# Patient Record
Sex: Female | Born: 1965 | Race: White | Hispanic: No | Smoking: Former smoker
Health system: Southern US, Community
[De-identification: ages and names within clinical notes are randomized; demographics above are authoritative.]

## PROBLEM LIST (undated history)

## (undated) DIAGNOSIS — I10 Essential (primary) hypertension: Secondary | ICD-10-CM

## (undated) DIAGNOSIS — R569 Unspecified convulsions: Secondary | ICD-10-CM

## (undated) DIAGNOSIS — E119 Type 2 diabetes mellitus without complications: Secondary | ICD-10-CM

## (undated) DIAGNOSIS — E785 Hyperlipidemia, unspecified: Secondary | ICD-10-CM

## (undated) HISTORY — PX: ABDOMINAL HYSTERECTOMY: SHX81

## (undated) HISTORY — DX: Type 2 diabetes mellitus without complications: E11.9

## (undated) HISTORY — PX: CHOLECYSTECTOMY: SHX55

---

## 2000-01-21 ENCOUNTER — Other Ambulatory Visit: Admission: RE | Admit: 2000-01-21 | Discharge: 2000-01-21 | Payer: Self-pay | Admitting: Obstetrics and Gynecology

## 2000-07-02 ENCOUNTER — Emergency Department (HOSPITAL_COMMUNITY): Admission: EM | Admit: 2000-07-02 | Discharge: 2000-07-02 | Payer: Self-pay | Admitting: Emergency Medicine

## 2000-07-02 ENCOUNTER — Encounter: Payer: Self-pay | Admitting: Emergency Medicine

## 2001-02-14 ENCOUNTER — Other Ambulatory Visit: Admission: RE | Admit: 2001-02-14 | Discharge: 2001-02-14 | Payer: Self-pay | Admitting: Obstetrics and Gynecology

## 2001-02-14 ENCOUNTER — Encounter (INDEPENDENT_AMBULATORY_CARE_PROVIDER_SITE_OTHER): Payer: Self-pay

## 2001-02-21 ENCOUNTER — Encounter: Payer: Self-pay | Admitting: Obstetrics and Gynecology

## 2001-02-26 ENCOUNTER — Ambulatory Visit (HOSPITAL_COMMUNITY): Admission: RE | Admit: 2001-02-26 | Discharge: 2001-02-26 | Payer: Self-pay | Admitting: Obstetrics and Gynecology

## 2002-02-26 ENCOUNTER — Other Ambulatory Visit: Admission: RE | Admit: 2002-02-26 | Discharge: 2002-02-26 | Payer: Self-pay | Admitting: Obstetrics and Gynecology

## 2003-02-07 ENCOUNTER — Encounter: Admission: RE | Admit: 2003-02-07 | Discharge: 2003-02-07 | Payer: Self-pay | Admitting: Gastroenterology

## 2003-02-07 ENCOUNTER — Encounter: Payer: Self-pay | Admitting: Gastroenterology

## 2003-03-25 ENCOUNTER — Other Ambulatory Visit: Admission: RE | Admit: 2003-03-25 | Discharge: 2003-03-25 | Payer: Self-pay | Admitting: Obstetrics and Gynecology

## 2003-07-22 ENCOUNTER — Ambulatory Visit (HOSPITAL_COMMUNITY): Admission: RE | Admit: 2003-07-22 | Discharge: 2003-07-22 | Payer: Self-pay | Admitting: Gastroenterology

## 2003-09-10 ENCOUNTER — Ambulatory Visit (HOSPITAL_COMMUNITY): Admission: RE | Admit: 2003-09-10 | Discharge: 2003-09-11 | Payer: Self-pay | Admitting: Surgery

## 2003-09-10 ENCOUNTER — Encounter (INDEPENDENT_AMBULATORY_CARE_PROVIDER_SITE_OTHER): Payer: Self-pay | Admitting: Specialist

## 2004-05-18 ENCOUNTER — Other Ambulatory Visit: Admission: RE | Admit: 2004-05-18 | Discharge: 2004-05-18 | Payer: Self-pay | Admitting: Obstetrics and Gynecology

## 2004-12-07 ENCOUNTER — Ambulatory Visit (HOSPITAL_COMMUNITY): Admission: RE | Admit: 2004-12-07 | Discharge: 2004-12-07 | Payer: Self-pay | Admitting: Gastroenterology

## 2005-01-27 ENCOUNTER — Emergency Department (HOSPITAL_COMMUNITY): Admission: EM | Admit: 2005-01-27 | Discharge: 2005-01-27 | Payer: Self-pay | Admitting: Emergency Medicine

## 2005-05-19 ENCOUNTER — Other Ambulatory Visit: Admission: RE | Admit: 2005-05-19 | Discharge: 2005-05-19 | Payer: Self-pay | Admitting: *Deleted

## 2006-04-10 ENCOUNTER — Encounter (INDEPENDENT_AMBULATORY_CARE_PROVIDER_SITE_OTHER): Payer: Self-pay | Admitting: *Deleted

## 2006-04-10 ENCOUNTER — Ambulatory Visit (HOSPITAL_COMMUNITY): Admission: RE | Admit: 2006-04-10 | Discharge: 2006-04-11 | Payer: Self-pay | Admitting: Obstetrics and Gynecology

## 2006-05-12 ENCOUNTER — Encounter: Admission: RE | Admit: 2006-05-12 | Discharge: 2006-05-12 | Payer: Self-pay | Admitting: Obstetrics and Gynecology

## 2007-04-20 ENCOUNTER — Emergency Department (HOSPITAL_COMMUNITY): Admission: EM | Admit: 2007-04-20 | Discharge: 2007-04-20 | Payer: Self-pay | Admitting: Emergency Medicine

## 2007-11-26 ENCOUNTER — Emergency Department (HOSPITAL_COMMUNITY): Admission: EM | Admit: 2007-11-26 | Discharge: 2007-11-26 | Payer: Self-pay | Admitting: Emergency Medicine

## 2008-04-28 ENCOUNTER — Other Ambulatory Visit: Admission: RE | Admit: 2008-04-28 | Discharge: 2008-04-28 | Payer: Self-pay | Admitting: Family Medicine

## 2009-01-22 ENCOUNTER — Emergency Department (HOSPITAL_COMMUNITY): Admission: EM | Admit: 2009-01-22 | Discharge: 2009-01-23 | Payer: Self-pay | Admitting: Emergency Medicine

## 2009-01-25 ENCOUNTER — Emergency Department (HOSPITAL_COMMUNITY): Admission: EM | Admit: 2009-01-25 | Discharge: 2009-01-26 | Payer: Self-pay | Admitting: Emergency Medicine

## 2009-04-23 ENCOUNTER — Emergency Department (HOSPITAL_COMMUNITY): Admission: EM | Admit: 2009-04-23 | Discharge: 2009-04-23 | Payer: Self-pay | Admitting: Emergency Medicine

## 2009-06-04 ENCOUNTER — Emergency Department (HOSPITAL_COMMUNITY): Admission: EM | Admit: 2009-06-04 | Discharge: 2009-06-05 | Payer: Self-pay | Admitting: Emergency Medicine

## 2009-09-08 ENCOUNTER — Emergency Department (HOSPITAL_COMMUNITY): Admission: EM | Admit: 2009-09-08 | Discharge: 2009-09-08 | Payer: Self-pay | Admitting: Emergency Medicine

## 2010-01-24 ENCOUNTER — Emergency Department (HOSPITAL_COMMUNITY): Admission: EM | Admit: 2010-01-24 | Discharge: 2010-01-25 | Payer: Self-pay | Admitting: Emergency Medicine

## 2010-08-06 ENCOUNTER — Inpatient Hospital Stay (HOSPITAL_COMMUNITY): Admission: EM | Admit: 2010-08-06 | Discharge: 2010-08-09 | Payer: Self-pay | Admitting: Emergency Medicine

## 2010-08-16 ENCOUNTER — Emergency Department (HOSPITAL_COMMUNITY)
Admission: AC | Admit: 2010-08-16 | Discharge: 2010-08-16 | Payer: Self-pay | Source: Home / Self Care | Admitting: Dermatology

## 2010-11-23 LAB — CBC
HCT: 33.9 % — ABNORMAL LOW (ref 36.0–46.0)
HCT: 34.6 % — ABNORMAL LOW (ref 36.0–46.0)
Hemoglobin: 11 g/dL — ABNORMAL LOW (ref 12.0–15.0)
Hemoglobin: 11.1 g/dL — ABNORMAL LOW (ref 12.0–15.0)
Hemoglobin: 11.5 g/dL — ABNORMAL LOW (ref 12.0–15.0)
MCH: 28.6 pg (ref 26.0–34.0)
MCH: 29.4 pg (ref 26.0–34.0)
MCHC: 32 g/dL (ref 30.0–36.0)
MCHC: 32.4 g/dL (ref 30.0–36.0)
MCHC: 33.2 g/dL (ref 30.0–36.0)
MCV: 88.5 fL (ref 78.0–100.0)
RBC: 3.76 MIL/uL — ABNORMAL LOW (ref 3.87–5.11)
RDW: 13.8 % (ref 11.5–15.5)
RDW: 13.9 % (ref 11.5–15.5)
WBC: 12 10*3/uL — ABNORMAL HIGH (ref 4.0–10.5)

## 2010-11-23 LAB — GLUCOSE, CAPILLARY: Glucose-Capillary: 115 mg/dL — ABNORMAL HIGH (ref 70–99)

## 2010-11-23 LAB — BASIC METABOLIC PANEL
BUN: 12 mg/dL (ref 6–23)
BUN: 4 mg/dL — ABNORMAL LOW (ref 6–23)
BUN: 7 mg/dL (ref 6–23)
CO2: 23 mEq/L (ref 19–32)
CO2: 25 mEq/L (ref 19–32)
Calcium: 8.7 mg/dL (ref 8.4–10.5)
Chloride: 107 mEq/L (ref 96–112)
Chloride: 107 mEq/L (ref 96–112)
Creatinine, Ser: 0.64 mg/dL (ref 0.4–1.2)
GFR calc non Af Amer: >60 mL/min (ref >60)
GFR calc non Af Amer: >60 mL/min (ref >60)
Glucose, Bld: 150 mg/dL — ABNORMAL HIGH (ref 70–99)
Glucose, Bld: 166 mg/dL — ABNORMAL HIGH (ref 70–99)
Potassium: 3.1 mEq/L — ABNORMAL LOW (ref 3.5–5.1)
Potassium: 3.7 mEq/L (ref 3.5–5.1)
Sodium: 136 mEq/L (ref 135–145)

## 2010-11-23 LAB — COMPREHENSIVE METABOLIC PANEL
ALT: 12 U/L (ref 0–35)
Alkaline Phosphatase: 74 U/L (ref 39–117)
CO2: 24 mEq/L (ref 19–32)
Chloride: 108 mEq/L (ref 96–112)
GFR calc non Af Amer: >60 mL/min (ref >60)
Glucose, Bld: 100 mg/dL — ABNORMAL HIGH (ref 70–99)
Potassium: 3.6 mEq/L (ref 3.5–5.1)
Sodium: 137 mEq/L (ref 135–145)
Total Bilirubin: 0.5 mg/dL (ref 0.3–1.2)

## 2010-11-23 LAB — MRSA PCR SCREENING: MRSA by PCR: NEGATIVE

## 2010-11-23 LAB — MAGNESIUM: Magnesium: 1.9 mg/dL (ref 1.5–2.5)

## 2010-12-18 LAB — POCT I-STAT, CHEM 8
BUN: 5 mg/dL — ABNORMAL LOW (ref 6–23)
Calcium, Ion: 1.1 mmol/L — ABNORMAL LOW (ref 1.12–1.32)
Hemoglobin: 11.9 g/dL — ABNORMAL LOW (ref 12.0–15.0)
Sodium: 138 mEq/L (ref 135–145)
TCO2: 20 mmol/L (ref 0–100)

## 2010-12-18 LAB — ETHANOL: Alcohol, Ethyl (B): <5 mg/dL (ref 0–10)

## 2010-12-21 LAB — COMPREHENSIVE METABOLIC PANEL
ALT: 22 U/L (ref 0–35)
Alkaline Phosphatase: 80 U/L (ref 39–117)
BUN: 4 mg/dL — ABNORMAL LOW (ref 6–23)
CO2: 25 mEq/L (ref 19–32)
Chloride: 107 mEq/L (ref 96–112)
GFR calc non Af Amer: >60 mL/min (ref >60)
Glucose, Bld: 126 mg/dL — ABNORMAL HIGH (ref 70–99)
Potassium: 3.4 mEq/L — ABNORMAL LOW (ref 3.5–5.1)
Sodium: 136 mEq/L (ref 135–145)
Total Bilirubin: 0.5 mg/dL (ref 0.3–1.2)

## 2010-12-21 LAB — BASIC METABOLIC PANEL
BUN: 6 mg/dL (ref 6–23)
Calcium: 8.5 mg/dL (ref 8.4–10.5)
Chloride: 107 mEq/L (ref 96–112)
Creatinine, Ser: 0.59 mg/dL (ref 0.4–1.2)

## 2010-12-21 LAB — URINALYSIS, ROUTINE W REFLEX MICROSCOPIC
Glucose, UA: NEGATIVE mg/dL
Hgb urine dipstick: NEGATIVE
Ketones, ur: NEGATIVE mg/dL
Protein, ur: NEGATIVE mg/dL
Urobilinogen, UA: 0.2 mg/dL (ref 0.0–1.0)

## 2010-12-21 LAB — PREGNANCY, URINE: Preg Test, Ur: NEGATIVE

## 2010-12-21 LAB — URINE CULTURE

## 2010-12-21 LAB — LACTIC ACID, PLASMA: Lactic Acid, Venous: 1.7 mmol/L (ref 0.5–2.2)

## 2010-12-21 LAB — CBC
HCT: 33.5 % — ABNORMAL LOW (ref 36.0–46.0)
Hemoglobin: 11.6 g/dL — ABNORMAL LOW (ref 12.0–15.0)
RBC: 3.91 MIL/uL (ref 3.87–5.11)

## 2010-12-21 LAB — DIFFERENTIAL
Basophils Absolute: 0.1 10*3/uL (ref 0.0–0.1)
Basophils Relative: 1 % (ref 0–1)
Eosinophils Absolute: 0.1 10*3/uL (ref 0.0–0.7)
Monocytes Relative: 5 % (ref 3–12)
Neutro Abs: 8.8 10*3/uL — ABNORMAL HIGH (ref 1.7–7.7)
Neutrophils Relative %: 82 % — ABNORMAL HIGH (ref 43–77)

## 2011-01-28 NOTE — Op Note (Signed)
[name], [name]                ACCOUNT NO.:  [id number]   MEDICAL RECORD NO.:  [id number]          PATIENT TYPE:  AMB   LOCATION:  ENDO                         FACILITY:  [facility]   PHYSICIAN:  James L. Malon Kindle., M.D.DATE OF BIRTH:  1965-09-17   DATE OF PROCEDURE:  12/07/2004  DATE OF DISCHARGE:                                 OPERATIVE REPORT   PROCEDURE:  Esophagogastroduodenoscopy.   ENDOSCOPIST:  Llana Aliment. Randa Evens, M.D.   MEDICATIONS:  Cetacaine spray, fentanyl 60 mcg, Versed 7 mg IV.   INDICATION:  Persistent dyspepsia.   DESCRIPTION OF PROCEDURE:  The procedure had been explained to the patient  and consent obtained.  With the patient in the left lateral decubitus  position, the Olympus scope was inserted and advanced.  The stomach was  entered and pylorus identified and passed.  The duodenum including the bulb  and second portion were seen well and were unremarkable.  The scope was  withdrawn back into the stomach and the pyloric channel was normal.  The  antrum and body of the stomach were normal with no ulceration or  inflammation.  The fundus and cardia were seen on the retroflexed view and  appeared to be normal.  The patient did have a small hiatal hernia.  The GE  junction was patent with a reddened distal esophagus.  The proximal  esophagus was endoscopically normal.  The patient tolerated the procedure  well.   ASSESSMENT:  Probable gastroesophageal reflux disease.  The patient's  bloating may be due to delayed emptying.   PLAN:  We will give her a trial of Reglan 10 mg b.i.d.  We will continue the  Nexium due to reflux.  She will be seen back in the office in 6-8 weeks.      JLE/MEDQ  D:  12/07/2004  T:  12/07/2004  Job:  [id number]   cc:   Dario Guardian, M.D.  [address]  [place]  [place] [address]  Fax: [phone]

## 2011-01-28 NOTE — Op Note (Signed)
[name], [name]                ACCOUNT NO.:  [id number]   MEDICAL RECORD NO.:  [id number]          PATIENT TYPE:  OIB   LOCATION:  9311                          FACILITY:  [facility]   PHYSICIAN:  Cynthia P. Romine, M.D.DATE OF BIRTH:  1966/07/17   DATE OF PROCEDURE:  04/10/2006  DATE OF DISCHARGE:                                 OPERATIVE REPORT   PREOPERATIVE DIAGNOSIS:  Menorrhagia, left ovarian cyst.   POSTOPERATIVE DIAGNOSIS:  Menorrhagia, left ovarian cyst, pathology pending.   PROCEDURE:  Total vaginal hysterectomy, left ovarian cystectomy   SURGEON:  Dr. Arline Asp Romine.   ASSISTANT:  Dr. Leda Quail.   ANESTHESIA:  General by LMA.   ESTIMATED BLOOD LOSS:  150 mL.   COMPLICATIONS:  None.   PROCEDURE:  The patient was taken to the operating room and after induction  of adequate general anesthesia was placed in dorsal lithotomy position and  prepped and draped in usual fashion.  Posterior weighted and an anterior  Deaver were placed, and the cervix was grasped on its anterior lip with a  Jacobs tenaculum.  Mucosa over the cervix was infiltrated with 1% lidocaine  with epinephrine.  Mucosa was then incised from 9 to 3 o'clock and dissected  sharply off the cervix.  The bladder was pushed anteriorly.  Attention was  then turned posteriorly.  A posterior colpotomy incision was made and the  banana retractor was placed.  Uterosacral ligaments were clamped, cut and  tied on each side.  The hysterectomy continued up the cardinal ligaments,  clamping, cutting and tying on each side using zero Vicryl.  Attention was  then turned anteriorly.  The peritoneum was identified, elevated and entered  atraumatically and the Deaver was placed in the intraperitoneal space.  The  pedicles containing the uterine arteries were then clamped on each side, cut  and doubly tied with 0 Vicryl.  Hysterectomy continued up the broad  ligament, clamping, cutting and tying in sequence.  The fundus was  then  delivered into the posterior cul-de-sac, and the pedicle containing the  tube, the round and utero-ovarian ligaments were clamped, cut and doubly  tied, first with a free tie and then with a fore-and-aft suture of 0 Vicryl.  The specimen was sent to pathology.  The ovaries were inspected.  The right  tube and ovary appeared normal.  The left tube was normal on the left ovary.  There had been a persistent ovarian cyst for at least a year that measured  about 2.3 cm on ultrasound.  This was visualized and appeared clear and  thinned wall.  The cyst was opened with a small puncture wound with a knife.  The cyst wall was dissected off the cortex of the ovary and sent to  pathology.  The cyst was consistent with approximately 2.3 cm as seen on  ultrasound.  After removing the cyst wall, there was no bleeding from the  bed of the cyst or on the ovarian cortex.  Therefore, no sutures were  placed.  The pedicles were inspected and found to be hemostatic.  The  sutures  were cut, and the ovaries were allowed to retract.  The vaginal cuff  was run with a running locking suture of 0 Vicryl going from 2 to 10  o'clock.  An anti-enterocele stitch was placed by going into the posterior  peritoneum, suturing the left uterosacral ligaments, skimming across the  posterior peritoneum, between the right uterosacral ligaments and coming out  again in the midline and tying these two together.  Wound was irrigated.  All the pedicles were inspected and were hemostatic.  The cuff was closed  with figure-of-eight sutures of 0 Vicryl.  The cuff was  irrigated.  Bladder was drained.  Instruments were removed from the vagina,  and the procedure was terminated.  The patient tolerated it well.  Sponge,  needle and instrument counts were correct x3.  The patient was taken to the  recovery room in satisfactory condition.           ______________________________  Edwena Felty. Romine, M.D.     CPR/MEDQ  D:   04/10/2006  T:  04/11/2006  Job:  [id number]

## 2011-01-28 NOTE — Op Note (Signed)
[facility]  Patient:    [name], [name]                       MRN: [id number] Proc. Date: 02/26/01 Adm. Date:  [id number] Attending:  Brynda Peon                           Operative Report  PREOPERATIVE DIAGNOSIS:  Multiparity with desire for attempt at permanent surgical sterilization.  POSTOPERATIVE DIAGNOSIS:  Multiparity with desire for attempt at permanent surgical sterilization.  PROCEDURE:  Laparoscopic Falope-ring bilateral tubal sterilization.  SURGEON:  Cynthia P. Ashley Royalty, M.D.  ANESTHESIA:  General endotracheal.  ESTIMATED BLOOD LOSS:  Minimal.  COMPLICATIONS:  None.  DESCRIPTION OF PROCEDURE:  The patient was taken to the operating room and after the induction of adequate general endotracheal anesthesia, was placed in the dorsal lithotomy position and prepped and draped in the usual fashion. The bladder was drained with a red rubber catheter.  A Hulka uterine manipulator was placed.  A subumbilical incision was made, and the Veress needle was inserted into the peritoneal space.  Proper placement was tested by noting a negative aspirate and then free flow of saline through the Veress needle, again with a negative aspirate and then by noting the response of a drop of saline placed at the hub of the Veress needle to negative pressure as the abdominal wall was elevated.  Pneumoperitoneum was created with 2.4 liters of CO2 using the automatic insufflator.  A disposable 10-11 mm trocar was inserted into the peritoneal space and its proper placement noted with the laparoscope.  The pelvis was inspected.  There was a question of endometriosis preoperatively because of the patients family history and because she had been having significant dysmenorrhea and intermenstrual bleeding; however, no endometriosis was noted in the pelvis.  The tubes, ovaries, and uterus all appeared normal, as did the anterior and posterior cul-de-sac.  A  small suprapubic incision was made, and the 8 mm trocar was then inserted under direct visualization.  The right fallopian tube was identified and traced to its fimbriated end.  A ______  portion was elevated, and a Falope-ring was placed.  A good knuckle of tube was noted to be contained within the ring, and no bleeding was encountered.  The procedure was repeated on the patients left, identifying the tube and tracing it to its fimbriated end.  A ______ portion was elevated, and a Falope-ring was placed.  A good knuckle of tube was noted to be contained within the ring, and no bleeding was encountered. Good blanching was noted.  After inspecting the tubes again and finding proper placement of the rings, the instruments removed from the abdomen, and the pneumoperitoneum was allowed to escape, and the incisions were closed subcuticularly with 3-0 plain gut.  The instruments were removed from the vagina, and the procedure was terminated.  The patient tolerated it well and went in satisfactory condition to postanesthesia recovery. DD:  02/26/01 TD:  02/26/01 Job: [id number]

## 2011-01-28 NOTE — Discharge Summary (Signed)
[name], [name]                ACCOUNT NO.:  [id number]   MEDICAL RECORD NO.:  [id number]          PATIENT TYPE:  OIB   LOCATION:  9311                          FACILITY:  [facility]   PHYSICIAN:  Cynthia P. Romine, M.D.DATE OF BIRTH:  October 16, 1965   DATE OF ADMISSION:  04/10/2006  DATE OF DISCHARGE:  04/11/2006                                 DISCHARGE SUMMARY   DISCHARGE DIAGNOSIS:  Leiomyoma and endometriosis.   HISTORY:  This is a 45 year old married white female, gravida 2, para 2 with  bleeding that was prolonged throughout the month and severe dysmenorrhea.  Her ultrasound showed her uterus to be 11 x 9 x 6 cm.  Her right ovary was  normal.  Her left ovary had a 2.3 cm clear cyst.  She tried medical  management but had been unsuccessful and she was admitted to undergo a  vaginal hysterectomy.  On April 10, 2006, she underwent a total vaginal  hysterectomy and a left ovarian cystectomy, estimated blood loss was 150 mL.  There were no complications.  Postoperatively she did very well.  She was  afebrile and in stable condition for discharge on postoperative day #1.  She  was given full discharge instructions regarding pelvic rest and follow-up in  the office.  Pathology did show a 2.5 cm leiomyoma and endometriosis on the  serosal surface of the uterus.  The left ovarian cyst was a benign cyst with  features with a follicular cyst.   LABORATORY DATA:  Admission hemoglobin and hematocrit were 11 and 34.  On  discharge 10.8 and 32.7.           ______________________________  Edwena Felty. Romine, M.D.     CPR/MEDQ  D:  05/08/2006  T:  05/09/2006  Job:  [id number]

## 2011-01-28 NOTE — Op Note (Signed)
NAME:  [name], [name]                          ACCOUNT NO.:  [id number]   MEDICAL RECORD NO.:  [id number]                   PATIENT TYPE:  OIB   LOCATION:  2550                                 FACILITY:  [facility]   PHYSICIAN:  Thornton Park. Daphine Deutscher, M.D.             DATE OF BIRTH:  21-Feb-1966   DATE OF PROCEDURE:  09/10/2003  DATE OF DISCHARGE:                                 OPERATIVE REPORT   PREOPERATIVE DIAGNOSIS:  Biliary dyskinesia.   POSTOPERATIVE DIAGNOSIS:  Biliary dyskinesia.   OPERATION PERFORMED:  Laparoscopic cholecystectomy with intraoperative  cholangiogram.   SURGEON:  Thornton Park. Daphine Deutscher, M.D.   ANESTHESIA:  General endotracheal.   INDICATIONS FOR PROCEDURE:  [name] is a 45 year old lady with  recurrent bouts of midepigastric pain and nausea.   DESCRIPTION OF PROCEDURE:  She was taken to room 17 and given general  anesthesia.  The abdomen was prepped with Betadine and draped sterilely.  A  longitudinal incision was made down in the umbilicus and there I found a  small little umbilical hernia which I used to open inferiorly to create a  space for the Hasson cannula.  The abdomen was insufflated and three trocars  were placed in the upper abdomen.  At the end of the case, these were all  infiltrated with 0.5% Marcaine.  The gallbladder was kind of a robin's egg  blue but it had little areas that could well be cholesterolosis that were  visible through the wall.  It was grasped and elevated and grasped on the  infundibulum and dissected out of Calot's triangle.  I delineated the cystic  artery and cystic duct and put a clip up on the gallbladder and incised the  cystic duct and did a dynamic cholangiogram which showed a long skinny  cystic duct going into the common bile duct with retrograde filling up into  the liver and free flow into the duodenum.  For this I did use noniodinated  contrast material.  The patient seemed to tolerate this well.  The cystic  duct was  then triple clipped, divided.  The cystic artery was double clipped  and divided.  The gallbladder was removed from the gallbladder bed with the  hook cautery without entering it.  No bleeding or bile leaks were noted from  the gallbladder bed.  Optics for this with a 0 degree camera were okay but  not as crisp as usual.  The gallbladder was removed.  The gallbladder bed  was reinspected and no bleeding or bile leaks were noted.  On one view in  the pelvis with the 0 degree scope, it appeared she possibly might have an  early right inguinal hernia.  The umbilical defect was repaired with 0  Vicryl under laparoscopic vision and this seemed to obliterate that defect.  Port sites were injected with 0.5%  Marcaine.  There was some bleeding in the medial 5 mm  port and this was  controlled with electrocautery and a suture.  Benzoin and Steri-Strips were  used for the skin.  The patient tolerated the procedure well and was taken  to the recovery room in satisfactory condition.                                               Thornton Park Daphine Deutscher, M.D.    MBM/MEDQ  D:  09/10/2003  T:  09/10/2003  Job:  [id number]   cc:   Dario Guardian, M.D.  [address]  [place]  [place] [address]  Fax: [phone]   Llana Aliment. Malon Kindle., M.D.  [address], [address]  [place]  [place] [address]  Fax: [phone]

## 2011-06-27 LAB — DIFFERENTIAL
Basophils Relative: 0
Eosinophils Absolute: 0.1
Lymphs Abs: 1.3
Neutro Abs: 12.2 — ABNORMAL HIGH
Neutrophils Relative %: 86 — ABNORMAL HIGH

## 2011-06-27 LAB — I-STAT 8, (EC8 V) (CONVERTED LAB)
Bicarbonate: 28.2 — ABNORMAL HIGH
Glucose, Bld: 114 — ABNORMAL HIGH
TCO2: 30
pH, Ven: 7.371 — ABNORMAL HIGH

## 2011-06-27 LAB — CBC
MCV: 80.1
Platelets: 422 — ABNORMAL HIGH
WBC: 14.2 — ABNORMAL HIGH

## 2011-06-27 LAB — LIPASE, BLOOD: Lipase: 19

## 2011-06-27 LAB — POCT I-STAT CREATININE: Operator id: 294501

## 2011-10-20 ENCOUNTER — Emergency Department (HOSPITAL_COMMUNITY)
Admission: EM | Admit: 2011-10-20 | Discharge: 2011-10-21 | Disposition: A | Discharge status: Home / Self Care | Payer: Self-pay | Attending: Emergency Medicine | Admitting: Emergency Medicine

## 2011-10-20 ENCOUNTER — Emergency Department (HOSPITAL_COMMUNITY): Payer: Self-pay

## 2011-10-20 ENCOUNTER — Other Ambulatory Visit: Payer: Self-pay

## 2011-10-20 ENCOUNTER — Encounter (HOSPITAL_COMMUNITY): Payer: Self-pay | Admitting: Emergency Medicine

## 2011-10-20 DIAGNOSIS — R404 Transient alteration of awareness: Secondary | ICD-10-CM | POA: Insufficient documentation

## 2011-10-20 DIAGNOSIS — Z79899 Other long term (current) drug therapy: Secondary | ICD-10-CM | POA: Insufficient documentation

## 2011-10-20 DIAGNOSIS — R32 Unspecified urinary incontinence: Secondary | ICD-10-CM | POA: Insufficient documentation

## 2011-10-20 DIAGNOSIS — R4182 Altered mental status, unspecified: Secondary | ICD-10-CM | POA: Insufficient documentation

## 2011-10-20 DIAGNOSIS — G40909 Epilepsy, unspecified, not intractable, without status epilepticus: Secondary | ICD-10-CM | POA: Insufficient documentation

## 2011-10-20 DIAGNOSIS — J45909 Unspecified asthma, uncomplicated: Secondary | ICD-10-CM | POA: Insufficient documentation

## 2011-10-20 DIAGNOSIS — F29 Unspecified psychosis not due to a substance or known physiological condition: Secondary | ICD-10-CM | POA: Insufficient documentation

## 2011-10-20 HISTORY — DX: Unspecified convulsions: R56.9

## 2011-10-20 LAB — URINALYSIS, ROUTINE W REFLEX MICROSCOPIC
Glucose, UA: NEGATIVE mg/dL
Leukocytes, UA: NEGATIVE
pH: 5 (ref 5.0–8.0)

## 2011-10-20 LAB — RAPID URINE DRUG SCREEN, HOSP PERFORMED
Amphetamines: NOT DETECTED
Benzodiazepines: NOT DETECTED
Cocaine: POSITIVE — AB
Opiates: NOT DETECTED
Tetrahydrocannabinol: POSITIVE — AB

## 2011-10-20 LAB — BASIC METABOLIC PANEL
BUN: 5 mg/dL — ABNORMAL LOW (ref 6–23)
CO2: 22 mEq/L (ref 19–32)
GFR calc non Af Amer: >90 mL/min (ref >90)
Glucose, Bld: 116 mg/dL — ABNORMAL HIGH (ref 70–99)
Potassium: 3.9 mEq/L (ref 3.5–5.1)
Sodium: 135 mEq/L (ref 135–145)

## 2011-10-20 MED ORDER — SODIUM CHLORIDE 0.9 % IV SOLN
1000.0000 mg | INTRAVENOUS | Status: AC
  Administered 2011-10-20: 1000 mg via INTRAVENOUS
  Filled 2011-10-20: qty 10

## 2011-10-20 MED ORDER — SODIUM CHLORIDE 0.9 % IV BOLUS (SEPSIS)
1000.0000 mL | Freq: Once | INTRAVENOUS | Status: AC
  Administered 2011-10-20: 1000 mL via INTRAVENOUS

## 2011-10-20 MED ORDER — LEVETIRACETAM 500 MG PO TABS: 500.0000 mg | ORAL_TABLET | Freq: Two times a day (BID) | ORAL | Status: DC

## 2011-10-20 NOTE — ED Provider Notes (Signed)
History     CSN: [id number]  Arrival date & time 10/20/11  2045   First MD Initiated Contact with Patient 10/20/11 2048      Chief Complaint  Patient presents with  . Seizures    (Consider location/radiation/quality/duration/timing/severity/associated sxs/prior treatment) HPI Comments: Patient presents via EMS after having a seizure at a bar with a friend. She says she only drank 2 beers tonight. She does have a history of seizure disorder is on Keppra. She states she'll take half her dose last night. She did have tongue biting urinary incontinence. She is somewhat confused but answering questions appropriately. She denies any headache, neck stiffness, fever, chest pain or shortness of breath.  Patient is a 46 y.o. female presenting with seizures.  Seizures  This is a chronic problem. The current episode started less than 1 hour ago. The problem has been resolved. There was 1 seizure. The most recent episode lasted less than 30 seconds. Associated symptoms include confusion. Pertinent negatives include no neck stiffness, no chest pain, no cough and no vomiting. Characteristics include bladder incontinence, loss of consciousness and bit tongue. The episode was witnessed. There was no sensation of an aura present. The seizures did not continue in the ED. Possible causes include missed seizure meds. There has been no fever.    Past Medical History  Diagnosis Date  . Seizures   . Asthma     History reviewed. No pertinent past surgical history.  History reviewed. No pertinent family history.  History  Substance Use Topics  . Smoking status: Never Smoker   . Smokeless tobacco: Not on file  . Alcohol Use: Yes     beer    OB History    Grav Para Term Preterm Abortions TAB SAB Ect Mult Living                  Review of Systems  Unable to perform ROS: Mental status change  Respiratory: Negative for cough.   Cardiovascular: Negative for chest pain.  Gastrointestinal: Negative  for vomiting.  Genitourinary: Positive for bladder incontinence.  Neurological: Positive for seizures and loss of consciousness.  Psychiatric/Behavioral: Positive for confusion.    Allergies  Iohexol and Other  Home Medications   Current Outpatient Rx  Name Route Sig Dispense Refill  . ALBUTEROL SULFATE HFA 108 (90 BASE) MCG/ACT IN AERS Inhalation Inhale 2 puffs into the lungs every 6 (six) hours as needed. For shortness of breath    . EPINEPHRINE 0.15 MG/0.3ML IJ DEVI Intramuscular Inject 0.15 mg into the muscle as needed. For allergic reaction    . LANSOPRAZOLE 15 MG PO CPDR Oral Take 15 mg by mouth daily.    Marland Kitchen LEVETIRACETAM 500 MG PO TABS Oral Take 500 mg by mouth 2 (two) times daily.    Marland Kitchen LISINOPRIL-HYDROCHLOROTHIAZIDE 20-12.5 MG PO TABS Oral Take 1 tablet by mouth daily.    Marland Kitchen OVER THE COUNTER MEDICATION Oral Take 1 tablet by mouth daily. airborn vitamin c    . PRAVASTATIN SODIUM 40 MG PO TABS Oral Take 40 mg by mouth daily.    . SERTRALINE HCL 100 MG PO TABS Oral Take 100 mg by mouth daily.    Marland Kitchen LEVETIRACETAM 500 MG PO TABS Oral Take 1 tablet (500 mg total) by mouth every 12 (twelve) hours. 60 tablet 0    BP 132/73  Pulse 79  Temp(Src) 98.8 F (37.1 C) (Oral)  Resp 18  SpO2 97%  Physical Exam  Constitutional: She appears well-developed and well-nourished.  No distress.       Somnolent but arousable, protecting airway answers questions appropriately  HENT:  Head: Normocephalic and atraumatic.  Mouth/Throat: Oropharynx is clear and moist. No oropharyngeal exudate.       Positive tongue biting  Eyes: Conjunctivae are normal. Pupils are equal, round, and reactive to light.  Neck: Normal range of motion. Neck supple.       No meningismus  Cardiovascular: Normal rate, regular rhythm and normal heart sounds.   Pulmonary/Chest: Effort normal and breath sounds normal. No respiratory distress.  Abdominal: Soft. Bowel sounds are normal. There is no tenderness. There is no rebound  and no guarding.  Musculoskeletal: Normal range of motion. She exhibits no edema and no tenderness.  Neurological: She is alert. No cranial nerve deficit.       Cranial nerve 2-12 intact.  5/5 strength throughout.  Follows commands appropriately.  Skin: Skin is warm and dry.    ED Course  Procedures (including critical care time)  Labs Reviewed  BASIC METABOLIC PANEL - Abnormal; Notable for the following:    Glucose, Bld 116 (*)    BUN 5 (*)    All other components within normal limits  URINALYSIS, ROUTINE W REFLEX MICROSCOPIC - Abnormal; Notable for the following:    Hgb urine dipstick SMALL (*)    Protein, ur 30 (*)    All other components within normal limits  URINE RAPID DRUG SCREEN (HOSP PERFORMED) - Abnormal; Notable for the following:    Cocaine POSITIVE (*)    Tetrahydrocannabinol POSITIVE (*)    All other components within normal limits  ETHANOL  URINE MICROSCOPIC-ADD ON   Ct Head Wo Contrast  10/20/2011  *RADIOLOGY REPORT*  Clinical Data:  Seizures  CT HEAD WITHOUT CONTRAST  Technique:  Contiguous axial images were obtained from the base of the skull through the vertex without contrast.  Comparison: 04/23/2009  Findings: There is no evidence of acute intracranial hemorrhage, brain edema, mass lesion, acute infarction,   mass effect, or midline shift. Acute infarct may be inapparent on noncontrast CT. No other intra-axial abnormalities are seen, and the ventricles and sulci are within normal limits in size and symmetry.   No abnormal extra-axial fluid collections or masses are identified.  No significant calvarial abnormality.  IMPRESSION: 1. Negative for bleed or other acute intracranial process.  Original Report Authenticated By: Thora Lance III, M.D.     1. Seizure disorder       MDM  Seizure activity with alcohol abuse and history of seizures. Noncompliance with Keppra Nonfocal neuro exam patient somewhat postictal.   Date: 10/20/2011  Rate: 96  Rhythm:  normal sinus rhythm  QRS Axis: normal  Intervals: normal  ST/T Wave abnormalities: normal  Conduction Disutrbances:none  Narrative Interpretation:   Old EKG Reviewed: unchanged  Patient alert and oriented reassessed 11:30 PM. She has a normal neurological exam. She is requesting to go home. She was slow with 1 g of Keppra. Will provide her with a prescription.       Glynn Octave, MD 10/21/11 [phone]

## 2011-10-20 NOTE — ED Notes (Signed)
Patient at a bar with friend, started having seizure.  Patient does have history of seizures, she was postictal with EMS.  Patient only takes have her dosage of Keppra.  Patient does have ETOH on board.  Patient is now CAOx3, tired.

## 2011-10-21 NOTE — ED Notes (Signed)
IV taken out per RN

## 2013-05-29 ENCOUNTER — Other Ambulatory Visit: Payer: Self-pay

## 2013-05-29 MED ORDER — LEVETIRACETAM 500 MG PO TABS: ORAL_TABLET | ORAL | Status: DC

## 2013-06-01 ENCOUNTER — Ambulatory Visit: Payer: Self-pay | Admitting: Physician Assistant

## 2013-06-01 VITALS — BP 136/100 | HR 86 | Temp 98.2°F | Resp 16 | Ht 67.75 in | Wt 186.2 lb

## 2013-06-01 DIAGNOSIS — J45901 Unspecified asthma with (acute) exacerbation: Secondary | ICD-10-CM

## 2013-06-01 DIAGNOSIS — R062 Wheezing: Secondary | ICD-10-CM

## 2013-06-01 DIAGNOSIS — J069 Acute upper respiratory infection, unspecified: Secondary | ICD-10-CM

## 2013-06-01 MED ORDER — ALBUTEROL SULFATE (2.5 MG/3ML) 0.083% IN NEBU
2.5000 mg | INHALATION_SOLUTION | Freq: Once | RESPIRATORY_TRACT | Status: AC
  Administered 2013-06-01: 2.5 mg via RESPIRATORY_TRACT

## 2013-06-01 MED ORDER — PREDNISONE 20 MG PO TABS: ORAL_TABLET | ORAL | Status: DC

## 2013-06-01 MED ORDER — AZITHROMYCIN 250 MG PO TABS: ORAL_TABLET | ORAL | Status: DC

## 2013-06-01 MED ORDER — ALBUTEROL SULFATE HFA 108 (90 BASE) MCG/ACT IN AERS: 2.0000 | INHALATION_SPRAY | RESPIRATORY_TRACT | Status: DC | PRN

## 2013-06-01 NOTE — Progress Notes (Signed)
Patient ID: [name] MRN: [id number], DOB: 1966/02/02, 47 y.o. Date of Encounter: 06/01/2013, 8:42 AM  Primary Physician: Allean Found, MD  Chief Complaint: Wheezing, SOB, nasal congestion, cough, x 2 days and asthma flare up x 1 day  HPI: 47 y.o. female with history below presents with 2 day history of URI symptoms consisting of nasal congestion, rhinorrhea, sinus pressure, and non productive cough. Subjective fever and chills. Symptoms of SOB and wheezing began the previous day. She states that she wanted to come in before her asthma got too bad. Breathing is a 7/10 and dipped to a 4/10. Has not been using her albuterol inhaler secondary to financial issues; however previous to her URI her asthma is well controlled at baseline. She states that she cannot remember when her last exacerbation was. It has been several years ago. She has been without albuterol for 4 years. She does request that I write this for her today. No current tobacco use. No chest pain, palpitations, edema, or syncope.    Past Medical History  Diagnosis Date  . Seizures   . Asthma      Home Meds: Prior to Admission medications   Medication Sig Start Date End Date Taking? Authorizing Provider  levETIRAcetam (KEPPRA) 500 MG tablet One tablet in the morning and two tablets at bedtime 05/29/13  Yes Melvyn Novas, MD  albuterol (PROVENTIL HFA;VENTOLIN HFA) 108 (90 BASE) MCG/ACT inhaler Inhale 2 puffs into the lungs every 6 (six) hours as needed. For shortness of breath   No Historical Provider, MD  EPINEPHrine (EPIPEN JR) 0.15 MG/0.3ML injection Inject 0.15 mg into the muscle as needed. For allergic reaction   No Historical Provider, MD  lansoprazole (PREVACID) 15 MG capsule Take 15 mg by mouth daily.   No Historical Provider, MD  lisinopril-hydrochlorothiazide (PRINZIDE,ZESTORETIC) 20-12.5 MG per tablet Take 1 tablet by mouth daily.   No Historical Provider, MD  OVER THE COUNTER MEDICATION Take 1 tablet by  mouth daily. airborn vitamin c   No Historical Provider, MD  pravastatin (PRAVACHOL) 40 MG tablet Take 40 mg by mouth daily.   No Historical Provider, MD  sertraline (ZOLOFT) 100 MG tablet Take 100 mg by mouth daily.   No Historical Provider, MD    Allergies:  Allergies  Allergen Reactions  . Iohexol      Desc: UNABLE TO BREATH   . Other Other (See Comments)    Cashews and pistachios; reaction unknown    History   Social History  . Marital Status: Widowed    Spouse Name: N/A    Number of Children: N/A  . Years of Education: N/A   Occupational History  . Not on file.   Social History Main Topics  . Smoking status: Former Games developer  . Smokeless tobacco: Not on file  . Alcohol Use: Yes     Comment: beer  . Drug Use: Not on file  . Sexual Activity: Not on file   Other Topics Concern  . Not on file   Social History Narrative  . No narrative on file     Review of Systems: Constitutional: see above  HEENT: see above Cardiovascular: negative for chest pain or palpitations Respiratory: positive for cough, wheezing, and SOB. negative for hemoptysis, or chest pain Abdominal: positive for nausea and diarrhea. negative for abdominal pain, vomiting, or constipation Dermatological: negative for rash Neurologic: negative for headache, dizziness, or syncope   Physical Exam: Blood pressure 136/100, pulse 86, temperature 98.2 F (36.8 C), temperature source  Oral, resp. rate 16, height 5' 7.75" (1.721 m), weight 186 lb 3.2 oz (84.46 kg), SpO2 98.00%., Body mass index is 28.52 kg/(m^2). General: Well developed, well nourished, in no acute distress. Head: Normocephalic, atraumatic, eyes without discharge, sclera non-icteric, nares are without discharge. Bilateral auditory canals clear, TM's are without perforation, pearly grey and translucent with reflective cone of light bilaterally. Oral cavity moist, posterior pharynx without exudate, erythema, peritonsillar abscess, or post nasal  drip.  Neck: Supple. No thyromegaly. Full ROM. No lymphadenopathy. Lungs: Expiratory wheezing bilaterally. No rales or rhonchi. Breathing is unlabored. Heart: RRR with S1 S2. No murmurs, rubs, or gallops appreciated. Msk:  Strength and tone normal for age. Extremities/Skin: Warm and dry. No clubbing or cyanosis. No edema. No rashes or suspicious lesions. Neuro: Alert and oriented X 3. Moves all extremities spontaneously. Gait is normal. CNII-XII grossly in tact. Psych:  Responds to questions appropriately with a normal affect.   S/P 1st Albuterol neb: breathing improved to an 8/10. S/P 2nd Albuterol neb: breathing much improved to baseline   ASSESSMENT AND PLAN:  47 y.o. female with acute asthma exacerbation and URI -Albuterol nebs per above -Azithromycin 250 MG #6 2 po first day then 1 po next 4 days no RF -Proventil 2 puffs inhaled q 4-6 hours prn #1 RF 2 -Prednisone 20 mg #18 3x3, 2x3, 1x3 no RF -Continue Mucinex -Rest -Fluids -RTC/ER precautions  Signed, Eula Listen, PA-C 06/01/2013 8:42 AM

## 2013-07-15 ENCOUNTER — Other Ambulatory Visit: Payer: Self-pay | Admitting: Neurology

## 2013-07-18 ENCOUNTER — Other Ambulatory Visit: Payer: Self-pay | Admitting: Neurology

## 2013-07-18 MED ORDER — LEVETIRACETAM 500 MG PO TABS: ORAL_TABLET | ORAL | Status: DC

## 2013-07-24 ENCOUNTER — Ambulatory Visit (INDEPENDENT_AMBULATORY_CARE_PROVIDER_SITE_OTHER): Payer: Self-pay | Admitting: Nurse Practitioner

## 2013-07-24 ENCOUNTER — Encounter: Payer: Self-pay | Admitting: Nurse Practitioner

## 2013-07-24 VITALS — BP 159/111 | HR 68 | Ht 68.0 in | Wt 182.0 lb

## 2013-07-24 DIAGNOSIS — G40309 Generalized idiopathic epilepsy and epileptic syndromes, not intractable, without status epilepticus: Secondary | ICD-10-CM

## 2013-07-24 MED ORDER — LEVETIRACETAM 500 MG PO TABS: ORAL_TABLET | ORAL | Status: DC

## 2013-07-24 NOTE — Patient Instructions (Signed)
Continue  Keppra 1500 mg daily Will renew for one year Followup yearly and when necessary

## 2013-07-24 NOTE — Progress Notes (Signed)
I agree with the assessment and plan as directed by NP .The patient is known to me .   Jamar Weatherall, MD  
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
## 2013-11-13 ENCOUNTER — Other Ambulatory Visit: Payer: Self-pay | Admitting: Obstetrics and Gynecology

## 2013-11-13 DIAGNOSIS — Z1231 Encounter for screening mammogram for malignant neoplasm of breast: Secondary | ICD-10-CM

## 2013-11-15 ENCOUNTER — Encounter (HOSPITAL_COMMUNITY): Payer: Self-pay

## 2013-11-19 ENCOUNTER — Ambulatory Visit (HOSPITAL_COMMUNITY)
Admission: RE | Admit: 2013-11-19 | Discharge: 2013-11-19 | Disposition: A | Discharge status: Home / Self Care | Payer: Self-pay | Source: Ambulatory Visit | Attending: Obstetrics and Gynecology | Admitting: Obstetrics and Gynecology

## 2013-11-19 ENCOUNTER — Encounter (HOSPITAL_COMMUNITY): Payer: Self-pay

## 2013-11-19 VITALS — BP 164/105 | Temp 98.1°F | Ht 67.0 in | Wt 178.4 lb

## 2013-11-19 DIAGNOSIS — Z1239 Encounter for other screening for malignant neoplasm of breast: Secondary | ICD-10-CM

## 2013-11-19 DIAGNOSIS — Z1231 Encounter for screening mammogram for malignant neoplasm of breast: Secondary | ICD-10-CM

## 2013-11-19 DIAGNOSIS — R87622 Low grade squamous intraepithelial lesion on cytologic smear of vagina (LGSIL): Secondary | ICD-10-CM

## 2013-11-19 HISTORY — DX: Essential (primary) hypertension: I10

## 2013-11-19 HISTORY — DX: Hyperlipidemia, unspecified: E78.5

## 2013-11-19 NOTE — Patient Instructions (Signed)
Taught Roberts GaudyDonna K Osinski how to perform BSE and gave educational materials to take home. Patient did not need a Pap smear today due to last Pap smear was 10/14/2013. [facility] for a colposcopy to follow-up for abnormal Pap smear. Appointment scheduled for Thursday, December 26, 2013 at 1315. Patient aware of appointment and will be there. Roberts Gaudyonna K Coia verbalized understanding. Patient escorted to mammography for a screening mammogram.  Tereka Thorley, Kathaleen Maserhristine Poll, RN 3:07 PM

## 2013-11-19 NOTE — Progress Notes (Signed)
Patient referred to [facility] due to having an abnormal Pap smear on 10/14/2013 that a colposcopy is recommended for follow-up.  Pap Smear:  Pap smear not completed today. Last Pap smear was 10/14/2013 at the free [facility] screening [facility]. Referred [facility] to [facility] for a colposcopy to follow-up for abnormal Pap smear. Appointment scheduled for Thursday, December 26, 2013 at 1315. Per patient has no history of abnormal Pap smears prior to the most recent Pap smear. Patient has a history of a hysterectomy due to endometriosis. Pap smear result is scanned in EPIC under media.  Physical exam: Breasts Breasts symmetrical. No skin abnormalities bilateral breasts. No nipple retraction bilateral breasts. No nipple discharge bilateral breasts. No lymphadenopathy. No lumps palpated bilateral breasts. No complaints of pain or tenderness on exam. Patient escorted to mammography for a screening mammogram.        Pelvic/Bimanual No Pap smear completed today since last Pap smear was 10/14/2013. Pap smear not indicated per [facility] guidelines.

## 2013-11-22 ENCOUNTER — Other Ambulatory Visit: Payer: Self-pay

## 2013-11-22 ENCOUNTER — Ambulatory Visit: Payer: Self-pay

## 2013-11-29 ENCOUNTER — Ambulatory Visit (HOSPITAL_BASED_OUTPATIENT_CLINIC_OR_DEPARTMENT_OTHER): Payer: Self-pay

## 2013-11-29 ENCOUNTER — Other Ambulatory Visit: Payer: Self-pay

## 2013-11-29 VITALS — BP 174/120 | HR 82 | Temp 98.4°F | Resp 16 | Ht 66.5 in | Wt 179.3 lb

## 2013-11-29 DIAGNOSIS — Z0000 Encounter for general adult medical examination without abnormal findings: Secondary | ICD-10-CM

## 2013-11-29 LAB — LIPID PANEL
Cholesterol: 212 mg/dL — ABNORMAL HIGH (ref 0–200)
HDL: 34 mg/dL — ABNORMAL LOW (ref >39)
LDL CALC: 117 mg/dL — AB (ref 0–99)
Total CHOL/HDL Ratio: 6.2 Ratio
Triglycerides: 303 mg/dL — ABNORMAL HIGH (ref <150)
VLDL: 61 mg/dL — AB (ref 0–40)

## 2013-11-29 LAB — HEMOGLOBIN A1C
Hgb A1c MFr Bld: 5.9 % — ABNORMAL HIGH (ref <5.7)
Mean Plasma Glucose: 123 mg/dL — ABNORMAL HIGH (ref <117)

## 2013-11-29 LAB — GLUCOSE (CC13): GLUCOSE: 97 mg/dL (ref 70–140)

## 2013-11-29 NOTE — Patient Instructions (Signed)
Was told that needed to go to the emergency room due to her high blood pressure. Per patient refuses to go. Stated that had something at home she could take.Discussed health assessment with patient. Informed patient that she would need to be followed up for blood pressure. She will be called with results of lab work and we will then discussed any further follow up the patient needs. Patient will implement behavior modifications to help lower BP. Patient verbalized understanding.

## 2013-11-29 NOTE — Progress Notes (Signed)
[facility] and is currently a [facility] patient effective 11/19/2013 .  Clinical Measurements: Patient is 5 ft. 6.5 inches, weight 179.3 lbs, waist circumference 38 inches, and hip circumference 40 inches.   Medical History: Patient has a history of high cholesterol and doesn't take any medications for it. She can not afford to see her old doctor. Patient does have a history of hypertension and does not have any medication at the present time for the hypertension. Per patient has no history of diabetes or takes any medications for diabetes. Per patient no diagnosed history of coronary heart disease, heart attack, heart failure, stroke/TIA, vascular disease or congenital heart defects.   Blood Pressure, Self-measurement: [facility]. Patient does not have PCP.  Nutrition Assessment: Patient stated does not eat fruit. Patient states she loves broccoli and other vegetables and eats at least 2 cups daily. Patient states does not eat 3 or more ounces of whole grains daily. Patient doesn't eat two or more servings of fish weekly. Patient does state she drinks more than 36 ounces or 450 calories of beverages with added sugars weekly. Patient stated she does watch her salt intake not adding salt to foods due to her history of hypertension.   Physical Activity Assessment: Patient stated she has just started walking and gets around 180 minutes of moderate exercise weekly.Patient stated she does not do any vigorous physical activity on a regular basis. Patient stated that use to be very active playing softball. States now her back hurts and three her shoulder out.   Smoking Status: Patient is a a former smoker. Per patient a lot of friends and her mother smoke, so she is in smoked filled areas for 6-8 hours a day.   Quality of Life Assessment: In assessing patient's Physical quality of life she stated that out of the  past 30 days that she has felt her health is not good all of them. Patient also stated that in the past 30 days that her mental health is not good including stress, depression and problems with emotions. Patient did become tearful when asked this question .Per patient she does suffer from depression and had been on Zoloft in the past. Patient did state that out of the past 30 days that around all the days she felt her physical or mental health kept her from doing her usual activities including self-care, work or recreation.   Plan: Lab work will be done today including a lipid panel, BMET and Hgb A1C. [facility]. Her appointment is 12/09/13 at 3:30 pm. Follow behavior modifications to decrease blood pressure. Will call patient for follow up health coaching and lab results. [facility].

## 2013-12-02 ENCOUNTER — Telehealth: Payer: Self-pay

## 2013-12-02 NOTE — Telephone Encounter (Signed)
Informed of lab results. Told patient that had a doctors appointment on March 30 th at 10:30 am and made a Health Coaching appointment for April 2nd.Discussed previous medications that had been on and to take bottles to clinic when she goes.

## 2013-12-09 ENCOUNTER — Ambulatory Visit: Payer: Self-pay | Attending: Internal Medicine | Admitting: Internal Medicine

## 2013-12-09 ENCOUNTER — Encounter: Payer: Self-pay | Admitting: Internal Medicine

## 2013-12-09 VITALS — BP 167/103 | HR 84 | Temp 97.9°F | Resp 18 | Ht 66.0 in | Wt 172.0 lb

## 2013-12-09 DIAGNOSIS — E785 Hyperlipidemia, unspecified: Secondary | ICD-10-CM | POA: Insufficient documentation

## 2013-12-09 DIAGNOSIS — I10 Essential (primary) hypertension: Secondary | ICD-10-CM

## 2013-12-09 DIAGNOSIS — M549 Dorsalgia, unspecified: Secondary | ICD-10-CM | POA: Insufficient documentation

## 2013-12-09 DIAGNOSIS — Z006 Encounter for examination for normal comparison and control in clinical research program: Secondary | ICD-10-CM | POA: Insufficient documentation

## 2013-12-09 LAB — CBC WITH DIFFERENTIAL/PLATELET
BASOS PCT: 0 % (ref 0–1)
Basophils Absolute: 0 10*3/uL (ref 0.0–0.1)
Eosinophils Absolute: 0.5 10*3/uL (ref 0.0–0.7)
Eosinophils Relative: 4 % (ref 0–5)
HCT: 40.6 % (ref 36.0–46.0)
HEMOGLOBIN: 14 g/dL (ref 12.0–15.0)
LYMPHS ABS: 2.4 10*3/uL (ref 0.7–4.0)
Lymphocytes Relative: 20 % (ref 12–46)
MCH: 30.7 pg (ref 26.0–34.0)
MCHC: 34.5 g/dL (ref 30.0–36.0)
MCV: 89 fL (ref 78.0–100.0)
MONOS PCT: 5 % (ref 3–12)
Monocytes Absolute: 0.6 10*3/uL (ref 0.1–1.0)
NEUTROS ABS: 8.4 10*3/uL — AB (ref 1.7–7.7)
NEUTROS PCT: 71 % (ref 43–77)
PLATELETS: 295 10*3/uL (ref 150–400)
RBC: 4.56 MIL/uL (ref 3.87–5.11)
RDW: 13.3 % (ref 11.5–15.5)
WBC: 11.8 10*3/uL — ABNORMAL HIGH (ref 4.0–10.5)

## 2013-12-09 LAB — COMPLETE METABOLIC PANEL WITH GFR
ALBUMIN: 4 g/dL (ref 3.5–5.2)
ALK PHOS: 80 U/L (ref 39–117)
ALT: 15 U/L (ref 0–35)
AST: 13 U/L (ref 0–37)
BUN: 11 mg/dL (ref 6–23)
CO2: 25 mEq/L (ref 19–32)
Calcium: 9.2 mg/dL (ref 8.4–10.5)
Chloride: 101 mEq/L (ref 96–112)
Creat: 0.7 mg/dL (ref 0.50–1.10)
GFR, Est African American: >89 mL/min
GLUCOSE: 96 mg/dL (ref 70–99)
POTASSIUM: 4.3 meq/L (ref 3.5–5.3)
SODIUM: 134 meq/L — AB (ref 135–145)
TOTAL PROTEIN: 6.5 g/dL (ref 6.0–8.3)
Total Bilirubin: 0.4 mg/dL (ref 0.2–1.2)

## 2013-12-09 LAB — TSH: TSH: 1.468 u[IU]/mL (ref 0.350–4.500)

## 2013-12-09 LAB — URIC ACID: Uric Acid, Serum: 4.4 mg/dL (ref 2.4–7.0)

## 2013-12-09 MED ORDER — LEVETIRACETAM 500 MG PO TABS: ORAL_TABLET | ORAL | Status: DC

## 2013-12-09 MED ORDER — CYCLOBENZAPRINE HCL 10 MG PO TABS: 10.0000 mg | ORAL_TABLET | Freq: Every day | ORAL | Status: DC

## 2013-12-09 MED ORDER — CLONIDINE HCL 0.1 MG PO TABS
0.1000 mg | ORAL_TABLET | Freq: Once | ORAL | Status: AC
Start: 2013-12-09 — End: 2013-12-09
  Administered 2013-12-09: 0.1 mg via ORAL

## 2013-12-09 MED ORDER — SIMVASTATIN 40 MG PO TABS: 40.0000 mg | ORAL_TABLET | Freq: Every day | ORAL | Status: DC

## 2013-12-09 MED ORDER — LISINOPRIL-HYDROCHLOROTHIAZIDE 20-25 MG PO TABS: 1.0000 | ORAL_TABLET | Freq: Every day | ORAL | Status: DC

## 2013-12-09 MED ORDER — ACETAMINOPHEN-CODEINE #2 300-15 MG PO TABS: 1.0000 | ORAL_TABLET | ORAL | Status: DC | PRN

## 2013-12-09 MED ORDER — PREDNISONE 20 MG PO TABS: 40.0000 mg | ORAL_TABLET | Freq: Every day | ORAL | Status: DC

## 2013-12-09 MED ORDER — OMEPRAZOLE 40 MG PO CPDR: 40.0000 mg | DELAYED_RELEASE_CAPSULE | Freq: Every day | ORAL | Status: DC

## 2013-12-09 NOTE — Progress Notes (Signed)
Pt here to establish care for long history of HTN, cholesterol Admits to not taking bp meds x 4 yrs since husband died BP-167/103 [id number] Denies headaches,n,v [facility] Taking Keppra 3 times/day Last BP med Diovan

## 2013-12-09 NOTE — Progress Notes (Signed)
Patient ID: [name], female   DOB: 12/06/1965, 48 y.o.   MRN: [id number]   CC:  HPI:  48 year old female with a history of seizure disorder, hypertension, also presents with acute back pain for the last 3 weeks. Her last seizure was May 8. The patient is followed by Dr. Vickey Ortega and Kelly FeltsJanece Moore, NP. She typically describes it as being tonic-clonic seizures. Last seen by neurology on 11/14. Currently compliant with Keppra next  She also has a history of hypertension and was previously on Diovan/HCTZ Blood pressure elevated today in the 160s. Patient received 1 dose of 0.1 mg of clonidine  Patient had a Pap smear done 10/14/13, that showed LSIL.[facility] for a colposcopy to follow-up for abnormal Pap smear. Appointment scheduled for Thursday, December 26, 2013 at 1315. Per patient has no history of abnormal Pap smears prior to the most recent Pap smear. Patient has a history of a hysterectomy due to endometriosis.  Mammogram was negative  Describes acute or chronic low back pain aggravated by twisting and turning motion for the last 3 weeks. Started when the patient was trying to get out of her bathtub and turned the wrong way. Exacerbated by the fact that she tried to pick up her nephew. The patient has been taking ibuprofen 800 mg 3 times a day. Pain radiating down the right SI joint into the right leg. No previous history of back pain  Social history Nonsmoker, admits to drinking, does not comment on how the times a week despite me asking her several times Husband died last year and the patient has been trying to cope up with his death      Allergies  Allergen Reactions  . Contrast Media [Iodinated Diagnostic Agents] Anaphylaxis    Contrast Dye   . Codeine   . Iohexol      Desc: UNABLE TO BREATH   . Other Other (See Comments)    Cashews and pistachios; reaction unknown   Past Medical History  Diagnosis Date  . Seizures   .  Asthma   . Hypertension   . Hyperlipemia    Current Outpatient Prescriptions on File Prior to Visit  Medication Sig Dispense Refill  . Fish Oil-Cholecalciferol (FISH OIL + D3) 1000-1000 MG-UNIT CAPS Take by mouth.      Marland Kitchen. albuterol (PROVENTIL HFA) 108 (90 BASE) MCG/ACT inhaler Inhale 2 puffs into the lungs every 4 (four) hours as needed for wheezing or shortness of breath.      . calcium carbonate (OS-CAL) 1250 MG chewable tablet Chew 1 tablet by mouth daily.      . Multiple Vitamin (MULTIVITAMIN) tablet Take 1 tablet by mouth daily.       No current facility-administered medications on file prior to visit.   Family History  Problem Relation Age of Onset  . Breast cancer Mother   . Hypertension Mother   . Heart failure Father    History   Social History  . Marital Status: Widowed    Spouse Name: N/A    Number of Children: 2  . Years of Education: 12   Occupational History  . Not on file.   Social History Main Topics  . Smoking status: Former Games developermoker  . Smokeless tobacco: Not on file  . Alcohol Use: Yes     Comment: beer  . Drug Use: No  . Sexual Activity: Yes    Birth Control/ Protection: Surgical   Other Topics Concern  . Not  on file   Social History Narrative   Patient is widowed.   Patient has 2 children.   Patient is currently unemployed.   Patient has a high school education.   Patient is right-handed.   Patient drinks 2 glass of caffeine daily ( soda and tea).    Review of Systems  Constitutional: Negative for fever, chills, diaphoresis, activity change, appetite change and fatigue.  HENT: Negative for ear pain, nosebleeds, congestion, facial swelling, rhinorrhea, neck pain, neck stiffness and ear discharge.   Eyes: Negative for pain, discharge, redness, itching and visual disturbance.  Respiratory: Negative for cough, choking, chest tightness, shortness of breath, wheezing and stridor.   Cardiovascular: Negative for chest pain, palpitations and leg  swelling.  Gastrointestinal: Negative for abdominal distention.  Genitourinary: Negative for dysuria, urgency, frequency, hematuria, flank pain, decreased urine volume, difficulty urinating and dyspareunia.  Musculoskeletal: As in history of present illness.  Neurological: Negative for dizziness, tremors, seizures, syncope, facial asymmetry, speech difficulty, weakness, light-headedness, numbness and headaches.  Hematological: Negative for adenopathy. Does not bruise/bleed easily.  Psychiatric/Behavioral: Negative for hallucinations, behavioral problems, confusion, dysphoric mood, decreased concentration and agitation.    Objective:   Filed Vitals:   12/09/13 1553  BP: 167/103  Pulse: 84  Temp: 97.9 F (36.6 C)  Resp: 18    Physical Exam  Constitutional: Appears well-developed and well-nourished. No distress.  HENT: Normocephalic. External right and left ear normal. Oropharynx is clear and moist.  Eyes: Conjunctivae and EOM are normal. PERRLA, no scleral icterus.  Neck: Normal ROM. Neck supple. No JVD. No tracheal deviation. No thyromegaly.  CVS: RRR, S1/S2 +, no murmurs, no gallops, no carotid bruit.  Pulmonary: Effort and breath sounds normal, no stridor, rhonchi, wheezes, rales.  Abdominal: Soft. BS +,  no distension, tenderness, rebound or guarding.  Musculoskeletal: Normal range of motion. No edema and no tenderness.  Lymphadenopathy: No lymphadenopathy noted, cervical, inguinal. Neuro: Alert. Normal reflexes, muscle tone coordination. No cranial nerve deficit. Skin: Skin is warm and dry. No rash noted. Not diaphoretic. No erythema. No pallor.  Psychiatric: Normal mood and affect. Behavior, judgment, thought content normal.   Lab Results  Component Value Date   WBC 21.0* 08/09/2010   HGB 11.1* 08/09/2010   HCT 34.7* 08/09/2010   MCV 89.4 08/09/2010   PLT 317 08/09/2010   Lab Results  Component Value Date   CREATININE 0.50 10/20/2011   BUN 5* 10/20/2011   NA 135 10/20/2011    K 3.9 10/20/2011   CL 101 10/20/2011   CO2 22 10/20/2011    Lab Results  Component Value Date   HGBA1C 5.9* 11/29/2013   Lipid Panel     Component Value Date/Time   CHOL 212* 11/29/2013 1456   TRIG 303* 11/29/2013 1456   HDL 34* 11/29/2013 1456   CHOLHDL 6.2 11/29/2013 1456   VLDL 61* 11/29/2013 1456   LDLCALC 117* 11/29/2013 1456       Assessment and plan:   Patient Active Problem List   Diagnosis Date Noted  . 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 Generalized convulsive epilepsy without mention of intractable epilepsy 07/24/2013   Back pain Lumbar radiculopathy? Given pain is only 3 weeks in duration we'll try to treat with medications Start the patient on Flexeril at bedtime, Tylenol with codeine and prednisone If pain persists beyond 6 weeks we'll obtain imaging studies   Hypertension Start the patient on lisinopril/HCTZ  Dyslipidemia Triglycerides elevated, refill simvastatin 40 mg  Establish care Patient does  have a followup colposcopy for abnormal Pap smear Mammogram negative  Patient to followup in 2 weeks for a blood pressure check       The patient was given clear instructions to go to ER or return to medical center if symptoms don't improve, worsen or new problems develop. The patient verbalized understanding. The patient was told to call to get any lab results if not heard anything in the next week.

## 2013-12-10 LAB — VITAMIN D 25 HYDROXY (VIT D DEFICIENCY, FRACTURES): Vit D, 25-Hydroxy: 30 ng/mL (ref 30–89)

## 2013-12-11 ENCOUNTER — Telehealth: Payer: Self-pay | Admitting: Emergency Medicine

## 2013-12-11 NOTE — Telephone Encounter (Signed)
Message copied by Darlis LoanSMITH, Mycala Warshawsky D on Wed Dec 11, 2013  5:48 PM ------      Message from: Susie CassetteABROL MD, Germain OsgoodNAYANA      Created: Wed Dec 11, 2013 11:13 AM       Notify patient that the labs are normal ------

## 2013-12-11 NOTE — Telephone Encounter (Signed)
Pt given lab results 

## 2013-12-12 ENCOUNTER — Ambulatory Visit (HOSPITAL_BASED_OUTPATIENT_CLINIC_OR_DEPARTMENT_OTHER): Payer: Self-pay

## 2013-12-12 VITALS — BP 118/92

## 2013-12-12 DIAGNOSIS — Z789 Other specified health status: Secondary | ICD-10-CM

## 2013-12-12 NOTE — Progress Notes (Signed)
Patient returns today for Health Coaching regarding Hypertension, Activity, Cholesterol, and Nutrition for her borderline AIC.  HYPERTENSION: [facility] patient [facility] Per patient they gave Lisinopril/HCTZ 20/25 mg every day her a BP. Rechecked BP at this time was 118/92. Discussed hypertension: cause, effects, treatment, healthy weight, eating right and medication. Went over and gave handout concerning ten ways to decrease salt in your life.. Patient informed about side effects of HCTZ.Marland Kitchen.   NUTRITION/CHOLESTEROL: Discussed Counting Carbohydrates.  Patient received handouts on: What's on your Plate, Choose My Plate, Make better food choices, Counting Carbohydrates and Cholesterol.  ACTIVITY: Discussed back problems. Patient received handout on Lower back stretches and exercises for muscle soreness and lower back spasm. Informed her to get it cleared with Physician before doing them. Informed about how exercise could help lower glucose, blood pressure, and cholesterol. Gave pamphlet on Exercise and Physical Activity.    MISCELLANEOUS: Discussed need to get orange card for further treatment and how to obtain one.  PLAN: Review handouts and follow information at home. Will call next week to follow up on how patient is doing.Will do further health coaching as needed and follow glucose.

## 2013-12-12 NOTE — Patient Instructions (Signed)
Discussed lab results. Patient will increase fiber in diet, Will decrease sodium in diet and increase Potassium. Increase activity slowly under doctor's supervision. Patient voiced understanding.

## 2013-12-26 ENCOUNTER — Encounter: Payer: Self-pay | Admitting: Nurse Practitioner

## 2013-12-26 ENCOUNTER — Other Ambulatory Visit (HOSPITAL_COMMUNITY)
Admission: RE | Admit: 2013-12-26 | Discharge: 2013-12-26 | Disposition: A | Discharge status: Home / Self Care | Payer: Self-pay | Source: Ambulatory Visit | Attending: Nurse Practitioner | Admitting: Nurse Practitioner

## 2013-12-26 ENCOUNTER — Ambulatory Visit (INDEPENDENT_AMBULATORY_CARE_PROVIDER_SITE_OTHER): Payer: Self-pay | Admitting: Nurse Practitioner

## 2013-12-26 VITALS — BP 137/89 | HR 82 | Temp 97.7°F | Ht 66.0 in | Wt 179.0 lb

## 2013-12-26 DIAGNOSIS — N893 Dysplasia of vagina, unspecified: Secondary | ICD-10-CM | POA: Insufficient documentation

## 2013-12-26 DIAGNOSIS — R87622 Low grade squamous intraepithelial lesion on cytologic smear of vagina (LGSIL): Secondary | ICD-10-CM

## 2013-12-26 LAB — POCT PREGNANCY, URINE: PREG TEST UR: NEGATIVE

## 2013-12-26 NOTE — Addendum Note (Signed)
Addended by: Candelaria StagersHAIZLIP, Jhonnie Aliano E on: 12/26/2013 02:30 PM   Modules accepted: Orders

## 2013-12-26 NOTE — Progress Notes (Signed)
History:  [name] is a 48 y.o. [id number] who [facility]. She was seen at a pap screening though she has had a hysterectomy for endometriosis. Her vaginal walls were tested and results came back from pathology as LSIL. She has never had an abnormal pap. She is not sexually active as her husband passed away 1 year ago. She is unsure about her menopause status but she knows she is having more night sweats. She is undecided about HRT.  The following portions of the patient's history were reviewed and updated as appropriate: allergies, current medications, past family history, past medical history, past social history, past surgical history and problem list.  Review of Systems:  Pertinent items are noted in HPI.  Objective:  Physical Exam There were no vitals taken for this visit. GENERAL: Well-developed, well-nourished female in no acute distress.  HEENT: Normocephalic, atraumatic.  PELVIC: Normal external female genitalia. Vagina is pink and rugated.  Colpo preformed EXTREMITIES: No cyanosis, clubbing, or edema, 2+ distal pulses.      GYNECOLOGY CLINIC COLPOSCOPY PROCEDURE NOTE  48 y.o. [id number] here for colposcopy for low-grade squamous intraepithelial neoplasia (LGSIL - encompassing HPV,mild dysplasia,CIN I) pap smear on Feb 2015.  Discussed role for HPV in cervical dysplasia, need for surveillance.   Patient given informed consent, signed copy in the chart, time out was performed.  Placed in lithotomy position. Cervix viewed with speculum and colposcope after application of acetic acid.   Colposcopy adequate? No cervix Biopsy obtained from left vaginal X1 only All specimens were labelled and sent to pathology.  Patient was given post procedure instructions.  Will follow up pathology and manage accordingly.  Routine preventative health maintenance measures emphasized.    Alyda Megna, NP Faculty Practice, [facility]    Labs and Imaging No results found.  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

## 2013-12-26 NOTE — Patient Instructions (Signed)
Colposcopy  Colposcopy is a procedure to examine your cervix and vagina, or the area around the outside of your vagina, for abnormalities or signs of disease. The procedure is done using a lighted microscope called a colposcope. Tissue samples may be collected during the colposcopy if your health care provider finds any unusual cells. A colposcopy may be done if a woman has:   An abnormal Pap test. A Pap test is a medical test done to evaluate cells that are on the surface of the cervix.   A Pap test result that is suggestive of human papillomavirus (HPV). This virus can cause genital warts and is linked to the development of cervical cancer.   A sore on her cervix and the results of a Pap test were normal.   Genital warts on the cervix or in or around the outside of the vagina.   A mother who took the drug diethylstilbestrol (DES) while pregnant.   Painful intercourse.   Vaginal bleeding, especially after sexual intercourse.  LET YOUR HEALTH CARE PROVIDER KNOW ABOUT:   Any allergies you have.   All medicines you are taking, including vitamins, herbs, eye drops, creams, and over-the-counter medicines.   Previous problems you or members of your family have had with the use of anesthetics.   Any blood disorders you have.   Previous surgeries you have had.   Medical conditions you have.  RISKS AND COMPLICATIONS  Generally, a colposcopy is a safe procedure. However, as with any procedure, complications can occur. Possible complications include:   Bleeding.   Infection.   Missed lesions.  BEFORE THE PROCEDURE    Tell your health care provider if you have your menstrual period. A colposcopy typically is not done during menstruation.   For 24 hours before the colposcopy, do not:   Douche.   Use tampons.   Use medicines, creams, or suppositories in the vagina.   Have sexual intercourse.  PROCEDURE   During the procedure, you will be lying on your back with your feet in foot rests (stirrups). A warm  metal or plastic instrument (speculum) will be placed in your vagina to keep it open and to allow the health care provider to see the cervix. The colposcope will be placed outside the vagina. It will be used to magnify and examine the cervix, vagina, and the area around the outside of the vagina. A small amount of liquid solution will be placed on the area that is to be viewed. This solution will make it easier to see the abnormal cells. Your health care provider will use tools to suck out mucus and cells from the canal of the cervix. Then he or she will record the location of the abnormal areas.  If a biopsy is done during the procedure, a medicine will usually be given to numb the area (local anesthetic). You may feel mild pain or cramping while the biopsy is done. After the procedure, tissue samples collected during the biopsy will be sent to a lab for analysis.  AFTER THE PROCEDURE   You will be given instructions on when to follow up with your health care provider for your test results. It is important to keep your appointment.  Document Released: 11/19/2002 Document Revised: 05/01/2013 Document Reviewed: 03/28/2013  [employer] Patient Information 2014 [employer].

## 2014-01-01 ENCOUNTER — Encounter: Payer: Self-pay | Admitting: *Deleted

## 2014-01-06 ENCOUNTER — Telehealth: Payer: Self-pay

## 2014-01-06 NOTE — Telephone Encounter (Signed)
Message copied by Louanna RawAMPBELL, Jennifermarie Franzen M on Mon Jan 06, 2014  1:04 PM ------      Message from: BAREFOOT, [place] M      Created: Tue Dec 31, 2013  4:33 PM       Ladona Ridgelaylor, I am consulting with Dr Shawnie PonsPratt and will let you know...she thinks you just repap q 6 months and will let us know if different from that, thanks, Bonita QuinLinda      ----- Message -----         From: Louanna Rawaylor M Anisa Leanos, RN         Sent: 12/31/2013   2:01 PM           To: Delbert PhenixLinda M Barefoot, NP            Geannie RisenHi Linda,             This patient's colpo results came back LSIL. How would you like to proceed? Please send message to clinical pool to let us know when to inform pt and what, if anything, needs to be done/scheduled.             Thanks so much,       Ladona Ridgelaylor, Charity fundraiserN        ------

## 2014-01-06 NOTE — Telephone Encounter (Signed)
Pt returned call and I informed her that the provider would like for her to come in at 6 months for a repeat pap smear.  Pt stated understanding.

## 2014-01-06 NOTE — Telephone Encounter (Signed)
Called pt and left message to return call to the clinics.  

## 2014-01-06 NOTE — Telephone Encounter (Signed)
Confirmed with Jannifer RodneyLinda Barefoot that pt. Is to have repeat pap in 6 months. Pt. Needs to be informed.

## 2014-02-25 ENCOUNTER — Telehealth: Payer: Self-pay

## 2014-02-25 NOTE — Telephone Encounter (Signed)
Called to follow up on blood pressure, nutrition and physical activity. Left message.

## 2014-03-11 ENCOUNTER — Ambulatory Visit: Payer: Self-pay | Admitting: Internal Medicine

## 2014-03-18 ENCOUNTER — Telehealth: Payer: Self-pay

## 2014-03-18 NOTE — Telephone Encounter (Signed)
This is the third attempt to reach [name] to follow up on [facility]. She has not returned call from previous message left and now says that voice mail has not been set up.  Plan: Withdraw patient from [employer] for [facility].

## 2014-06-17 ENCOUNTER — Telehealth: Payer: Self-pay | Admitting: *Deleted

## 2014-06-17 NOTE — Telephone Encounter (Signed)
Called patient and left voice message to call back and r/s appointment time on 07/25/14 with either NP MM or NP LL. NP CM ADMIN TIME

## 2014-06-25 ENCOUNTER — Encounter: Payer: Self-pay | Admitting: *Deleted

## 2014-07-14 ENCOUNTER — Encounter: Payer: Self-pay | Admitting: Nurse Practitioner

## 2014-07-25 ENCOUNTER — Encounter: Payer: Self-pay | Admitting: Nurse Practitioner

## 2014-07-25 ENCOUNTER — Ambulatory Visit (INDEPENDENT_AMBULATORY_CARE_PROVIDER_SITE_OTHER): Payer: Self-pay | Admitting: Nurse Practitioner

## 2014-07-25 VITALS — BP 143/104 | HR 80 | Temp 98.4°F | Ht 66.5 in | Wt 176.0 lb

## 2014-07-25 DIAGNOSIS — G40309 Generalized idiopathic epilepsy and epileptic syndromes, not intractable, without status epilepticus: Secondary | ICD-10-CM

## 2014-07-25 MED ORDER — LEVETIRACETAM 500 MG PO TABS: ORAL_TABLET | ORAL | Status: DC

## 2014-07-25 NOTE — Progress Notes (Signed)
I agree with the assessment and plan as directed by NP .The patient is known to me .   Rome Echavarria, MD  

## 2014-07-25 NOTE — Patient Instructions (Addendum)
PLAN: Continue  Keppra 1500 mg daily - 1 500 mg ablet in the morning and 2 500 mg tablets at night. Will renew for one year. Followup yearly and when necessary.   Some other known risk factors include having a seizure in the first month of life, being born with brain abnormalities, having a developmental disability, and a family history of seizures.  More than 1 in every 2 people who develop epilepsy don't have any risk factors. Risk factors for developing epilepsy are not the same thing as a seizure triggers. Seizure triggers are things that make you more likely to have a seizure if you already have epilepsy. Knowing your triggers can help you get better control of seizures.   A risk factor is something that makes a person more likely to develop a certain disease or condition. Common risk factors for seizures and epilepsy are listed below. Some of these things can cause scarring of the brain or can lead to areas of the brain not developing fully or working right. Sometimes, a person may have one these risk factors, but they are not the cause of the seizures. In someone else,  one or more risk factor may lead to a person developing seizures and epilepsy.  These are some common risk factors for epilepsy:  Problems or injuries in your brain:  Abnormal blood vessels in the brain  Serious brain injury  Lack of oxygen to the brain  Bleeding into the brain  Infection in the brain, like meningitis, encephalitis,  Brain tumor  Inflammation of the brain  Risk factors in your medical history:  Being born with abnormal areas in the brain  Being born small for your age  Having seizures in the first month of life  Having seizures within days after a head injury (called "early posttraumatic seizures")  Fever-related (febrile) seizures that last longer than usual  Long episodes of seizures or repeated seizures (called "status epilepticus")  Family history of epilepsy or fever-related  seizures  Using illegal drugs, like cocaine  Other health conditions that make epilepsy more likely:  Autism spectrum disorders  Cerebral palsy  Intellectual and developmental disabilities  Stroke that is caused by blocked arteries (called an "ischemic stroke")  Alzheimer's disease (late in the illness)  Having a mild head injury, like a concussion with just a very brief loss of consciousness, won't cause epilepsy. But we don't know yet if repeated mild head injuries could cause epilepsy.  What if I don't have any of the risk factors? If that's the case, you're not alone! Even though the disorders and injuries listed on this page can explain many cases of epilepsy, more than 1 out of every 2 people with epilepsy don't have any of these risk factors. Often we just don't know how or why epilepsy starts.
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
## 2014-11-25 ENCOUNTER — Telehealth (HOSPITAL_COMMUNITY): Payer: Self-pay | Admitting: *Deleted

## 2014-11-25 NOTE — Telephone Encounter (Signed)
Attempted to call patient to schedule follow-up appointment in [facility] for CBE, Mammogram, and Pap smear. No one answered the phone. Left voicemail for patient to call me back.

## 2014-11-25 NOTE — Telephone Encounter (Signed)
Patient returned my phone call and left voicemail. Called patient back and scheduled her appointment for [facility] clinic 12/11/2014 at 1000. Patient verbalized understanding.

## 2014-12-01 ENCOUNTER — Other Ambulatory Visit (HOSPITAL_COMMUNITY): Payer: Self-pay | Admitting: *Deleted

## 2014-12-01 DIAGNOSIS — N644 Mastodynia: Secondary | ICD-10-CM

## 2014-12-11 ENCOUNTER — Ambulatory Visit
Admission: RE | Admit: 2014-12-11 | Discharge: 2014-12-11 | Disposition: A | Discharge status: Home / Self Care | Payer: No Typology Code available for payment source | Source: Ambulatory Visit | Attending: Obstetrics and Gynecology | Admitting: Obstetrics and Gynecology

## 2014-12-11 ENCOUNTER — Other Ambulatory Visit (HOSPITAL_COMMUNITY): Payer: Self-pay | Admitting: *Deleted

## 2014-12-11 ENCOUNTER — Ambulatory Visit (HOSPITAL_COMMUNITY)
Admission: RE | Admit: 2014-12-11 | Discharge: 2014-12-11 | Disposition: A | Discharge status: Home / Self Care | Payer: Self-pay | Source: Ambulatory Visit | Attending: Obstetrics and Gynecology | Admitting: Obstetrics and Gynecology

## 2014-12-11 ENCOUNTER — Encounter (HOSPITAL_COMMUNITY): Payer: Self-pay

## 2014-12-11 VITALS — BP 152/100 | Temp 97.8°F | Ht 66.5 in | Wt 184.4 lb

## 2014-12-11 DIAGNOSIS — B379 Candidiasis, unspecified: Secondary | ICD-10-CM

## 2014-12-11 DIAGNOSIS — N644 Mastodynia: Secondary | ICD-10-CM

## 2014-12-11 DIAGNOSIS — Z01419 Encounter for gynecological examination (general) (routine) without abnormal findings: Secondary | ICD-10-CM

## 2014-12-11 DIAGNOSIS — N898 Other specified noninflammatory disorders of vagina: Secondary | ICD-10-CM

## 2014-12-11 NOTE — Progress Notes (Signed)
Complaints of bilateral breast pain that comes and goes. Patient rated pain at a 6 out of 10. Patient states she has noticed some clear left breast discharge when expresses. Patient stated that she had a left breast lump but it went away.  Pap Smear:  Pap smear completed today. Patients last Pap smear was 10/14/2013 at the free cervical cancer [facility] that was LSIL, VAIN-1/HPV. 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 Follow-up will be based on the results to today's Pap smear. Last Pap smear result is scanned into EPIC under media.  Physical exam: Breasts Breasts symmetrical. No skin abnormalities bilateral breasts. No nipple retraction bilateral breasts. No nipple discharge bilateral breasts. Unable to express nipple discharge from the left breast on exam. No lymphadenopathy. No lumps palpated bilateral breasts. Complaints of left upper breast tenderness on exam. [facility]. Appointment scheduled for Thursday, December 11, 2014 at 1120.        Pelvic/Bimanual   Ext Genitalia No lesions, no swelling and no discharge observed on external genitalia.         Vagina Vagina pink and normal texture. No lesions and thick white cottage cheese appearing discharge observed in vagina. Wet prep completed.          Cervix Cervix is absent due to history of hysterectomy for benign reasons.    Uterus Uterus is absent due to history of hysterectomy for benign reasons.    Adnexae Bilateral ovaries present and palpable. No tenderness on palpation.          Rectovaginal No rectal exam completed today since patient had no rectal complaints. No skin abnormalities observed on exam.

## 2014-12-11 NOTE — Patient Instructions (Signed)
Education materials on self-breast awareness given. Explained to [name] that today's Pap smear result will determine when next Pap smear is due and/or what follow-up is needed. [facility]. Appointment scheduled for Thursday, December 11, 2014 at 1120. Patient aware of appointment and will be there. Let patient know will follow up with her within the next couple weeks with results of Pap smear and wet prep. [name] verbalized understanding.  Tye Vigo, Kathaleen Maserhristine Poll, RN 11:32 AM

## 2014-12-11 NOTE — Addendum Note (Signed)
Encounter addended by: Lynnell DikeSabrina H Holland, LPN on: 5/62/13083/31/2016 12:39 PM<BR>     Documentation filed: Visit Diagnoses, Dx Association, Orders

## 2014-12-15 LAB — CYTOLOGY - PAP

## 2014-12-29 ENCOUNTER — Other Ambulatory Visit (HOSPITAL_COMMUNITY): Payer: Self-pay | Admitting: *Deleted

## 2014-12-29 DIAGNOSIS — B9689 Other specified bacterial agents as the cause of diseases classified elsewhere: Secondary | ICD-10-CM

## 2014-12-29 DIAGNOSIS — N760 Acute vaginitis: Secondary | ICD-10-CM

## 2014-12-29 MED ORDER — METRONIDAZOLE 500 MG PO TABS: 500.0000 mg | ORAL_TABLET | Freq: Two times a day (BID) | ORAL | Status: DC

## 2014-12-29 NOTE — Progress Notes (Signed)
Telephoned patient at home # and left message to [facility]. Medication for bacterial vaginosis sent to pharmacy.

## 2015-01-14 ENCOUNTER — Telehealth (HOSPITAL_COMMUNITY): Payer: Self-pay | Admitting: *Deleted

## 2015-01-14 NOTE — Telephone Encounter (Signed)
Telephoned patient at home # and left message to return call to [facility]. Patient's prescription for bacterial vaginosis has been sent to pharmacy. Patient is unaware.

## 2015-02-03 ENCOUNTER — Other Ambulatory Visit (HOSPITAL_COMMUNITY): Payer: Self-pay | Admitting: *Deleted

## 2015-02-03 ENCOUNTER — Telehealth (HOSPITAL_COMMUNITY): Payer: Self-pay | Admitting: *Deleted

## 2015-02-03 DIAGNOSIS — B9689 Other specified bacterial agents as the cause of diseases classified elsewhere: Secondary | ICD-10-CM

## 2015-02-03 DIAGNOSIS — N760 Acute vaginitis: Secondary | ICD-10-CM

## 2015-02-03 MED ORDER — METRONIDAZOLE 500 MG PO TABS: 500.0000 mg | ORAL_TABLET | Freq: Two times a day (BID) | ORAL | Status: DC

## 2015-02-03 NOTE — Telephone Encounter (Signed)
Telephoned patient at home # and advised patient of abnormal pap smear results. 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 Patient would like to repeat pap in 6 months. If still abnormal patient will proceed with colposcopy. [name] had consulted with Dr. Jolayne Pantheronstant and patient was given option 6 month repeat pap or colposcopy. Patient had never picked up prescription for bacterial vaginosis. Recalled prescription in. Patient voiced understanding of all information. Will call patient first of September to schedule pap smear appointment.

## 2015-07-28 ENCOUNTER — Ambulatory Visit (INDEPENDENT_AMBULATORY_CARE_PROVIDER_SITE_OTHER): Payer: Self-pay | Admitting: Nurse Practitioner

## 2015-07-28 ENCOUNTER — Encounter: Payer: Self-pay | Admitting: Nurse Practitioner

## 2015-07-28 VITALS — BP 150/90 | HR 92 | Ht 67.5 in | Wt 179.8 lb

## 2015-07-28 DIAGNOSIS — G40309 Generalized idiopathic epilepsy and epileptic syndromes, not intractable, without status epilepticus: Secondary | ICD-10-CM

## 2015-07-28 MED ORDER — LEVETIRACETAM 500 MG PO TABS: ORAL_TABLET | ORAL | Status: DC

## 2015-07-28 NOTE — Progress Notes (Signed)
I agree with the assessment and plan as directed by NP .The patient is known to me .   Stephenia Vogan, MD  

## 2015-07-28 NOTE — Patient Instructions (Signed)
Continue Keppra at current dose will refill for one year. Call for any seizure activity Follow-up yearly and when necessary 

## 2015-07-28 NOTE — Progress Notes (Signed)
[facility]  PATIENT: CAYMAN BROGDEN DOB: 1966/01/11   REASON FOR VISIT: Follow-up for epilepsy HISTORY FROM: Patient    HISTORY OF PRESENT ILLNESS:Ms. Consalvo, 49 year old white female returns for followup. She has been seen in the past in this office by Heide Guile NP 07/25/2014 . History of epilepsy with last seizure occurring 2 years ago Jan 17, 2013 after missing several doses of her Keppra medication. She is currently on Keppra 500 mg a.m. and 1000 PM. She denies missing any doses of medication. She denies any side effects to the drug. She returns for reevaluation   HISTORY: This 49 year old cauc. right handed female presents with sudden onset of "seizures" fits, that started with an episode on May 12th at home, in 8 AM. She lost her husband on April 6th, and had not been able to stay in the marital home, slept at her sisters. Her husband suffered a sudden cardiac death in the home, in the presence of several family members. She came to her own house on 58 th may, in the morning to get the post from her letter box. Than she has a memory gap- She found herself on the floor, was surprised to "sleep on the floor" , nobody witnessed this in effect. She had lots of bruising, no incontinence/ no tongue bite , but shoulder pain. She went to the PCP the next day, Xray of the shoulder showed no bone injuries, she did not want to have CT head taken, because of insurance difficulties.  On the 13 th April 10 she had a second spell, this is a Thursday night, she had gone with her sister to her house, she entered the house "fell suddenly,,with herlips blue, her eyes rolling," she fell into a glass table, suff. injuries plus urine and stool incontinence, brought to [facility] after 3 minutes of generalized convusions. She was released without further work up, had severe bruising in the face and right shouler. Her sister states she believes the seizure lasted 7-8 minutes - a rather unlikely  duration.Next day she developed a fever, she is brought by private car to [facility], this time had a CT of the brain and lab work- with no abnormal results, except aspiration pneumonia.     REVIEW OF SYSTEMS: Full 14 system review of systems performed and notable only for those listed, all others are neg:  Constitutional: neg  Cardiovascular: neg Ear/Nose/Throat: neg  Skin: neg Eyes: neg Respiratory: , Cough Gastroitestinal: neg  Hematology/Lymphatic: neg  Endocrine: neg Musculoskeletal:neg Allergy/Immunology: Seasonal allergies Neurological: neg Psychiatric: Depression Sleep : neg   ALLERGIES: Allergies  Allergen Reactions  . Contrast Media [Iodinated Diagnostic Agents] Anaphylaxis    Contrast Dye   . Codeine   . Iohexol      Desc: UNABLE TO BREATH   . Other Other (See Comments)    Cashews and pistachios; reaction unknown    HOME MEDICATIONS: Outpatient Prescriptions Prior to Visit  Medication Sig Dispense Refill  . esomeprazole (NEXIUM) 20 MG packet Take 20 mg by mouth daily before breakfast.    . Fish Oil-Cholecalciferol (FISH OIL + D3) 1000-1000 MG-UNIT CAPS Take by mouth.    . levETIRAcetam (KEPPRA) 500 MG tablet One tablet in the morning and two tablets at bedtime 90 tablet 11  . Multiple Vitamin (MULTIVITAMIN) tablet Take 1 tablet by mouth daily.    Marland Kitchen acetaminophen-codeine (TYLENOL #2) 300-15 MG per tablet Take 1 tablet by mouth every 4 (four) hours as needed for moderate pain. (  Patient not taking: Reported on 07/28/2015) 45 tablet 0  . albuterol (PROVENTIL HFA) 108 (90 BASE) MCG/ACT inhaler Inhale 2 puffs into the lungs every 4 (four) hours as needed for wheezing or shortness of breath.    . lisinopril-hydrochlorothiazide (PRINZIDE,ZESTORETIC) 20-25 MG per tablet Take 1 tablet by mouth daily. (Patient not taking: Reported on 07/28/2015) 90 tablet 3  . metroNIDAZOLE (FLAGYL) 500 MG tablet Take 1 tablet (500 mg total) by mouth 2 (two) times daily. 14 tablet 0  .  metroNIDAZOLE (FLAGYL) 500 MG tablet Take 1 tablet (500 mg total) by mouth 2 (two) times daily. (Patient not taking: Reported on 07/28/2015) 14 tablet 0  . simvastatin (ZOCOR) 40 MG tablet Take 1 tablet (40 mg total) by mouth at bedtime. (Patient not taking: Reported on 07/28/2015) 90 tablet 3   No facility-administered medications prior to visit.    PAST MEDICAL HISTORY: Past Medical History  Diagnosis Date  . Seizures (HCC)   . Asthma   . Hypertension   . Hyperlipemia     PAST SURGICAL HISTORY: Past Surgical History  Procedure Laterality Date  . Abdominal hysterectomy    . Cholecystectomy      FAMILY HISTORY: Family History  Problem Relation Age of Onset  . Breast cancer Mother   . Hypertension Mother   . Heart failure Father   . Hypertension Father   . Hyperlipidemia Father     SOCIAL HISTORY: Social History   Social History  . Marital Status: Widowed    Spouse Name: N/A  . Number of Children: 2  . Years of Education: 12   Occupational History  . Not on file.   Social History Main Topics  . Smoking status: Former Games developermoker  . Smokeless tobacco: Not on file  . Alcohol Use: 1.2 oz/week    2 Cans of beer per week     Comment: beer, liquor  . Drug Use: No  . Sexual Activity: Yes    Birth Control/ Protection: Surgical   Other Topics Concern  . Not on file   Social History Narrative   Patient is widowed.   Patient has 2 children.   Patient is currently unemployed.   Patient has a high school education.   Patient is right-handed.   Patient drinks 2 glass of caffeine daily ( soda and tea).     PHYSICAL EXAM  Filed Vitals:   07/28/15 1328  BP: 166/103  Pulse: 92  Height: 5' 7.5" (1.715 m)  Weight: 179 lb 12.8 oz (81.557 kg)   Body mass index is 27.73 kg/(m^2). Generalized: Well developed, in no acute distress  Head: normocephalic and atraumatic. Oropharynx benign  Neck: Supple, no carotid bruits  Cardiac: Regular rate rhythm, no murmur   Musculoskeletal: No deformity   Neurological examination  Mentation: Alert oriented to time, place, history taking. Follows all commands speech and language fluent Cranial nerve II-XII: Fundoscopic exam not done. Pupils were equal round reactive to light extraocular movements were full, visual field were full on confrontational test. Facial sensation and strength were normal. hearing was intact to finger rubbing bilaterally. Uvula tongue midline. head turning and shoulder shrug and were normal and symmetric.Tongue protrusion into cheek strength was normal. Motor: The motor testing reveals 5 over 5 strength of all 4 extremities. Good symmetric motor tone is noted throughout.  Sensory: Sensory testing is intact to soft touch on all 4 extremities. No evidence of extinction is noted.  Coordination: Cerebellar testing reveals good finger-nose-finger and heel-to-shin bilaterally.  Gait  and station: Gait is normal. Tandem gait is normal. Romberg is negative. Reflexes: Deep tendon reflexes are symmetric and normal bilaterally.  DIAGNOSTIC DATA (LABS, IMAGING, TESTING) - ASSESSMENT AND PLAN  49 y.o. year old female  has a past medical history of Seizures (HCC); Asthma; Hypertension; and Hyperlipemia. here to follow-up for seizure disorder. Last seizure event occurred May 2014 after missing several doses of her Keppra.  Continue Keppra at current dose will refill for one year Call for any seizure activity Follow-up yearly and when necessary Nilda Riggs, [facility], [facility], APRN  [facility] [address] [place], [place] [address] [phone]

## 2015-10-04 ENCOUNTER — Telehealth: Payer: Self-pay | Admitting: Radiology

## 2015-10-04 ENCOUNTER — Ambulatory Visit (INDEPENDENT_AMBULATORY_CARE_PROVIDER_SITE_OTHER): Payer: Self-pay | Admitting: Family Medicine

## 2015-10-04 VITALS — BP 150/110 | HR 88 | Temp 98.5°F | Resp 16 | Ht 67.0 in | Wt 182.0 lb

## 2015-10-04 DIAGNOSIS — R0789 Other chest pain: Secondary | ICD-10-CM

## 2015-10-04 DIAGNOSIS — E876 Hypokalemia: Secondary | ICD-10-CM

## 2015-10-04 DIAGNOSIS — I10 Essential (primary) hypertension: Secondary | ICD-10-CM

## 2015-10-04 DIAGNOSIS — J4521 Mild intermittent asthma with (acute) exacerbation: Secondary | ICD-10-CM

## 2015-10-04 DIAGNOSIS — R7303 Prediabetes: Secondary | ICD-10-CM

## 2015-10-04 LAB — GLUCOSE, POCT (MANUAL RESULT ENTRY): POC Glucose: 108 mg/dl — AB (ref 70–99)

## 2015-10-04 MED ORDER — PREDNISONE 20 MG PO TABS: 40.0000 mg | ORAL_TABLET | Freq: Every day | ORAL | Status: DC

## 2015-10-04 MED ORDER — LISINOPRIL-HYDROCHLOROTHIAZIDE 20-12.5 MG PO TABS: 1.0000 | ORAL_TABLET | Freq: Every day | ORAL | Status: DC

## 2015-10-04 MED ORDER — HYDROCODONE-ACETAMINOPHEN 5-325 MG PO TABS: 1.0000 | ORAL_TABLET | Freq: Four times a day (QID) | ORAL | Status: DC | PRN

## 2015-10-04 MED ORDER — ALBUTEROL SULFATE (2.5 MG/3ML) 0.083% IN NEBU
2.5000 mg | INHALATION_SOLUTION | Freq: Once | RESPIRATORY_TRACT | Status: AC
  Administered 2015-10-04: 2.5 mg via RESPIRATORY_TRACT

## 2015-10-04 MED ORDER — ALBUTEROL SULFATE HFA 108 (90 BASE) MCG/ACT IN AERS: 1.0000 | INHALATION_SPRAY | RESPIRATORY_TRACT | Status: DC | PRN

## 2015-10-04 MED ORDER — IPRATROPIUM BROMIDE 0.02 % IN SOLN
0.5000 mg | Freq: Once | RESPIRATORY_TRACT | Status: AC
  Administered 2015-10-04: 0.5 mg via RESPIRATORY_TRACT

## 2015-10-04 MED ORDER — BECLOMETHASONE DIPROPIONATE 80 MCG/ACT IN AERS: 1.0000 | INHALATION_SPRAY | Freq: Two times a day (BID) | RESPIRATORY_TRACT | Status: DC

## 2015-10-04 NOTE — Telephone Encounter (Signed)
Called pharmacy to cancel Lisinopril/ HCTZ #90 with 3 refills/advised correct Rx is #30 with 3 refills

## 2015-10-04 NOTE — Progress Notes (Addendum)
Subjective:  This chart was scribed for Kelly Staggers, MD by [name], medical scribe at [facility].The patient was seen in exam room 01 and the patient's care was started at 3:12 PM.   Patient ID: [name], female    DOB: [birth year], 50 y.o.   MRN: [id number] Chief Complaint  Patient presents with  . Cough    x 5 days   . chest congestion    x 2 days   . Back Pain    upper  . Wheezing   Allean Found, MD  HPI HPI Comments: [name] is a 50 y.o. female with a history of asthma who presents to [facility] complaining of a cough for the past five days with associated chest congestion and wheezing. The cough worsened two days ago. Yesterday she injured her upper back due to heavy coughing spells. Last measured temp was 99.6 this morning. Sick contacts, her daughter had bronchitis. She did not get the flu shot. She used her albuterol inhaler for relief, typically she uses this 2-3 times a month. Albuterol last used this morning for little relief. Occasional wakes up at night with SOB. Quit smoking several years ago.   Borderline A1c in March 2015 of 5.9  Has not seen her primary care provider recently due to lack of insurance, also history of hypertension and supposed to take lisinopril/hct - taking friend's rx of lisinopril at 20mg  (not with HCTZ, no recent ov with her PCP.  Takes about 2 months out of every 3 months.  Noted this on recent visit with her neurologist where blood pressure was 150/90.  Outside BP 164/105 on friends lisinopril only. Not taking HCTZ.     Patient Active Problem List   Diagnosis Date Noted  . 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 Seizures (HCC)   . Asthma   . Hypertension   . Hyperlipemia    Past Surgical History  Procedure Laterality Date  . Abdominal hysterectomy    . Cholecystectomy      Allergies  Allergen Reactions  . Contrast Media [Iodinated Diagnostic Agents] Anaphylaxis    Contrast Dye   . Codeine   . Iohexol      Desc: UNABLE TO BREATH   . Other Other (See Comments)    Cashews and pistachios; reaction unknown   Prior to Admission medications   Medication Sig Start Date End Date Taking? Authorizing Provider  esomeprazole (NEXIUM) 20 MG packet Take 20 mg by mouth daily before breakfast.   Yes Historical Provider, MD  Fish Oil-Cholecalciferol (FISH OIL + D3) 1000-1000 MG-UNIT CAPS Take by mouth.   Yes Historical Provider, MD  levETIRAcetam (KEPPRA) 500 MG tablet One tablet in the morning and two tablets at bedtime 07/28/15  Yes Nilda Riggs, NP  Multiple Vitamin (MULTIVITAMIN) tablet Take 1 tablet by mouth daily.   Yes Historical Provider, MD   Social History   Social History  . Marital Status: Widowed    Spouse Name: N/A  . Number of Children: 2  . Years of Education: 12   Occupational History  . Not on file.   Social History Main Topics  . Smoking status: Former Games developer  . Smokeless tobacco: Never Used  . Alcohol Use: 1.2 oz/week    2 Cans of beer per week     Comment: beer, liquor  .  Drug Use: No  . Sexual Activity: Yes    Birth Control/ Protection: Surgical   Other Topics Concern  . Not on file   Social History Narrative   Patient is widowed.   Patient has 2 children.   Patient is currently unemployed.   Patient has a high school education.   Patient is right-handed.   Patient drinks 2 glass of caffeine daily ( soda and tea).  Review of Systems  Constitutional: Positive for fever.  HENT: Positive for congestion.   Respiratory: Positive for cough, shortness of breath and wheezing.   Musculoskeletal: Positive for back pain.      Objective:  BP 156/100 mmHg  Pulse 88  Temp(Src) 98.5 F (36.9 C) (Oral)  Resp 16  Ht 5\' 7"  (1.702 m)  Wt 182 lb (82.555 kg)  BMI 28.50 kg/m2  SpO2 97% Physical Exam  Constitutional: She is  oriented to person, place, and time. She appears well-developed and well-nourished. No distress.  HENT:  Head: Normocephalic and atraumatic.  Mouth/Throat: Posterior oropharyngeal erythema present. No oropharyngeal exudate.  No sinus tenderness.  Eyes: Pupils are equal, round, and reactive to light.  Neck: Normal range of motion.  No lymphadenopathy.  Cardiovascular: Normal rate and regular rhythm.  Exam reveals no gallop and no friction rub.   No murmur heard. Pulmonary/Chest: Effort normal. No respiratory distress.  Expiratory wheeze throughout. Few coarse breath sounds throughout.  Tender to palpation of her upper rhomboids on the right, minimal midline tenderness midthoracic, denies history of osteoporosis or compression fractures. No focal rib tenderness. Skin intact no rash.  Musculoskeletal: Normal range of motion.  Neurological: She is alert and oriented to person, place, and time.  Skin: Skin is warm and dry.  Psychiatric: She has a normal mood and affect. Her behavior is normal.  Nursing note and vitals reviewed. Peak flow: 410, 400 Predicted: 420-450  Results for orders placed or performed in visit on 10/04/15  POCT glucose (manual entry)  Result Value Ref Range   POC Glucose 108 (A) 70 - 99 mg/dl   Albuterol/Atrovent neb 1 given. Improved aeration, no wheezes, lungs were clear after the mineralized treatment. Symptomatically feels better. Slight cough.    Assessment & Plan:   [name] is a 50 y.o. female Asthmatic bronchitis, mild intermittent, with acute exacerbation - Plan: albuterol (PROVENTIL) (2.5 MG/3ML) 0.083% nebulizer solution 2.5 mg, ipratropium (ATROVENT) nebulizer solution 0.5 mg, albuterol (PROVENTIL HFA;VENTOLIN HFA) 108 (90 Base) MCG/ACT inhaler, predniSONE (DELTASONE) 20 MG tablet, beclomethasone (QVAR) 80 MCG/ACT inhaler  - Suspected mild intermittent but more persistent symptoms based on time of year and season. Currently with suspected asthmatic  bronchitis/flare of asthma without signs of bacterial infection.  - Prednisone 40 mg daily 5 days. Albuterol up to every 4 hours as needed. RTC precautions if not significantly improving the next few days, or for worsening sooner including fevers or chest pains.  -After current flare, if increased use of albuterol needed, recommend starting Qvar. I printed this out for her today, but should follow-up asthma in the next few months.   Prediabetes - Plan: POCT glucose (manual entry)  -Borderline glucose in office today. Can be followed up further with her primary care provider, but okay for prednisone temporarily.  Essential hypertension - Plan: Basic metabolic panel, lisinopril-hydrochlorothiazide (ZESTORETIC) 20-12.5 MG tablet, DISCONTINUED: lisinopril-hydrochlorothiazide (ZESTORETIC) 20-12.5 MG tablet  -Uncontrolled off of her usual medication. Did agree to write for 4 months of lisinopril/HCT as previous he prescribed given we drew a  BMP today.  Monitor outside blood pressure readings, and RTC precautions if not improved.  Chest wall pain  -Suspected chest wall/intercostal strain with coughing, and primarily located over rhomboids. Over-the-counter ibuprofen up to 600 mg every 6 hours when necessary, or short course of Lortab if needed for breakthrough pain. RTC precautions if symptoms persist this week as may needs imaging. ER chest pain precautions were discussed.  Meds ordered this encounter  Medications  . albuterol (PROVENTIL) (2.5 MG/3ML) 0.083% nebulizer solution 2.5 mg    Sig:   . ipratropium (ATROVENT) nebulizer solution 0.5 mg    Sig:   . albuterol (PROVENTIL HFA;VENTOLIN HFA) 108 (90 Base) MCG/ACT inhaler    Sig: Inhale 1-2 puffs into the lungs every 4 (four) hours as needed for wheezing or shortness of breath.    Dispense:  1 Inhaler    Refill:  0  . predniSONE (DELTASONE) 20 MG tablet    Sig: Take 2 tablets (40 mg total) by mouth daily with breakfast.    Dispense:  10 tablet      Refill:  0  . DISCONTD: lisinopril-hydrochlorothiazide (ZESTORETIC) 20-12.5 MG tablet    Sig: Take 1 tablet by mouth daily.    Dispense:  90 tablet    Refill:  3  . beclomethasone (QVAR) 80 MCG/ACT inhaler    Sig: Inhale 1 puff into the lungs 2 (two) times daily.    Dispense:  1 Inhaler    Refill:  3  . lisinopril-hydrochlorothiazide (ZESTORETIC) 20-12.5 MG tablet    Sig: Take 1 tablet by mouth daily.    Dispense:  30 tablet    Refill:  3  . HYDROcodone-acetaminophen (NORCO/VICODIN) 5-325 MG tablet    Sig: Take 1 tablet by mouth every 6 (six) hours as needed for moderate pain.    Dispense:  15 tablet    Refill:  0   Patient Instructions  I suspect you have an asthma flair from initial virus or cold infection. Your lungs did clear to up with nebulizer treatment, so doubt infection at this time. Start prednisone 40 mg a day for 5 days with asthma flare, albuterol as needed up to every 4 hours, but if you are not improving within the next 2-3 days, or any worsening sooner such as fever or increased shortness of breath or chest pain, be seen here or the emergency room right away.  After current asthma flare, if albuterol is needed more than 2 times per week, or more than one nighttime symptom per month, would recommend starting Qvar inhaled twice per day. I printed this prescription in case you do need to start this, but recommend following up your asthma here or primary care provider in the next 3-6 months.  Your chest wall pain is likely from coughing and pain in the muscles, but if this is not improving this week, would recommend x-rays as we discussed. Ibuprofen up to 600 mg every 6 hours as needed with food, and i did prescribe a few hydrocodone if needed for more severe breakthrough pain. Go to the emergency room if any increased chest pain.  Blood pressure is uncontrolled today on just the lisinopril 20 mg. I did check some electrolytes so that we can prescribe some medication for the  next few months, but you need to have blood pressure followed up here or with primary provider within the next few months to make sure that this is the appropriate dose.  Return to the clinic or go to the nearest emergency  room if any of your symptoms worsen or new symptoms occur.   Asthma, Acute Bronchospasm Acute bronchospasm caused by asthma is also referred to as an asthma attack. Bronchospasm means your air passages become narrowed. The narrowing is caused by inflammation and tightening of the muscles in the air tubes (bronchi) in your lungs. This can make it hard to breathe or cause you to wheeze and cough. CAUSES Possible triggers are:  Animal dander from the skin, hair, or feathers of animals.  Dust mites contained in house dust.  Cockroaches.  Pollen from trees or grass.  Mold.  Cigarette or tobacco smoke.  Air pollutants such as dust, household cleaners, hair sprays, aerosol sprays, paint fumes, strong chemicals, or strong odors.  Cold air or weather changes. Cold air may trigger inflammation. Winds increase molds and pollens in the air.  Strong emotions such as crying or laughing hard.  Stress.  Certain medicines such as aspirin or beta-blockers.  Sulfites in foods and drinks, such as dried fruits and wine.  Infections or inflammatory conditions, such as a flu, cold, or inflammation of the nasal membranes (rhinitis).  Gastroesophageal reflux disease (GERD). GERD is a condition where stomach acid backs up into your esophagus.  Exercise or strenuous activity. SIGNS AND SYMPTOMS   Wheezing.  Excessive coughing, particularly at night.  Chest tightness.  Shortness of breath. DIAGNOSIS  Your health care provider will ask you about your medical history and perform a physical exam. A chest X-ray or blood testing may be performed to look for other causes of your symptoms or other conditions that may have triggered your asthma attack. TREATMENT  Treatment is aimed  at reducing inflammation and opening up the airways in your lungs. Most asthma attacks are treated with inhaled medicines. These include quick relief or rescue medicines (such as bronchodilators) and controller medicines (such as inhaled corticosteroids). These medicines are sometimes given through an inhaler or a nebulizer. Systemic steroid medicine taken by mouth or given through an IV tube also can be used to reduce the inflammation when an attack is moderate or severe. Antibiotic medicines are only used if a bacterial infection is present.  HOME CARE INSTRUCTIONS   Rest.  Drink plenty of liquids. This helps the mucus to remain thin and be easily coughed up. Only use caffeine in moderation and do not use alcohol until you have recovered from your illness.  Do not smoke. Avoid being exposed to secondhand smoke.  You play a critical role in keeping yourself in good health. Avoid exposure to things that cause you to wheeze or to have breathing problems.  Keep your medicines up-to-date and available. Carefully follow your health care provider's treatment plan.  Take your medicine exactly as prescribed.  When pollen or pollution is bad, keep windows closed and use an air conditioner or go to places with air conditioning.  Asthma requires careful medical care. See your health care provider for a follow-up as advised. If you are more than [redacted] weeks pregnant and you were prescribed any new medicines, let your obstetrician know about the visit and how you are doing. Follow up with your health care provider as directed.  After you have recovered from your asthma attack, make an appointment with your outpatient doctor to talk about ways to reduce the likelihood of future attacks. If you do not have a doctor who manages your asthma, make an appointment with a primary care doctor to discuss your asthma. SEEK IMMEDIATE MEDICAL CARE IF:   You  are getting worse.  You have trouble breathing. If severe,  call your local emergency services (911 in the [place]  You develop chest pain or discomfort.  You are vomiting.  You are not able to keep fluids down.  You are coughing up yellow, green, brown, or bloody sputum.  You have a fever and your symptoms suddenly get worse.  You have trouble swallowing. MAKE SURE YOU:   Understand these instructions.  Will watch your condition.  Will get help right away if you are not doing well or get worse.   This information is not intended to replace advice given to you by your health care provider. Make sure you discuss any questions you have with your health care provider.   Document Released: 12/14/2006 Document Revised: 09/03/2013 Document Reviewed: 03/06/2013 Elsevier Interactive Patient Education 2016 Elsevier Inc.  Chest Wall Pain Chest wall pain is pain in or around the bones and muscles of your chest. Sometimes, an injury causes this pain. Sometimes, the cause may not be known. This pain may take several weeks or longer to get better. HOME CARE INSTRUCTIONS  Pay attention to any changes in your symptoms. Take these actions to help with your pain:   Rest as told by your health care provider.   Avoid activities that cause pain. These include any activities that use your chest muscles or your abdominal and side muscles to lift heavy items.   If directed, apply ice to the painful area:  Put ice in a plastic bag.  Place a towel between your skin and the bag.  Leave the ice on for 20 minutes, 2-3 times per day.  Take over-the-counter and prescription medicines only as told by your health care provider.  Do not use tobacco products, including cigarettes, chewing tobacco, and e-cigarettes. If you need help quitting, ask your health care provider.  Keep all follow-up visits as told by your health care provider. This is important. SEEK MEDICAL CARE IF:  You have a fever.  Your chest pain becomes worse.  You have new  symptoms. SEEK IMMEDIATE MEDICAL CARE IF:  You have nausea or vomiting.  You feel sweaty or light-headed.  You have a cough with phlegm (sputum) or you cough up blood.  You develop shortness of breath.   This information is not intended to replace advice given to you by your health care provider. Make sure you discuss any questions you have with your health care provider.   Document Released: 08/29/2005 Document Revised: 05/20/2015 Document Reviewed: 11/24/2014 Elsevier Interactive Patient Education 2016 [employer].   Hypertension Hypertension, commonly called high blood pressure, is when the force of blood pumping through your arteries is too strong. Your arteries are the blood vessels that carry blood from your heart throughout your body. A blood pressure reading consists of a higher number over a lower number, such as 110/72. The higher number (systolic) is the pressure inside your arteries when your heart pumps. The lower number (diastolic) is the pressure inside your arteries when your heart relaxes. Ideally you want your blood pressure below 120/80. Hypertension forces your heart to work harder to pump blood. Your arteries may become narrow or stiff. Having untreated or uncontrolled hypertension can cause heart attack, stroke, kidney disease, and other problems. RISK FACTORS Some risk factors for high blood pressure are controllable. Others are not.  Risk factors you cannot control include:   Race. You may be at higher risk if you are African American.  Age. Risk increases with  age.  Gender. Men are at higher risk than women before age 44 years. After age 78, women are at higher risk than men. Risk factors you can control include:  Not getting enough exercise or physical activity.  Being overweight.  Getting too much fat, sugar, calories, or salt in your diet.  Drinking too much alcohol. SIGNS AND SYMPTOMS Hypertension does not usually cause signs or symptoms.  Extremely high blood pressure (hypertensive crisis) may cause headache, anxiety, shortness of breath, and nosebleed. DIAGNOSIS To check if you have hypertension, your health care provider will measure your blood pressure while you are seated, with your arm held at the level of your heart. It should be measured at least twice using the same arm. Certain conditions can cause a difference in blood pressure between your right and left arms. A blood pressure reading that is higher than normal on one occasion does not mean that you need treatment. If it is not clear whether you have high blood pressure, you may be asked to return on a different day to have your blood pressure checked again. Or, you may be asked to monitor your blood pressure at home for 1 or more weeks. TREATMENT Treating high blood pressure includes making lifestyle changes and possibly taking medicine. Living a healthy lifestyle can help lower high blood pressure. You may need to change some of your habits. Lifestyle changes may include:  Following the DASH diet. This diet is high in fruits, vegetables, and whole grains. It is low in salt, red meat, and added sugars.  Keep your sodium intake below 2,300 mg per day.  Getting at least 30-45 minutes of aerobic exercise at least 4 times per week.  Losing weight if necessary.  Not smoking.  Limiting alcoholic beverages.  Learning ways to reduce stress. Your health care provider may prescribe medicine if lifestyle changes are not enough to get your blood pressure under control, and if one of the following is true:  You are 81-36 years of age and your systolic blood pressure is above 140.  You are 20 years of age or older, and your systolic blood pressure is above 150.  Your diastolic blood pressure is above 90.  You have diabetes, and your systolic blood pressure is over 140 or your diastolic blood pressure is over 90.  You have kidney disease and your blood pressure is above  140/90.  You have heart disease and your blood pressure is above 140/90. Your personal target blood pressure may vary depending on your medical conditions, your age, and other factors. HOME CARE INSTRUCTIONS  Have your blood pressure rechecked as directed by your health care provider.   Take medicines only as directed by your health care provider. Follow the directions carefully. Blood pressure medicines must be taken as prescribed. The medicine does not work as well when you skip doses. Skipping doses also puts you at risk for problems.  Do not smoke.   Monitor your blood pressure at home as directed by your health care provider. SEEK MEDICAL CARE IF:   You think you are having a reaction to medicines taken.  You have recurrent headaches or feel dizzy.  You have swelling in your ankles.  You have trouble with your vision. SEEK IMMEDIATE MEDICAL CARE IF:  You develop a severe headache or confusion.  You have unusual weakness, numbness, or feel faint.  You have severe chest or abdominal pain.  You vomit repeatedly.  You have trouble breathing. MAKE SURE YOU:  Understand these instructions.  Will watch your condition.  Will get help right away if you are not doing well or get worse.   This information is not intended to replace advice given to you by your health care provider. Make sure you discuss any questions you have with your health care provider.   Document Released: 08/29/2005 Document Revised: 01/13/2015 Document Reviewed: 06/21/2013 Elsevier Interactive Patient Education [employer]      I personally performed the services described in this documentation, which was scribed in my presence. The recorded information has been reviewed and considered, and addended by me as needed.     By signing my name below, I, Nadim Abuhashem, attest that this documentation has been prepared under the direction and in the presence of Kelly Staggers, MD.   Electronically Signed: Conchita Paris, medical scribe. 10/04/2015, 3:23 PM.

## 2015-10-04 NOTE — Patient Instructions (Addendum)
I suspect you have an asthma flair from initial virus or cold infection. Your lungs did clear to up with nebulizer treatment, so doubt infection at this time. Start prednisone 40 mg a day for 5 days with asthma flare, albuterol as needed up to every 4 hours, but if you are not improving within the next 2-3 days, or any worsening sooner such as fever or increased shortness of breath or chest pain, be seen here or the emergency room right away.  After current asthma flare, if albuterol is needed more than 2 times per week, or more than one nighttime symptom per month, would recommend starting Qvar inhaled twice per day. I printed this prescription in case you do need to start this, but recommend following up your asthma here or primary care provider in the next 3-6 months.  Your chest wall pain is likely from coughing and pain in the muscles, but if this is not improving this week, would recommend x-rays as we discussed. Ibuprofen up to 600 mg every 6 hours as needed with food, and i did prescribe a few hydrocodone if needed for more severe breakthrough pain. Go to the emergency room if any increased chest pain.  Blood pressure is uncontrolled today on just the lisinopril 20 mg. I did check some electrolytes so that we can prescribe some medication for the next few months, but you need to have blood pressure followed up here or with primary provider within the next few months to make sure that this is the appropriate dose.  Return to the clinic or go to the nearest emergency room if any of your symptoms worsen or new symptoms occur.   Asthma, Acute Bronchospasm Acute bronchospasm caused by asthma is also referred to as an asthma attack. Bronchospasm means your air passages become narrowed. The narrowing is caused by inflammation and tightening of the muscles in the air tubes (bronchi) in your lungs. This can make it hard to breathe or cause you to wheeze and cough. CAUSES Possible triggers are:  Animal  dander from the skin, hair, or feathers of animals.  Dust mites contained in house dust.  Cockroaches.  Pollen from trees or grass.  Mold.  Cigarette or tobacco smoke.  Air pollutants such as dust, household cleaners, hair sprays, aerosol sprays, paint fumes, strong chemicals, or strong odors.  Cold air or weather changes. Cold air may trigger inflammation. Winds increase molds and pollens in the air.  Strong emotions such as crying or laughing hard.  Stress.  Certain medicines such as aspirin or beta-blockers.  Sulfites in foods and drinks, such as dried fruits and wine.  Infections or inflammatory conditions, such as a flu, cold, or inflammation of the nasal membranes (rhinitis).  Gastroesophageal reflux disease (GERD). GERD is a condition where stomach acid backs up into your esophagus.  Exercise or strenuous activity. SIGNS AND SYMPTOMS   Wheezing.  Excessive coughing, particularly at night.  Chest tightness.  Shortness of breath. DIAGNOSIS  Your health care provider will ask you about your medical history and perform a physical exam. A chest X-ray or blood testing may be performed to look for other causes of your symptoms or other conditions that may have triggered your asthma attack. TREATMENT  Treatment is aimed at reducing inflammation and opening up the airways in your lungs. Most asthma attacks are treated with inhaled medicines. These include quick relief or rescue medicines (such as bronchodilators) and controller medicines (such as inhaled corticosteroids). These medicines are sometimes given through  an inhaler or a nebulizer. Systemic steroid medicine taken by mouth or given through an IV tube also can be used to reduce the inflammation when an attack is moderate or severe. Antibiotic medicines are only used if a bacterial infection is present.  HOME CARE INSTRUCTIONS   Rest.  Drink plenty of liquids. This helps the mucus to remain thin and be easily  coughed up. Only use caffeine in moderation and do not use alcohol until you have recovered from your illness.  Do not smoke. Avoid being exposed to secondhand smoke.  You play a critical role in keeping yourself in good health. Avoid exposure to things that cause you to wheeze or to have breathing problems.  Keep your medicines up-to-date and available. Carefully follow your health care provider's treatment plan.  Take your medicine exactly as prescribed.  When pollen or pollution is bad, keep windows closed and use an air conditioner or go to places with air conditioning.  Asthma requires careful medical care. See your health care provider for a follow-up as advised. If you are more than [redacted] weeks pregnant and you were prescribed any new medicines, let your obstetrician know about the visit and how you are doing. Follow up with your health care provider as directed.  After you have recovered from your asthma attack, make an appointment with your outpatient doctor to talk about ways to reduce the likelihood of future attacks. If you do not have a doctor who manages your asthma, make an appointment with a primary care doctor to discuss your asthma. SEEK IMMEDIATE MEDICAL CARE IF:   You are getting worse.  You have trouble breathing. If severe, call your local emergency services (911 in the [place]  You develop chest pain or discomfort.  You are vomiting.  You are not able to keep fluids down.  You are coughing up yellow, green, brown, or bloody sputum.  You have a fever and your symptoms suddenly get worse.  You have trouble swallowing. MAKE SURE YOU:   Understand these instructions.  Will watch your condition.  Will get help right away if you are not doing well or get worse.   This information is not intended to replace advice given to you by your health care provider. Make sure you discuss any questions you have with your health care provider.   Document Released: 12/14/2006  Document Revised: 09/03/2013 Document Reviewed: 03/06/2013 Elsevier Interactive Patient Education 2016 [employer]  Chest Wall Pain Chest wall pain is pain in or around the bones and muscles of your chest. Sometimes, an injury causes this pain. Sometimes, the cause may not be known. This pain may take several weeks or longer to get better. HOME CARE INSTRUCTIONS  Pay attention to any changes in your symptoms. Take these actions to help with your pain:   Rest as told by your health care provider.   Avoid activities that cause pain. These include any activities that use your chest muscles or your abdominal and side muscles to lift heavy items.   If directed, apply ice to the painful area:  Put ice in a plastic bag.  Place a towel between your skin and the bag.  Leave the ice on for 20 minutes, 2-3 times per day.  Take over-the-counter and prescription medicines only as told by your health care provider.  Do not use tobacco products, including cigarettes, chewing tobacco, and e-cigarettes. If you need help quitting, ask your health care provider.  Keep all follow-up visits as told  by your health care provider. This is important. SEEK MEDICAL CARE IF:  You have a fever.  Your chest pain becomes worse.  You have new symptoms. SEEK IMMEDIATE MEDICAL CARE IF:  You have nausea or vomiting.  You feel sweaty or light-headed.  You have a cough with phlegm (sputum) or you cough up blood.  You develop shortness of breath.   This information is not intended to replace advice given to you by your health care provider. Make sure you discuss any questions you have with your health care provider.   Document Released: 08/29/2005 Document Revised: 05/20/2015 Document Reviewed: 11/24/2014 Elsevier Interactive Patient Education 2016 [employer].   Hypertension Hypertension, commonly called high blood pressure, is when the force of blood pumping through your arteries is too strong.  Your arteries are the blood vessels that carry blood from your heart throughout your body. A blood pressure reading consists of a higher number over a lower number, such as 110/72. The higher number (systolic) is the pressure inside your arteries when your heart pumps. The lower number (diastolic) is the pressure inside your arteries when your heart relaxes. Ideally you want your blood pressure below 120/80. Hypertension forces your heart to work harder to pump blood. Your arteries may become narrow or stiff. Having untreated or uncontrolled hypertension can cause heart attack, stroke, kidney disease, and other problems. RISK FACTORS Some risk factors for high blood pressure are controllable. Others are not.  Risk factors you cannot control include:   Race. You may be at higher risk if you are African American.  Age. Risk increases with age.  Gender. Men are at higher risk than women before age 34 years. After age 62, women are at higher risk than men. Risk factors you can control include:  Not getting enough exercise or physical activity.  Being overweight.  Getting too much fat, sugar, calories, or salt in your diet.  Drinking too much alcohol. SIGNS AND SYMPTOMS Hypertension does not usually cause signs or symptoms. Extremely high blood pressure (hypertensive crisis) may cause headache, anxiety, shortness of breath, and nosebleed. DIAGNOSIS To check if you have hypertension, your health care provider will measure your blood pressure while you are seated, with your arm held at the level of your heart. It should be measured at least twice using the same arm. Certain conditions can cause a difference in blood pressure between your right and left arms. A blood pressure reading that is higher than normal on one occasion does not mean that you need treatment. If it is not clear whether you have high blood pressure, you may be asked to return on a different day to have your blood pressure checked  again. Or, you may be asked to monitor your blood pressure at home for 1 or more weeks. TREATMENT Treating high blood pressure includes making lifestyle changes and possibly taking medicine. Living a healthy lifestyle can help lower high blood pressure. You may need to change some of your habits. Lifestyle changes may include:  Following the DASH diet. This diet is high in fruits, vegetables, and whole grains. It is low in salt, red meat, and added sugars.  Keep your sodium intake below 2,300 mg per day.  Getting at least 30-45 minutes of aerobic exercise at least 4 times per week.  Losing weight if necessary.  Not smoking.  Limiting alcoholic beverages.  Learning ways to reduce stress. Your health care provider may prescribe medicine if lifestyle changes are not enough to get your  blood pressure under control, and if one of the following is true:  You are 23-10 years of age and your systolic blood pressure is above 140.  You are 5 years of age or older, and your systolic blood pressure is above 150.  Your diastolic blood pressure is above 90.  You have diabetes, and your systolic blood pressure is over 140 or your diastolic blood pressure is over 90.  You have kidney disease and your blood pressure is above 140/90.  You have heart disease and your blood pressure is above 140/90. Your personal target blood pressure may vary depending on your medical conditions, your age, and other factors. HOME CARE INSTRUCTIONS  Have your blood pressure rechecked as directed by your health care provider.   Take medicines only as directed by your health care provider. Follow the directions carefully. Blood pressure medicines must be taken as prescribed. The medicine does not work as well when you skip doses. Skipping doses also puts you at risk for problems.  Do not smoke.   Monitor your blood pressure at home as directed by your health care provider. SEEK MEDICAL CARE IF:   You think  you are having a reaction to medicines taken.  You have recurrent headaches or feel dizzy.  You have swelling in your ankles.  You have trouble with your vision. SEEK IMMEDIATE MEDICAL CARE IF:  You develop a severe headache or confusion.  You have unusual weakness, numbness, or feel faint.  You have severe chest or abdominal pain.  You vomit repeatedly.  You have trouble breathing. MAKE SURE YOU:   Understand these instructions.  Will watch your condition.  Will get help right away if you are not doing well or get worse.   This information is not intended to replace advice given to you by your health care provider. Make sure you discuss any questions you have with your health care provider.   Document Released: 08/29/2005 Document Revised: 01/13/2015 Document Reviewed: 06/21/2013 Elsevier Interactive Patient Education [employer]

## 2015-10-05 LAB — BASIC METABOLIC PANEL
BUN: 9 mg/dL (ref 7–25)
CO2: 25 mmol/L (ref 20–31)
Calcium: 9.2 mg/dL (ref 8.6–10.2)
Chloride: 98 mmol/L (ref 98–110)
Creat: 0.68 mg/dL (ref 0.50–1.10)
GLUCOSE: 117 mg/dL — AB (ref 65–99)
POTASSIUM: 3.2 mmol/L — AB (ref 3.5–5.3)
SODIUM: 138 mmol/L (ref 135–146)

## 2015-10-06 NOTE — Addendum Note (Signed)
Addended by: [name] R on: 10/06/2015 11:38 PM   Modules accepted: Orders

## 2015-10-08 ENCOUNTER — Ambulatory Visit (INDEPENDENT_AMBULATORY_CARE_PROVIDER_SITE_OTHER): Payer: Self-pay | Admitting: Family Medicine

## 2015-10-08 ENCOUNTER — Ambulatory Visit (INDEPENDENT_AMBULATORY_CARE_PROVIDER_SITE_OTHER): Payer: Self-pay

## 2015-10-08 VITALS — BP 138/88 | HR 86 | Temp 97.8°F | Resp 18 | Ht 67.75 in | Wt 179.6 lb

## 2015-10-08 DIAGNOSIS — R0609 Other forms of dyspnea: Secondary | ICD-10-CM

## 2015-10-08 DIAGNOSIS — R05 Cough: Secondary | ICD-10-CM

## 2015-10-08 DIAGNOSIS — R0600 Dyspnea, unspecified: Secondary | ICD-10-CM

## 2015-10-08 DIAGNOSIS — R0789 Other chest pain: Secondary | ICD-10-CM

## 2015-10-08 DIAGNOSIS — R0601 Orthopnea: Secondary | ICD-10-CM

## 2015-10-08 DIAGNOSIS — R059 Cough, unspecified: Secondary | ICD-10-CM

## 2015-10-08 DIAGNOSIS — R739 Hyperglycemia, unspecified: Secondary | ICD-10-CM

## 2015-10-08 DIAGNOSIS — E871 Hypo-osmolality and hyponatremia: Secondary | ICD-10-CM

## 2015-10-08 DIAGNOSIS — J45901 Unspecified asthma with (acute) exacerbation: Secondary | ICD-10-CM

## 2015-10-08 LAB — GLUCOSE, POCT (MANUAL RESULT ENTRY): POC Glucose: 132 mg/dl — AB (ref 70–99)

## 2015-10-08 LAB — POCT CBC
GRANULOCYTE PERCENT: 84 % — AB (ref 37–80)
HCT, POC: 45.2 % (ref 37.7–47.9)
HEMOGLOBIN: 15.8 g/dL (ref 12.2–16.2)
Lymph, poc: 1.7 (ref 0.6–3.4)
MCH: 31.3 pg — AB (ref 27–31.2)
MCHC: 35 g/dL (ref 31.8–35.4)
MCV: 89.5 fL (ref 80–97)
MID (cbc): 0.5 (ref 0–0.9)
MPV: 7.7 fL (ref 0–99.8)
POC Granulocyte: 11.6 — AB (ref 2–6.9)
POC LYMPH PERCENT: 12.4 %L (ref 10–50)
POC MID %: 3.6 % (ref 0–12)
Platelet Count, POC: 301 10*3/uL (ref 142–424)
RBC: 5.05 M/uL (ref 4.04–5.48)
RDW, POC: 13.2 %
WBC: 13.8 10*3/uL — AB (ref 4.6–10.2)

## 2015-10-08 MED ORDER — DOXYCYCLINE HYCLATE 100 MG PO TABS: 100.0000 mg | ORAL_TABLET | Freq: Two times a day (BID) | ORAL | Status: DC

## 2015-10-08 MED ORDER — ALBUTEROL SULFATE (2.5 MG/3ML) 0.083% IN NEBU: 2.5000 mg | INHALATION_SOLUTION | Freq: Four times a day (QID) | RESPIRATORY_TRACT | Status: DC | PRN

## 2015-10-08 MED ORDER — PREDNISONE 20 MG PO TABS: ORAL_TABLET | ORAL | Status: DC

## 2015-10-08 NOTE — Patient Instructions (Addendum)
Because you received an x-ray today, you will receive an invoice from [facility]. Please contact [facility] at [phone] with questions or concerns regarding your invoice. Our billing staff will not be able to assist you with those questions.   we can try to increase prednisone up to 60 mg per day for the next 3 days, then taper down as instructed. Start doxycycline for possible infection component, but I suspect most of this is due to asthma and inflammation. Albuterol nebulizer treatments  Or inhaler up to every 4 hours today and tomorrow, but if you are not significantly improving into tomorrow night or the latest Saturday morning, recommend evaluation through the emergency room.  Return to the clinic or go to the nearest emergency room if any of your symptoms worsen or new symptoms occur.  Asthma, Acute Bronchospasm Acute bronchospasm caused by asthma is also referred to as an asthma attack. Bronchospasm means your air passages become narrowed. The narrowing is caused by inflammation and tightening of the muscles in the air tubes (bronchi) in your lungs. This can make it hard to breathe or cause you to wheeze and cough. CAUSES Possible triggers are:  Animal dander from the skin, hair, or feathers of animals.  Dust mites contained in house dust.  Cockroaches.  Pollen from trees or grass.  Mold.  Cigarette or tobacco smoke.  Air pollutants such as dust, household cleaners, hair sprays, aerosol sprays, paint fumes, strong chemicals, or strong odors.  Cold air or weather changes. Cold air may trigger inflammation. Winds increase molds and pollens in the air.  Strong emotions such as crying or laughing hard.  Stress.  Certain medicines such as aspirin or beta-blockers.  Sulfites in foods and drinks, such as dried fruits and wine.  Infections or inflammatory conditions, such as a flu, cold, or inflammation of the nasal membranes (rhinitis).  Gastroesophageal  reflux disease (GERD). GERD is a condition where stomach acid backs up into your esophagus.  Exercise or strenuous activity. SIGNS AND SYMPTOMS   Wheezing.  Excessive coughing, particularly at night.  Chest tightness.  Shortness of breath. DIAGNOSIS  Your health care provider will ask you about your medical history and perform a physical exam. A chest X-ray or blood testing may be performed to look for other causes of your symptoms or other conditions that may have triggered your asthma attack. TREATMENT  Treatment is aimed at reducing inflammation and opening up the airways in your lungs. Most asthma attacks are treated with inhaled medicines. These include quick relief or rescue medicines (such as bronchodilators) and controller medicines (such as inhaled corticosteroids). These medicines are sometimes given through an inhaler or a nebulizer. Systemic steroid medicine taken by mouth or given through an IV tube also can be used to reduce the inflammation when an attack is moderate or severe. Antibiotic medicines are only used if a bacterial infection is present.  HOME CARE INSTRUCTIONS   Rest.  Drink plenty of liquids. This helps the mucus to remain thin and be easily coughed up. Only use caffeine in moderation and do not use alcohol until you have recovered from your illness.  Do not smoke. Avoid being exposed to secondhand smoke.  You play a critical role in keeping yourself in good health. Avoid exposure to things that cause you to wheeze or to have breathing problems.  Keep your medicines up-to-date and available. Carefully follow your health care provider's treatment plan.  Take your medicine exactly as prescribed.  When pollen or pollution  is bad, keep windows closed and use an air conditioner or go to places with air conditioning.  Asthma requires careful medical care. See your health care provider for a follow-up as advised. If you are more than [redacted] weeks pregnant and you  were prescribed any new medicines, let your obstetrician know about the visit and how you are doing. Follow up with your health care provider as directed.  After you have recovered from your asthma attack, make an appointment with your outpatient doctor to talk about ways to reduce the likelihood of future attacks. If you do not have a doctor who manages your asthma, make an appointment with a primary care doctor to discuss your asthma. SEEK IMMEDIATE MEDICAL CARE IF:   You are getting worse.  You have trouble breathing. If severe, call your local emergency services (911 in the [place]  You develop chest pain or discomfort.  You are vomiting.  You are not able to keep fluids down.  You are coughing up yellow, green, brown, or bloody sputum.  You have a fever and your symptoms suddenly get worse.  You have trouble swallowing. MAKE SURE YOU:   Understand these instructions.  Will watch your condition.  Will get help right away if you are not doing well or get worse.   This information is not intended to replace advice given to you by your health care provider. Make sure you discuss any questions you have with your health care provider.   Document Released: 12/14/2006 Document Revised: 09/03/2013 Document Reviewed: 03/06/2013 Elsevier Interactive Patient Education [employer]

## 2015-10-08 NOTE — Progress Notes (Addendum)
Subjective:  By signing my name below, I, Rawaa Al Rifaie, attest that this documentation has been prepared under the direction and in the presence of [name], MD.  Broadus John, Medical Scribe. 10/08/2015.  1:28 PM.  I personally performed the services described in this documentation, which was scribed in my presence. The recorded information has been reviewed and considered, and addended by me as needed.    Patient ID: [name], female    DOB: 06/15/1966, 50 y.o.   MRN: [id number]  Chief Complaint  Patient presents with  . Follow-up    for wheezing and discussion on blood work done on 10/04/15    HPI HPI Comments: [name] is a 50 y.o. female who presents to [facility] for a follow up of asthmatic bronchitis.  Pt was seen here 4 days ago. Started on prednisone 40 mg for 5 days, albuterol if needed, Ibuprofen for chest wall pain.   BNP as checked for HTN, potassium levels were low at 3.2.   Today, pt reports that her symptoms have not improved much. She is still experiencing cough, wheezing, and chest wall pain in the frontal right rib area, and lower right back area. She indicates that she uses her albuterol about 2X puffs 4 times per day, with not much relief, therefore pt did not use it today. Pt indicates that she started experiencing new symptoms of cold sweats, fatigue, trouble breathing when is a supine position. She also reports that she experienced shortness of breath with exertion such as walking. Pt is compliant with taking the prednisone She denies subjective fever. Pt declined a nebulizer treatment here at the office today due to not resolving her symptoms last time she had it done.    Patient Active Problem List   Diagnosis Date Noted  . 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 Seizures (HCC)   . Asthma   . Hypertension   . Hyperlipemia     Past Surgical History  Procedure Laterality Date  . Abdominal hysterectomy    . Cholecystectomy     Allergies  Allergen Reactions  . Contrast Media [Iodinated Diagnostic Agents] Anaphylaxis    Contrast Dye   . Codeine   . Iohexol      Desc: UNABLE TO BREATH   . Other Other (See Comments)    Cashews and pistachios; reaction unknown   Prior to Admission medications   Medication Sig Start Date End Date Taking? Authorizing Provider  albuterol (PROVENTIL HFA;VENTOLIN HFA) 108 (90 Base) MCG/ACT inhaler Inhale 1-2 puffs into the lungs every 4 (four) hours as needed for wheezing or shortness of breath. 10/04/15  Yes [name], MD  beclomethasone (QVAR) 80 MCG/ACT inhaler Inhale 1 puff into the lungs 2 (two) times daily. 10/04/15  Yes [name], MD  esomeprazole (NEXIUM) 20 MG packet Take 20 mg by mouth daily before breakfast.   Yes Historical Provider, MD  Fish Oil-Cholecalciferol (FISH OIL + D3) 1000-1000 MG-UNIT CAPS Take by mouth.   Yes Historical Provider, MD  HYDROcodone-acetaminophen (NORCO/VICODIN) 5-325 MG tablet Take 1 tablet by mouth every 6 (six) hours as needed for moderate pain. 10/04/15  Yes [name], MD  levETIRAcetam (KEPPRA) 500 MG tablet One tablet in the morning and two tablets at bedtime 07/28/15  Yes Nilda Riggs, NP  lisinopril-hydrochlorothiazide (ZESTORETIC) 20-12.5 MG tablet Take 1 tablet  by mouth daily. 10/04/15  Yes [name], MD  Multiple Vitamin (MULTIVITAMIN) tablet Take 1 tablet by mouth daily.   Yes Historical Provider, MD  predniSONE (DELTASONE) 20 MG tablet Take 2 tablets (40 mg total) by mouth daily with breakfast. 10/04/15  Yes [name], MD   Social History   Social History  . Marital Status: Widowed    Spouse Name: N/A  . Number of Children: 2  . Years of Education: 12   Occupational History  . Not on file.   Social History Main Topics  . Smoking status: Former Games developer  . Smokeless tobacco: Never Used  .  Alcohol Use: 1.2 oz/week    2 Cans of beer per week     Comment: beer, liquor  . Drug Use: No  . Sexual Activity: Yes    Birth Control/ Protection: Surgical   Other Topics Concern  . Not on file   Social History Narrative   Patient is widowed.   Patient has 2 children.   Patient is currently unemployed.   Patient has a high school education.   Patient is right-handed.   Patient drinks 2 glass of caffeine daily ( soda and tea).    Review of Systems  Constitutional: Positive for chills, diaphoresis and fatigue. Negative for fever.  Respiratory: Positive for cough, shortness of breath and wheezing.   Cardiovascular: Positive for chest pain (In the chestwall).      Objective:   Physical Exam  Constitutional: She is oriented to person, place, and time. She appears well-developed and well-nourished. No distress.  HENT:  Head: Normocephalic and atraumatic.  Eyes: EOM are normal. Pupils are equal, round, and reactive to light.  Neck: Neck supple.  Cardiovascular: Normal rate, regular rhythm and normal heart sounds.  Exam reveals no gallop and no friction rub.   No murmur heard. Pulmonary/Chest: Effort normal. She has wheezes (diffused throughout).  Decreased breath sounds.  Reproducible pain in the right lower rib margin as well as the lower thoracic on the right and the right rib margin on the back.   Neurological: She is alert and oriented to person, place, and time. No cranial nerve deficit.  Skin: Skin is warm and dry.  Psychiatric: She has a normal mood and affect. Her behavior is normal.  Nursing note and vitals reviewed.   BP 138/88 mmHg  Pulse 86  Temp(Src) 97.8 F (36.6 C) (Oral)  Resp 18  Ht 5' 7.75" (1.721 m)  Wt 179 lb 9.6 oz (81.466 kg)  BMI 27.51 kg/m2  SpO2 94%    Results for orders placed or performed in visit on 10/08/15  POCT CBC  Result Value Ref Range   WBC 13.8 (A) 4.6 - 10.2 K/uL   Lymph, poc 1.7 0.6 - 3.4   POC LYMPH PERCENT 12.4 10 - 50 %L    MID (cbc) 0.5 0 - 0.9   POC MID % 3.6 0 - 12 %M   POC Granulocyte 11.6 (A) 2 - 6.9   Granulocyte percent 84.0 (A) 37 - 80 %G   RBC 5.05 4.04 - 5.48 M/uL   Hemoglobin 15.8 12.2 - 16.2 g/dL   HCT, POC 16.1 09.6 - 47.9 %   MCV 89.5 80 - 97 fL   MCH, POC 31.3 (A) 27 - 31.2 pg   MCHC 35.0 31.8 - 35.4 g/dL   RDW, POC 04.5 %   Platelet Count, POC 301 142 - 424 K/uL   MPV 7.7 0 - 99.8 fL  POCT glucose (manual entry)  Result Value Ref Range   POC Glucose 132 (A) 70 - 99 mg/dl   ambulatory pulse ox low of 93% Peak flow reading is 350, about 77-83 % of predicted.(420-450)     Assessment & Plan:   GAVINA DILDINE is a 50 y.o. female Asthmatic bronchitis, unspecified asthma severity, with acute exacerbation - Plan: predniSONE (DELTASONE) 20 MG tablet, doxycycline (VIBRA-TABS) 100 MG tablet, albuterol (PROVENTIL) (2.5 MG/3ML) 0.083% nebulizer solution Orthopnea - Plan: DG Chest 2 View, Brain natriuretic peptide DOE (dyspnea on exertion) - Plan: DG Chest 2 View, Brain natriuretic peptide Cough - Plan: DG Chest 2 View, POCT CBC  -Still suspected asthma flare, that has not significantly improved with 40 mg daily of prednisone and albuterol inhaler 4 times a day. Offered nebulizer treatment in office but this was declined. Also discussed emergency room evaluation for possible continuous neb versus admission, but she would like to try higher dose of prednisone temporarily, albuterol nebulizer treatment at home, with ER precautions if not significantly improving the next 24-48 hours. Plan to go to ER sooner if nebulizers are not controlling her symptoms within 4 hours, or other worsening.  -Albuterol nebs every 4 hours when necessary, she will use friend's nebulizer temporarily.  -Increase prednisone to 60 mg daily for the next 3 days, then taper as below.  -Doxycycline for possible atypical infection contributor, but chest x-ray appears normal.  -Doubt CHF, but BNP Pending. Hyponatremia  - Repeat BMP  pending.  Chest wall pain - Plan: DG Chest 2 View  -Continue Tylenol or ibuprofen over-the-counter as needed. Symptomatic care, RTC precautions.  Hyperglycemia - Plan: POCT glucose (manual entry)  -Slightly increased on prednisone. Can follow-up on patient for further pre-diabetes testing if needed. Side effects discussed again for prednisone.  Meds ordered this encounter  Medications  . predniSONE (DELTASONE) 20 MG tablet    Sig: 3 by mouth for 3 days, then 2 by mouth for 2 days, then 1 by mouth for 2 days, then 1/2 by mouth for 2 days.    Dispense:  16 tablet    Refill:  0  . doxycycline (VIBRA-TABS) 100 MG tablet    Sig: Take 1 tablet (100 mg total) by mouth 2 (two) times daily.    Dispense:  20 tablet    Refill:  0  . albuterol (PROVENTIL) (2.5 MG/3ML) 0.083% nebulizer solution    Sig: Take 3 mLs (2.5 mg total) by nebulization every 6 (six) hours as needed for wheezing or shortness of breath.    Dispense:  75 mL    Refill:  0   Patient Instructions   Because you received an x-ray today, you will receive an invoice from [facility]. Please contact [facility] at [phone] with questions or concerns regarding your invoice. Our billing staff will not be able to assist you with those questions.   we can try to increase prednisone up to 60 mg per day for the next 3 days, then taper down as instructed. Start doxycycline for possible infection component, but I suspect most of this is due to asthma and inflammation. Albuterol nebulizer treatments  Or inhaler up to every 4 hours today and tomorrow, but if you are not significantly improving into tomorrow night or the latest Saturday morning, recommend evaluation through the emergency room.  Return to the clinic or go to the nearest emergency room if any of your symptoms worsen or new symptoms occur.  Asthma, Acute Bronchospasm Acute bronchospasm caused  by asthma is also referred to as an asthma attack. Bronchospasm  means your air passages become narrowed. The narrowing is caused by inflammation and tightening of the muscles in the air tubes (bronchi) in your lungs. This can make it hard to breathe or cause you to wheeze and cough. CAUSES Possible triggers are:  Animal dander from the skin, hair, or feathers of animals.  Dust mites contained in house dust.  Cockroaches.  Pollen from trees or grass.  Mold.  Cigarette or tobacco smoke.  Air pollutants such as dust, household cleaners, hair sprays, aerosol sprays, paint fumes, strong chemicals, or strong odors.  Cold air or weather changes. Cold air may trigger inflammation. Winds increase molds and pollens in the air.  Strong emotions such as crying or laughing hard.  Stress.  Certain medicines such as aspirin or beta-blockers.  Sulfites in foods and drinks, such as dried fruits and wine.  Infections or inflammatory conditions, such as a flu, cold, or inflammation of the nasal membranes (rhinitis).  Gastroesophageal reflux disease (GERD). GERD is a condition where stomach acid backs up into your esophagus.  Exercise or strenuous activity. SIGNS AND SYMPTOMS   Wheezing.  Excessive coughing, particularly at night.  Chest tightness.  Shortness of breath. DIAGNOSIS  Your health care provider will ask you about your medical history and perform a physical exam. A chest X-ray or blood testing may be performed to look for other causes of your symptoms or other conditions that may have triggered your asthma attack. TREATMENT  Treatment is aimed at reducing inflammation and opening up the airways in your lungs. Most asthma attacks are treated with inhaled medicines. These include quick relief or rescue medicines (such as bronchodilators) and controller medicines (such as inhaled corticosteroids). These medicines are sometimes given through an inhaler or a nebulizer. Systemic steroid medicine taken by mouth or given through an IV tube also can  be used to reduce the inflammation when an attack is moderate or severe. Antibiotic medicines are only used if a bacterial infection is present.  HOME CARE INSTRUCTIONS   Rest.  Drink plenty of liquids. This helps the mucus to remain thin and be easily coughed up. Only use caffeine in moderation and do not use alcohol until you have recovered from your illness.  Do not smoke. Avoid being exposed to secondhand smoke.  You play a critical role in keeping yourself in good health. Avoid exposure to things that cause you to wheeze or to have breathing problems.  Keep your medicines up-to-date and available. Carefully follow your health care provider's treatment plan.  Take your medicine exactly as prescribed.  When pollen or pollution is bad, keep windows closed and use an air conditioner or go to places with air conditioning.  Asthma requires careful medical care. See your health care provider for a follow-up as advised. If you are more than [redacted] weeks pregnant and you were prescribed any new medicines, let your obstetrician know about the visit and how you are doing. Follow up with your health care provider as directed.  After you have recovered from your asthma attack, make an appointment with your outpatient doctor to talk about ways to reduce the likelihood of future attacks. If you do not have a doctor who manages your asthma, make an appointment with a primary care doctor to discuss your asthma. SEEK IMMEDIATE MEDICAL CARE IF:   You are getting worse.  You have trouble breathing. If severe, call your local emergency services (911 in the  [place]  You develop chest pain or discomfort.  You are vomiting.  You are not able to keep fluids down.  You are coughing up yellow, green, brown, or bloody sputum.  You have a fever and your symptoms suddenly get worse.  You have trouble swallowing. MAKE SURE YOU:   Understand these instructions.  Will watch your condition.  Will get help  right away if you are not doing well or get worse.   This information is not intended to replace advice given to you by your health care provider. Make sure you discuss any questions you have with your health care provider.   Document Released: 12/14/2006 Document Revised: 09/03/2013 Document Reviewed: 03/06/2013 Elsevier Interactive Patient Education [employer]     I personally performed the services described in this documentation, which was scribed in my presence. The recorded information has been reviewed and considered, and addended by me as needed.

## 2015-10-09 LAB — BRAIN NATRIURETIC PEPTIDE: Brain Natriuretic Peptide: 9.6 pg/mL (ref 0.0–100.0)

## 2016-07-27 ENCOUNTER — Ambulatory Visit (INDEPENDENT_AMBULATORY_CARE_PROVIDER_SITE_OTHER): Payer: Self-pay | Admitting: Nurse Practitioner

## 2016-07-27 ENCOUNTER — Encounter: Payer: Self-pay | Admitting: Nurse Practitioner

## 2016-07-27 VITALS — BP 178/118 | HR 72 | Ht 67.75 in

## 2016-07-27 DIAGNOSIS — G40309 Generalized idiopathic epilepsy and epileptic syndromes, not intractable, without status epilepticus: Secondary | ICD-10-CM

## 2016-07-27 NOTE — Patient Instructions (Signed)
Continue Keppra at current dose will refill for one year. Call for any seizure activity Follow-up yearly and when necessary 

## 2016-07-27 NOTE — Progress Notes (Signed)
[facility]  PATIENT: [name] DOB: 06/20/1966   REASON FOR VISIT: Follow-up for epilepsy HISTORY FROM: Patient    HISTORY OF PRESENT ILLNESS:[name], 50 year old white female returns for followup. She has history of epilepsy with last seizure 11/29/2015 after being stressed over a close a friends death. Prior to that her last seizure was Jan 17, 2013 after missing several doses of her Keppra medication. She is currently on Keppra 500 mg a.m. and 1000 PM. She denies missing any doses of medication. She denies any side effects to the drug. She returns for reevaluation . Her blood pressure was noted to be elevated today however she states she cannot afford her blood pressure medication.  HISTORY: This 50 year old cauc. right handed female presents with sudden onset of "seizures" fits, that started with an episode on May 12th at home, in 8 AM. She lost her husband on April 6th, and had not been able to stay in the marital home, slept at her sisters. Her husband suffered a sudden cardiac death in the home, in the presence of several family members. She came to her own house on 7812 th may, in the morning to get the post from her letter box. Than she has a memory gap- She found herself on the floor, was surprised to "sleep on the floor" , nobody witnessed this in effect. She had lots of bruising, no incontinence/ no tongue bite , but shoulder pain. She went to the PCP the next day, Xray of the shoulder showed no bone injuries, she did not want to have CT head taken, because of insurance difficulties.  On the 13 th April 10 she had a second spell, this is a Thursday night, she had gone with her sister to her house, she entered the house "fell suddenly,,with herlips blue, her eyes rolling," she fell into a glass table, suff. injuries plus urine and stool incontinence, [facility] 3 minutes of generalized convusions. She was released without further work up, had  severe bruising in the face and right shouler. Her sister states she believes the seizure lasted 7-8 minutes - a rather unlikely duration.Next day she developed a fever, [facility], this time had a CT of the brain and lab work- with no abnormal results, except aspiration pneumonia.     REVIEW OF SYSTEMS: Full 14 system review of systems performed and notable only for those listed, all others are neg:  Constitutional: neg  Cardiovascular: neg Ear/Nose/Throat: neg  Skin: neg Eyes: neg Respiratory: , Cough Gastroitestinal: neg  Hematology/Lymphatic: neg  Endocrine: neg Musculoskeletal:neg Allergy/Immunology: Seasonal allergies Neurological: Seizure disorder Psychiatric:  Sleep : neg   ALLERGIES: Allergies  Allergen Reactions  . Contrast Media [Iodinated Diagnostic Agents] Anaphylaxis    Contrast Dye   . Codeine   . Iohexol      Desc: UNABLE TO BREATH   . Other Other (See Comments)    Cashews and pistachios; reaction unknown    HOME MEDICATIONS: Outpatient Medications Prior to Visit  Medication Sig Dispense Refill  . esomeprazole (NEXIUM) 20 MG packet Take 20 mg by mouth daily before breakfast.    . Fish Oil-Cholecalciferol (FISH OIL + D3) 1000-1000 MG-UNIT CAPS Take by mouth.    . levETIRAcetam (KEPPRA) 500 MG tablet One tablet in the morning and two tablets at bedtime 90 tablet 11  . albuterol (PROVENTIL HFA;VENTOLIN HFA) 108 (90 Base) MCG/ACT inhaler Inhale 1-2 puffs into the lungs every  4 (four) hours as needed for wheezing or shortness of breath. (Patient not taking: Reported on 07/27/2016) 1 Inhaler 0  . albuterol (PROVENTIL) (2.5 MG/3ML) 0.083% nebulizer solution Take 3 mLs (2.5 mg total) by nebulization every 6 (six) hours as needed for wheezing or shortness of breath. (Patient not taking: Reported on 07/27/2016) 75 mL 0  . beclomethasone (QVAR) 80 MCG/ACT inhaler Inhale 1 puff into the lungs 2 (two) times daily. (Patient not taking: Reported  on 07/27/2016) 1 Inhaler 3  . HYDROcodone-acetaminophen (NORCO/VICODIN) 5-325 MG tablet Take 1 tablet by mouth every 6 (six) hours as needed for moderate pain. (Patient not taking: Reported on 07/27/2016) 15 tablet 0  . lisinopril-hydrochlorothiazide (ZESTORETIC) 20-12.5 MG tablet Take 1 tablet by mouth daily. (Patient not taking: Reported on 07/27/2016) 30 tablet 3  . Multiple Vitamin (MULTIVITAMIN) tablet Take 1 tablet by mouth daily.    Marland Kitchen. doxycycline (VIBRA-TABS) 100 MG tablet Take 1 tablet (100 mg total) by mouth 2 (two) times daily. 20 tablet 0  . predniSONE (DELTASONE) 20 MG tablet 3 by mouth for 3 days, then 2 by mouth for 2 days, then 1 by mouth for 2 days, then 1/2 by mouth for 2 days. 16 tablet 0   No facility-administered medications prior to visit.     PAST MEDICAL HISTORY: Past Medical History:  Diagnosis Date  . Asthma   . Hyperlipemia   . Hypertension   . Seizures (HCC)     PAST SURGICAL HISTORY: Past Surgical History:  Procedure Laterality Date  . ABDOMINAL HYSTERECTOMY    . CHOLECYSTECTOMY      FAMILY HISTORY: Family History  Problem Relation Age of Onset  . Breast cancer Mother   . Hypertension Mother   . Heart failure Father   . Hypertension Father   . Hyperlipidemia Father     SOCIAL HISTORY: Social History   Social History  . Marital status: Widowed    Spouse name: N/A  . Number of children: 2  . Years of education: 12   Occupational History  . Not on file.   Social History Main Topics  . Smoking status: Former Games developermoker  . Smokeless tobacco: Never Used  . Alcohol use 1.2 oz/week    2 Cans of beer per week     Comment: beer, liquor  . Drug use: No  . Sexual activity: Yes    Birth control/ protection: Surgical   Other Topics Concern  . Not on file   Social History Narrative   Patient is widowed.   Patient has 2 children.   Patient is currently unemployed.   Patient has a high school education.   Patient is right-handed.   Patient  drinks 2 glass of caffeine daily ( soda and tea).     PHYSICAL EXAM  Vitals:   07/27/16 1255  BP: (!) 178/118  Pulse: 72  Height: 5' 7.75" (1.721 m)   There is no height or weight on file to calculate BMI. Generalized: Well developed, in no acute distress  Head: normocephalic and atraumatic. Oropharynx benign  Neck: Supple, no carotid bruits  Cardiac: Regular rate rhythm, no murmur  Musculoskeletal: No deformity   Neurological examination  Mentation: Alert oriented to time, place, history taking. Follows all commands speech and language fluent Cranial nerve II-XII: Fundoscopic exam not done. Pupils were equal round reactive to light extraocular movements were full, visual field were full on confrontational test. Facial sensation and strength were normal. hearing was intact to finger rubbing bilaterally. Uvula tongue  midline. head turning and shoulder shrug and were normal and symmetric.Tongue protrusion into cheek strength was normal. Motor: The motor testing reveals 5 over 5 strength of all 4 extremities. Good symmetric motor tone is noted throughout.  Sensory: Sensory testing is intact to soft touch on all 4 extremities. No evidence of extinction is noted.  Coordination: Cerebellar testing reveals good finger-nose-finger and heel-to-shin bilaterally.  Gait and station: Gait is normal. Tandem gait is normal. Romberg is negative. Reflexes: Deep tendon reflexes are symmetric and normal bilaterally.  DIAGNOSTIC DATA (LABS, IMAGING, TESTING) - ASSESSMENT AND PLAN  50 y.o. year old female  has a past medical history of Asthma; Hyperlipemia; Hypertension; and Seizures (HCC). here to follow-up for seizure disorder. Last seizure event occurred 12/01/2015 after a friend died.   Continue Keppra at current dose will refill for one year Call for any seizure activity Follow-up yearly and when necessary Needs to follow up with primary care for blood pressure maintenance and  monitoring, they may have samples of medication since she claims he cannot afford lisinopril Nilda Riggs, [facility], [facility], APRN  [facility] [address], [address] [place], [place] [address] [phone]

## 2016-07-29 NOTE — Progress Notes (Signed)
I agree with the assessment and plan as directed by NP .The patient is known to me .   Galvin Aversa, MD  

## 2016-08-01 ENCOUNTER — Other Ambulatory Visit: Payer: Self-pay | Admitting: Nurse Practitioner

## 2016-08-01 NOTE — Telephone Encounter (Signed)
Pt request refill for generic keppra. She will run out today.

## 2016-10-06 ENCOUNTER — Ambulatory Visit (INDEPENDENT_AMBULATORY_CARE_PROVIDER_SITE_OTHER): Payer: Self-pay | Admitting: Family Medicine

## 2016-10-06 ENCOUNTER — Encounter: Payer: Self-pay | Admitting: Family Medicine

## 2016-10-06 VITALS — BP 160/120 | HR 75 | Temp 97.8°F | Resp 16 | Ht 67.75 in | Wt 176.4 lb

## 2016-10-06 DIAGNOSIS — M792 Neuralgia and neuritis, unspecified: Secondary | ICD-10-CM | POA: Diagnosis not present

## 2016-10-06 DIAGNOSIS — I10 Essential (primary) hypertension: Secondary | ICD-10-CM

## 2016-10-06 DIAGNOSIS — M5430 Sciatica, unspecified side: Secondary | ICD-10-CM

## 2016-10-06 DIAGNOSIS — Z131 Encounter for screening for diabetes mellitus: Secondary | ICD-10-CM

## 2016-10-06 DIAGNOSIS — Z13220 Encounter for screening for lipoid disorders: Secondary | ICD-10-CM | POA: Diagnosis not present

## 2016-10-06 DIAGNOSIS — J45909 Unspecified asthma, uncomplicated: Secondary | ICD-10-CM

## 2016-10-06 MED ORDER — LISINOPRIL-HYDROCHLOROTHIAZIDE 20-12.5 MG PO TABS: 1.0000 | ORAL_TABLET | Freq: Every day | ORAL | 3 refills | Status: DC

## 2016-10-06 MED ORDER — ALBUTEROL SULFATE HFA 108 (90 BASE) MCG/ACT IN AERS: 2.0000 | INHALATION_SPRAY | Freq: Four times a day (QID) | RESPIRATORY_TRACT | 0 refills | Status: DC | PRN

## 2016-10-06 NOTE — Progress Notes (Signed)
By signing my name below, I, [name], attest that this documentation has been prepared under the direction and in the presence of [name], MD.  Electronically Signed: [name], Medical Scribe. 10/06/16. 12:18 PM.  Subjective:    Patient ID: [name], female    DOB: 02-14-1966, 51 y.o.   MRN: [id number]  HPI Chief Complaint  Patient presents with  . Diabetes    ".\, feet numb" pt  would like to be checked, has been without insurance  . Hyperlipidemia  . Hypertension    HPI Comments: [name] is a 51 y.o. female with a PMHx of epilepsy, GERD, HTN who presents to the [facility] for DM follow-up. Previous PCP was Allean Found, MD. Last seen by me Jan 2017 for asthmatic bronchitis. Pt is fasting. Denies PMHx of thyroid issues.  DM: She's complaining of numbness in her feet. Glucose was 123 at her Jan 2017 visit, 134 March 2015. Reports numbness and burning in bilateral feet. It started with needles/burning sensation for a couple of months, and when it stopped, her feet have been numb since with her toes having the least amount of sensation. Pt hasn't been diagnosed with DM in the past for the past year FHx: Maternal grandmother had DM.  Lab Results  Component Value Date   HGBA1C 5.9 (H) 11/29/2013   No results found for: [name] Readings from Last 3 Encounters:  10/06/16 176 lb 6.4 oz (80 kg)  10/08/15 179 lb 9.6 oz (81.5 kg)  10/04/15 182 lb (82.6 kg)  Body mass index is 27.02 kg/m.  Left leg Pain: Reports intermittent pain infront of her leg located "in her bone" from her left hip that radiates to her knee and occasionally radiates to her back. This has occurred for several months to about a year. Pt states it gets so bad she can't touch her leg. Pt has not tried medication for her sxs. Denies weakness, gait problem, or numbness in her groin area  HLD: Lab Results  Component Value Date   CHOL 212 (H)  11/29/2013   HDL 34 (L) 11/29/2013   LDLCALC 117 (H) 11/29/2013   TRIG 303 (H) 11/29/2013   CHOLHDL 6.2 11/29/2013   Lab Results  Component Value Date   ALT 15 12/09/2013   AST 13 12/09/2013   ALKPHOS 80 12/09/2013   BILITOT 0.4 12/09/2013   HTN: Pt has been out of HCTZ due to financial concerns and lack of insurance.  BP Readings from Last 3 Encounters:  10/06/16 (!) 160/120  07/27/16 (!) 178/118  10/08/15 138/88   Lab Results  Component Value Date   CREATININE 0.68 10/04/2015   Pulmonary: Pt no longer has a albuterol rescue inhaler. Pt removed a puppy from her home and her breathing has got better since - assumes she has pet allergy. Pt was sick a month ago and she's been getting better. Pt quit smoking "a while ago".  Patient Active Problem List   Diagnosis Date Noted  . 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 Asthma   . Hyperlipemia   . Hypertension   . Seizures (HCC)    Past Surgical History:  Procedure Laterality Date  . ABDOMINAL HYSTERECTOMY    . CHOLECYSTECTOMY     Allergies  Allergen Reactions  . Contrast Media [Iodinated Diagnostic Agents] Anaphylaxis    Contrast Dye   .  Codeine   . Iohexol      Desc: UNABLE TO BREATH   . Other Other (See Comments)    Cashews and pistachios; reaction unknown   Prior to Admission medications   Medication Sig Start Date End Date Taking? Authorizing Provider  esomeprazole (NEXIUM) 20 MG packet Take 20 mg by mouth daily before breakfast.   Yes Historical Provider, MD  Fish Oil-Cholecalciferol (FISH OIL + D3) 1000-1000 MG-UNIT CAPS Take by mouth.   Yes Historical Provider, MD  levETIRAcetam (KEPPRA) 500 MG tablet TAKE ONE TABLET BY MOUTH IN THE MORNING AND TWO TABLETS AT BEDTIME 08/01/16  Yes Nilda RiggsNancy Carolyn Martin, NP  Multiple Vitamin (MULTIVITAMIN) tablet Take 1 tablet by mouth daily.   Yes Historical Provider, MD  albuterol  (PROVENTIL HFA;VENTOLIN HFA) 108 (90 Base) MCG/ACT inhaler Inhale 1-2 puffs into the lungs every 4 (four) hours as needed for wheezing or shortness of breath. Patient not taking: Reported on 07/27/2016 10/04/15   Shade FloodJeffrey R Elzena Muston, MD  albuterol (PROVENTIL) (2.5 MG/3ML) 0.083% nebulizer solution Take 3 mLs (2.5 mg total) by nebulization every 6 (six) hours as needed for wheezing or shortness of breath. Patient not taking: Reported on 07/27/2016 10/08/15   Shade FloodJeffrey R Berwyn Bigley, MD  beclomethasone (QVAR) 80 MCG/ACT inhaler Inhale 1 puff into the lungs 2 (two) times daily. Patient not taking: Reported on 07/27/2016 10/04/15   Shade FloodJeffrey R Abdiaziz Klahn, MD  HYDROcodone-acetaminophen (NORCO/VICODIN) 5-325 MG tablet Take 1 tablet by mouth every 6 (six) hours as needed for moderate pain. Patient not taking: Reported on 07/27/2016 10/04/15   Shade FloodJeffrey R Pieter Fooks, MD  lisinopril-hydrochlorothiazide (ZESTORETIC) 20-12.5 MG tablet Take 1 tablet by mouth daily. Patient not taking: Reported on 07/27/2016 10/04/15   Shade FloodJeffrey R Lenzi Marmo, MD   Social History   Social History  . Marital status: Widowed    Spouse name: N/A  . Number of children: 2  . Years of education: 12   Occupational History  . Not on file.   Social History Main Topics  . Smoking status: Former Games developermoker  . Smokeless tobacco: Never Used  . Alcohol use 1.2 oz/week    2 Cans of beer per week     Comment: beer, liquor  . Drug use: No  . Sexual activity: Yes    Birth control/ protection: Surgical   Other Topics Concern  . Not on file   Social History Narrative   Patient is widowed.   Patient has 2 children.   Patient is currently unemployed.   Patient has a high school education.   Patient is right-handed.   Patient drinks 2 glass of caffeine daily ( soda and tea).   Review of Systems  Musculoskeletal: Positive for arthralgias, back pain and myalgias. Negative for gait problem.  Allergic/Immunologic: Positive for environmental allergies.    Neurological: Positive for numbness. Negative for weakness.   Objective:  Physical Exam  Constitutional: She is oriented to person, place, and time. She appears well-developed and well-nourished.  HENT:  Head: Normocephalic and atraumatic.  Eyes: Conjunctivae and EOM are normal. Pupils are equal, round, and reactive to light.  Neck: Carotid bruit is not present.  Cardiovascular: Normal rate, regular rhythm, normal heart sounds and intact distal pulses.  Exam reveals no friction rub.   No murmur heard. Pulses:      Dorsalis pedis pulses are 2+ on the right side, and 2+ on the left side.  Cap refill <1 sec  Pulmonary/Chest: Effort normal and breath sounds normal. No respiratory distress. She has  no wheezes. She has no rales.  Abdominal: Soft. She exhibits no pulsatile midline mass. There is no tenderness.  Musculoskeletal:  No focal bony tenderness at left knee Negative seated straight leg raise Hip internal and external rotation without pain  Neurological: She is alert and oriented to person, place, and time.  Skin: Skin is warm and dry.  Toes are warm Dec sensation with monofilament testing at toes bilaterally  Psychiatric: She has a normal mood and affect. Her behavior is normal.  Vitals reviewed.  BP (!) 160/120   Pulse 75   Temp 97.8 F (36.6 C) (Oral)   Resp 16   Ht 5' 7.75" (1.721 m)   Wt 176 lb 6.4 oz (80 kg)   SpO2 97%   BMI 27.02 kg/m   Assessment & Plan:    ANNIECE BLEILER is a 51 y.o. female Peripheral neuropathic pain - Plan: Hemoglobin A1C, TSH, Vitamin B12, Care order/instruction:  - Check B12, TSH, A1c. Co sider neuro eval or trial of neurontin depending on results.   Sciatic leg pain  - tylenol otc safest with elevated BP for now, rtc in next few weeks to discuss further with possible x-rays.  Essential hypertension - Plan: Comprehensive metabolic panel, lisinopril-hydrochlorothiazide (ZESTORETIC) 20-12.5 MG tablet  - Uncontrolled off medications.  Asymptomatic. Restart lisinopril HCT at previous dose and recheck within the next few weeks. ER/911 precautions given if any symptoms  Screening for hyperlipidemia - Plan: Lipid panel  Screening for diabetes mellitus - Plan: Hemoglobin A1C  Reactive airway disease without complication, unspecified asthma severity, unspecified whether persistent  -Seems improved with decreased exposure to animal dander. Albuterol as needed, then if frequent use, consider restart Qvar.  Meds ordered this encounter  Medications  . lisinopril-hydrochlorothiazide (ZESTORETIC) 20-12.5 MG tablet    Sig: Take 1 tablet by mouth daily.    Dispense:  30 tablet    Refill:  3  . albuterol (PROVENTIL HFA;VENTOLIN HFA) 108 (90 Base) MCG/ACT inhaler    Sig: Inhale 2 puffs into the lungs every 6 (six) hours as needed for wheezing or shortness of breath.    Dispense:  1 Inhaler    Refill:  0   Patient Instructions   Restart previous dose of blood pressure medication, keep a record of blood pressure readings outside of the office and bring those with you to a follow-up visit within the next 2 weeks.  I will check your blood sugar, vitamin B12 level, and thyroid tests to look into cause of numbness and burning in the feet. We can discuss those results at next visit and whether or not evaluation with your neurologist may be needed.  Your leg pain may be due to sciatica, but I would like you to return in the next few weeks for xrays and to discuss pain further. Tylenol as needed, heat or ice to affected area ok for now.   Albuterol if needed for wheezing, but if you are requiring that more than twice per week or any nighttime symptoms, we may need to restart the Qvar.  Return to the clinic or go to the nearest emergency room if any of your symptoms worsen or new symptoms occur.   Peripheral Neuropathy Peripheral neuropathy is a type of nerve damage. It affects nerves that carry signals between the spinal cord and other  parts of the body. These are called peripheral nerves. With peripheral neuropathy, one nerve or a group of nerves may be damaged. What are the causes? Many things can  damage peripheral nerves. For some people with peripheral neuropathy, the cause is unknown. Some causes include:  Diabetes. This is the most common cause of peripheral neuropathy.  Injury to a nerve.  Pressure or stress on a nerve that lasts a long time.  Too little vitamin B. Alcoholism can lead to this.  Infections.  Autoimmune diseases, such as multiple sclerosis and systemic lupus erythematosus.  Inherited nerve diseases.  Some medicines, such as cancer drugs.  Toxic substances, such as lead and mercury.  Too little blood flowing to the legs.  Kidney disease.  Thyroid disease. What are the signs or symptoms? Different people have different symptoms. The symptoms you have will depend on which of your nerves is damaged. Common symptoms include:  Loss of feeling (numbness) in the feet and hands.  Tingling in the feet and hands.  Pain that burns.  Very sensitive skin.  Weakness.  Not being able to move a part of the body (paralysis).  Muscle twitching.  Clumsiness or poor coordination.  Loss of balance.  Not being able to control your bladder.  Feeling dizzy.  Sexual problems. How is this diagnosed? Peripheral neuropathy is a symptom, not a disease. Finding the cause of peripheral neuropathy can be hard. To figure that out, your health care provider will take a medical history and do a physical exam. A neurological exam will also be done. This involves checking things affected by your brain, spinal cord, and nerves (nervous system). For example, your health care provider will check your reflexes, how you move, and what you can feel. Other types of tests may also be ordered, such as:  Blood tests.  A test of the fluid in your spinal cord.  Imaging tests, such as CT scans or an  MRI.  Electromyography (EMG). This test checks the nerves that control muscles.  Nerve conduction velocity tests. These tests check how fast messages pass through your nerves.  Nerve biopsy. A small piece of nerve is removed. It is then checked under a microscope. How is this treated?  Medicine is often used to treat peripheral neuropathy. Medicines may include:  Pain-relieving medicines. Prescription or over-the-counter medicine may be suggested.  Antiseizure medicine. This may be used for pain.  Antidepressants. These also may help ease pain from neuropathy.  Lidocaine. This is a numbing medicine. You might wear a patch or be given a shot.  Mexiletine. This medicine is typically used to help control irregular heart rhythms.  Surgery. Surgery may be needed to relieve pressure on a nerve or to destroy a nerve that is causing pain.  Physical therapy to help movement.  Assistive devices to help movement. Follow these instructions at home:  Only take over-the-counter or prescription medicines as directed by your health care provider. Follow the instructions carefully for any given medicines. Do not take any other medicines without first getting approval from your health care provider.  If you have diabetes, work closely with your health care provider to keep your blood sugar under control.  If you have numbness in your feet:  Check every day for signs of injury or infection. Watch for redness, warmth, and swelling.  Wear padded socks and comfortable shoes. These help protect your feet.  Do not do things that put pressure on your damaged nerve.  Do not smoke. Smoking keeps blood from getting to damaged nerves.  Avoid or limit alcohol. Too much alcohol can cause a lack of B vitamins. These vitamins are needed for healthy nerves.  Develop  a good support system. Coping with peripheral neuropathy can be stressful. Talk to a mental health specialist or join a support group if you  are struggling.  Follow up with your health care provider as directed. Contact a health care provider if:  You have new signs or symptoms of peripheral neuropathy.  You are struggling emotionally from dealing with peripheral neuropathy.  You have a fever. Get help right away if:  You have an injury or infection that is not healing.  You feel very dizzy or begin vomiting.  You have chest pain.  You have trouble breathing. This information is not intended to replace advice given to you by your health care provider. Make sure you discuss any questions you have with your health care provider. Document Released: 08/19/2002 Document Revised: 02/04/2016 Document Reviewed: 05/06/2013 Elsevier Interactive Patient Education  2017 [employer] Inc.  Hypertension Hypertension, commonly called high blood pressure, is when the force of blood pumping through your arteries is too strong. Your arteries are the blood vessels that carry blood from your heart throughout your body. A blood pressure reading consists of a higher number over a lower number, such as 110/72. The higher number (systolic) is the pressure inside your arteries when your heart pumps. The lower number (diastolic) is the pressure inside your arteries when your heart relaxes. Ideally you want your blood pressure below 120/80. Hypertension forces your heart to work harder to pump blood. Your arteries may become narrow or stiff. Having untreated or uncontrolled hypertension can cause heart attack, stroke, kidney disease, and other problems. What increases the risk? Some risk factors for high blood pressure are controllable. Others are not. Risk factors you cannot control include:  Race. You may be at higher risk if you are African American.  Age. Risk increases with age.  Gender. Men are at higher risk than women before age 43 years. After age 50, women are at higher risk than men. Risk factors you can control include:  Not getting  enough exercise or physical activity.  Being overweight.  Getting too much fat, sugar, calories, or salt in your diet.  Drinking too much alcohol. What are the signs or symptoms? Hypertension does not usually cause signs or symptoms. Extremely high blood pressure (hypertensive crisis) may cause headache, anxiety, shortness of breath, and nosebleed. How is this diagnosed? To check if you have hypertension, your health care provider will measure your blood pressure while you are seated, with your arm held at the level of your heart. It should be measured at least twice using the same arm. Certain conditions can cause a difference in blood pressure between your right and left arms. A blood pressure reading that is higher than normal on one occasion does not mean that you need treatment. If it is not clear whether you have high blood pressure, you may be asked to return on a different day to have your blood pressure checked again. Or, you may be asked to monitor your blood pressure at home for 1 or more weeks. How is this treated? Treating high blood pressure includes making lifestyle changes and possibly taking medicine. Living a healthy lifestyle can help lower high blood pressure. You may need to change some of your habits. Lifestyle changes may include:  Following the DASH diet. This diet is high in fruits, vegetables, and whole grains. It is low in salt, red meat, and added sugars.  Keep your sodium intake below 2,300 mg per day.  Getting at least 30-45 minutes of  aerobic exercise at least 4 times per week.  Losing weight if necessary.  Not smoking.  Limiting alcoholic beverages.  Learning ways to reduce stress. Your health care provider may prescribe medicine if lifestyle changes are not enough to get your blood pressure under control, and if one of the following is true:  You are 26-28 years of age and your systolic blood pressure is above 140.  You are 56 years of age or older,  and your systolic blood pressure is above 150.  Your diastolic blood pressure is above 90.  You have diabetes, and your systolic blood pressure is over 140 or your diastolic blood pressure is over 90.  You have kidney disease and your blood pressure is above 140/90.  You have heart disease and your blood pressure is above 140/90. Your personal target blood pressure may vary depending on your medical conditions, your age, and other factors. Follow these instructions at home:  Have your blood pressure rechecked as directed by your health care provider.  Take medicines only as directed by your health care provider. Follow the directions carefully. Blood pressure medicines must be taken as prescribed. The medicine does not work as well when you skip doses. Skipping doses also puts you at risk for problems.  Do not smoke.  Monitor your blood pressure at home as directed by your health care provider. Contact a health care provider if:  You think you are having a reaction to medicines taken.  You have recurrent headaches or feel dizzy.  You have swelling in your ankles.  You have trouble with your vision. Get help right away if:  You develop a severe headache or confusion.  You have unusual weakness, numbness, or feel faint.  You have severe chest or abdominal pain.  You vomit repeatedly.  You have trouble breathing. This information is not intended to replace advice given to you by your health care provider. Make sure you discuss any questions you have with your health care provider. Document Released: 08/29/2005 Document Revised: 02/04/2016 Document Reviewed: 06/21/2013 Elsevier Interactive Patient Education  2017 [employer].   IF you received an x-ray today, you will receive an invoice from [facility]. Please contact [facility] at [phone] with questions or concerns regarding your invoice.   IF you received labwork today, you will receive an  invoice from [facility]. Please contact [facility] at [phone] with questions or concerns regarding your invoice.   Our billing staff will not be able to assist you with questions regarding bills from these companies.  You will be contacted with the lab results as soon as they are available. The fastest way to get your results is to activate your My Chart account. Instructions are located on the last page of this paperwork. If you have not heard from [place] regarding the results in 2 weeks, please contact this office.      I personally performed the services described in this documentation, which was scribed in my presence. The recorded information has been reviewed and considered, and addended by me as needed.   Signed,   [name], MD [facility] at [facility].  10/09/16 1:17 PM

## 2016-10-06 NOTE — Patient Instructions (Addendum)
Restart previous dose of blood pressure medication, keep a record of blood pressure readings outside of the office and bring those with you to a follow-up visit within the next 2 weeks.  I will check your blood sugar, vitamin B12 level, and thyroid tests to look into cause of numbness and burning in the feet. We can discuss those results at next visit and whether or not evaluation with your neurologist may be needed.  Your leg pain may be due to sciatica, but I would like you to return in the next few weeks for xrays and to discuss pain further. Tylenol as needed, heat or ice to affected area ok for now.   Albuterol if needed for wheezing, but if you are requiring that more than twice per week or any nighttime symptoms, we may need to restart the Qvar.  Return to the clinic or go to the nearest emergency room if any of your symptoms worsen or new symptoms occur.   Peripheral Neuropathy Peripheral neuropathy is a type of nerve damage. It affects nerves that carry signals between the spinal cord and other parts of the body. These are called peripheral nerves. With peripheral neuropathy, one nerve or a group of nerves may be damaged. What are the causes? Many things can damage peripheral nerves. For some people with peripheral neuropathy, the cause is unknown. Some causes include:  Diabetes. This is the most common cause of peripheral neuropathy.  Injury to a nerve.  Pressure or stress on a nerve that lasts a long time.  Too little vitamin B. Alcoholism can lead to this.  Infections.  Autoimmune diseases, such as multiple sclerosis and systemic lupus erythematosus.  Inherited nerve diseases.  Some medicines, such as cancer drugs.  Toxic substances, such as lead and mercury.  Too little blood flowing to the legs.  Kidney disease.  Thyroid disease. What are the signs or symptoms? Different people have different symptoms. The symptoms you have will depend on which of your nerves is  damaged. Common symptoms include:  Loss of feeling (numbness) in the feet and hands.  Tingling in the feet and hands.  Pain that burns.  Very sensitive skin.  Weakness.  Not being able to move a part of the body (paralysis).  Muscle twitching.  Clumsiness or poor coordination.  Loss of balance.  Not being able to control your bladder.  Feeling dizzy.  Sexual problems. How is this diagnosed? Peripheral neuropathy is a symptom, not a disease. Finding the cause of peripheral neuropathy can be hard. To figure that out, your health care provider will take a medical history and do a physical exam. A neurological exam will also be done. This involves checking things affected by your brain, spinal cord, and nerves (nervous system). For example, your health care provider will check your reflexes, how you move, and what you can feel. Other types of tests may also be ordered, such as:  Blood tests.  A test of the fluid in your spinal cord.  Imaging tests, such as CT scans or an MRI.  Electromyography (EMG). This test checks the nerves that control muscles.  Nerve conduction velocity tests. These tests check how fast messages pass through your nerves.  Nerve biopsy. A small piece of nerve is removed. It is then checked under a microscope. How is this treated?  Medicine is often used to treat peripheral neuropathy. Medicines may include:  Pain-relieving medicines. Prescription or over-the-counter medicine may be suggested.  Antiseizure medicine. This may be used for  pain.  Antidepressants. These also may help ease pain from neuropathy.  Lidocaine. This is a numbing medicine. You might wear a patch or be given a shot.  Mexiletine. This medicine is typically used to help control irregular heart rhythms.  Surgery. Surgery may be needed to relieve pressure on a nerve or to destroy a nerve that is causing pain.  Physical therapy to help movement.  Assistive devices to help  movement. Follow these instructions at home:  Only take over-the-counter or prescription medicines as directed by your health care provider. Follow the instructions carefully for any given medicines. Do not take any other medicines without first getting approval from your health care provider.  If you have diabetes, work closely with your health care provider to keep your blood sugar under control.  If you have numbness in your feet:  Check every day for signs of injury or infection. Watch for redness, warmth, and swelling.  Wear padded socks and comfortable shoes. These help protect your feet.  Do not do things that put pressure on your damaged nerve.  Do not smoke. Smoking keeps blood from getting to damaged nerves.  Avoid or limit alcohol. Too much alcohol can cause a lack of B vitamins. These vitamins are needed for healthy nerves.  Develop a good support system. Coping with peripheral neuropathy can be stressful. Talk to a mental health specialist or join a support group if you are struggling.  Follow up with your health care provider as directed. Contact a health care provider if:  You have new signs or symptoms of peripheral neuropathy.  You are struggling emotionally from dealing with peripheral neuropathy.  You have a fever. Get help right away if:  You have an injury or infection that is not healing.  You feel very dizzy or begin vomiting.  You have chest pain.  You have trouble breathing. This information is not intended to replace advice given to you by your health care provider. Make sure you discuss any questions you have with your health care provider. Document Released: 08/19/2002 Document Revised: 02/04/2016 Document Reviewed: 05/06/2013 Elsevier Interactive Patient Education  2017 [employer]  Hypertension Hypertension, commonly called high blood pressure, is when the force of blood pumping through your arteries is too strong. Your arteries are the  blood vessels that carry blood from your heart throughout your body. A blood pressure reading consists of a higher number over a lower number, such as 110/72. The higher number (systolic) is the pressure inside your arteries when your heart pumps. The lower number (diastolic) is the pressure inside your arteries when your heart relaxes. Ideally you want your blood pressure below 120/80. Hypertension forces your heart to work harder to pump blood. Your arteries may become narrow or stiff. Having untreated or uncontrolled hypertension can cause heart attack, stroke, kidney disease, and other problems. What increases the risk? Some risk factors for high blood pressure are controllable. Others are not. Risk factors you cannot control include:  Race. You may be at higher risk if you are African American.  Age. Risk increases with age.  Gender. Men are at higher risk than women before age 75 years. After age 77, women are at higher risk than men. Risk factors you can control include:  Not getting enough exercise or physical activity.  Being overweight.  Getting too much fat, sugar, calories, or salt in your diet.  Drinking too much alcohol. What are the signs or symptoms? Hypertension does not usually cause signs or symptoms.  Extremely high blood pressure (hypertensive crisis) may cause headache, anxiety, shortness of breath, and nosebleed. How is this diagnosed? To check if you have hypertension, your health care provider will measure your blood pressure while you are seated, with your arm held at the level of your heart. It should be measured at least twice using the same arm. Certain conditions can cause a difference in blood pressure between your right and left arms. A blood pressure reading that is higher than normal on one occasion does not mean that you need treatment. If it is not clear whether you have high blood pressure, you may be asked to return on a different day to have your blood  pressure checked again. Or, you may be asked to monitor your blood pressure at home for 1 or more weeks. How is this treated? Treating high blood pressure includes making lifestyle changes and possibly taking medicine. Living a healthy lifestyle can help lower high blood pressure. You may need to change some of your habits. Lifestyle changes may include:  Following the DASH diet. This diet is high in fruits, vegetables, and whole grains. It is low in salt, red meat, and added sugars.  Keep your sodium intake below 2,300 mg per day.  Getting at least 30-45 minutes of aerobic exercise at least 4 times per week.  Losing weight if necessary.  Not smoking.  Limiting alcoholic beverages.  Learning ways to reduce stress. Your health care provider may prescribe medicine if lifestyle changes are not enough to get your blood pressure under control, and if one of the following is true:  You are 8-45 years of age and your systolic blood pressure is above 140.  You are 95 years of age or older, and your systolic blood pressure is above 150.  Your diastolic blood pressure is above 90.  You have diabetes, and your systolic blood pressure is over 140 or your diastolic blood pressure is over 90.  You have kidney disease and your blood pressure is above 140/90.  You have heart disease and your blood pressure is above 140/90. Your personal target blood pressure may vary depending on your medical conditions, your age, and other factors. Follow these instructions at home:  Have your blood pressure rechecked as directed by your health care provider.  Take medicines only as directed by your health care provider. Follow the directions carefully. Blood pressure medicines must be taken as prescribed. The medicine does not work as well when you skip doses. Skipping doses also puts you at risk for problems.  Do not smoke.  Monitor your blood pressure at home as directed by your health care  provider. Contact a health care provider if:  You think you are having a reaction to medicines taken.  You have recurrent headaches or feel dizzy.  You have swelling in your ankles.  You have trouble with your vision. Get help right away if:  You develop a severe headache or confusion.  You have unusual weakness, numbness, or feel faint.  You have severe chest or abdominal pain.  You vomit repeatedly.  You have trouble breathing. This information is not intended to replace advice given to you by your health care provider. Make sure you discuss any questions you have with your health care provider. Document Released: 08/29/2005 Document Revised: 02/04/2016 Document Reviewed: 06/21/2013 Elsevier Interactive Patient Education  2017 [employer].   IF you received an x-ray today, you will receive an invoice from [facility]. Please contact [facility] at  [phone] with questions or concerns regarding your invoice.   IF you received labwork today, you will receive an invoice from [facility]. Please contact [facility] at [phone] with questions or concerns regarding your invoice.   Our billing staff will not be able to assist you with questions regarding bills from these companies.  You will be contacted with the lab results as soon as they are available. The fastest way to get your results is to activate your My Chart account. Instructions are located on the last page of this paperwork. If you have not heard from [place] regarding the results in 2 weeks, please contact this office.

## 2016-10-07 LAB — COMPREHENSIVE METABOLIC PANEL
A/G RATIO: 1.7 (ref 1.2–2.2)
ALK PHOS: 108 IU/L (ref 39–117)
ALT: 40 IU/L — ABNORMAL HIGH (ref 0–32)
AST: 27 IU/L (ref 0–40)
Albumin: 4.3 g/dL (ref 3.5–5.5)
BUN/Creatinine Ratio: 12 (ref 9–23)
BUN: 8 mg/dL (ref 6–24)
Bilirubin Total: 0.4 mg/dL (ref 0.0–1.2)
CO2: 24 mmol/L (ref 18–29)
CREATININE: 0.65 mg/dL (ref 0.57–1.00)
Calcium: 9.5 mg/dL (ref 8.7–10.2)
Chloride: 98 mmol/L (ref 96–106)
GFR calc Af Amer: 120 mL/min/{1.73_m2} (ref >59)
GFR calc non Af Amer: 104 mL/min/{1.73_m2} (ref >59)
GLOBULIN, TOTAL: 2.5 g/dL (ref 1.5–4.5)
Glucose: 136 mg/dL — ABNORMAL HIGH (ref 65–99)
Potassium: 4 mmol/L (ref 3.5–5.2)
SODIUM: 139 mmol/L (ref 134–144)
Total Protein: 6.8 g/dL (ref 6.0–8.5)

## 2016-10-07 LAB — VITAMIN B12: Vitamin B-12: 1092 pg/mL (ref 232–1245)

## 2016-10-07 LAB — LIPID PANEL
CHOL/HDL RATIO: 7.6 ratio — AB (ref 0.0–4.4)
CHOLESTEROL TOTAL: 229 mg/dL — AB (ref 100–199)
HDL: 30 mg/dL — ABNORMAL LOW (ref >39)
TRIGLYCERIDES: 437 mg/dL — AB (ref 0–149)

## 2016-10-07 LAB — HEMOGLOBIN A1C
Est. average glucose Bld gHb Est-mCnc: 140 mg/dL
HEMOGLOBIN A1C: 6.5 % — AB (ref 4.8–5.6)

## 2016-10-07 LAB — TSH: TSH: 1.49 u[IU]/mL (ref 0.450–4.500)

## 2016-10-19 ENCOUNTER — Ambulatory Visit (INDEPENDENT_AMBULATORY_CARE_PROVIDER_SITE_OTHER): Payer: BLUE CROSS/BLUE SHIELD | Admitting: Family Medicine

## 2016-10-19 VITALS — BP 130/82 | HR 97 | Temp 98.7°F | Resp 18 | Ht 67.0 in | Wt 170.6 lb

## 2016-10-19 DIAGNOSIS — J329 Chronic sinusitis, unspecified: Secondary | ICD-10-CM

## 2016-10-19 DIAGNOSIS — J101 Influenza due to other identified influenza virus with other respiratory manifestations: Secondary | ICD-10-CM

## 2016-10-19 LAB — POCT INFLUENZA A/B
INFLUENZA A, POC: POSITIVE — AB
INFLUENZA B, POC: NEGATIVE

## 2016-10-19 MED ORDER — AMOXICILLIN-POT CLAVULANATE 875-125 MG PO TABS: 1.0000 | ORAL_TABLET | Freq: Two times a day (BID) | ORAL | 0 refills | Status: DC

## 2016-10-19 MED ORDER — PSEUDOEPH-BROMPHEN-DM 30-2-10 MG/5ML PO SYRP: 5.0000 mL | ORAL_SOLUTION | Freq: Four times a day (QID) | ORAL | 0 refills | Status: DC | PRN

## 2016-10-19 MED ORDER — OSELTAMIVIR PHOSPHATE 75 MG PO CAPS: 75.0000 mg | ORAL_CAPSULE | Freq: Two times a day (BID) | ORAL | 0 refills | Status: DC

## 2016-10-19 NOTE — Progress Notes (Signed)
Patient ID: [name], female    DOB: [birth year], 51 y.o.   MRN: [id number]  PCP: Allean Found, MD  Chief Complaint  Patient presents with  . Cough  . Headache    Subjective:  HPI 51 year old female presents for evaluation of cough and headache x 2 days. Today she reports weakness, severe generalized headache.  Has had nasal congestion intermittently for over 1 month.  Last evening developed chest is tightness with wheezing . Cough dry, hacking and nonproductive. Over the last couple days has used Albuterol at least twice per day. Reports fever early morning today and has had body aches over the last few days.   Social History   Social History  . Marital status: Widowed    Spouse name: N/A  . Number of children: 2  . Years of education: 12   Occupational History  . Not on file.   Social History Main Topics  . Smoking status: Former Games developer  . Smokeless tobacco: Never Used  . Alcohol use 1.2 oz/week    2 Cans of beer per week     Comment: beer, liquor  . Drug use: No  . Sexual activity: Yes    Birth control/ protection: Surgical   Other Topics Concern  . Not on file   Social History Narrative   Patient is widowed.   Patient has 2 children.   Patient is currently unemployed.   Patient has a high school education.   Patient is right-handed.   Patient drinks 2 glass of caffeine daily ( soda and tea).    Family History  Problem Relation Age of Onset  . Breast cancer Mother   . Hypertension Mother   . Heart failure Father   . Hypertension Father   . Hyperlipidemia Father      Review of Systems  Patient Active Problem List   Diagnosis Date Noted  . 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 Contrast Media [Iodinated Diagnostic Agents] Anaphylaxis    Contrast Dye   . Codeine   . Iohexol      Desc: UNABLE TO BREATH   . Other Other (See Comments)   Cashews and pistachios; reaction unknown    Prior to Admission medications   Medication Sig Start Date End Date Taking? Authorizing Provider  esomeprazole (NEXIUM) 20 MG packet Take 20 mg by mouth daily before breakfast.   Yes Historical Provider, MD  Fish Oil-Cholecalciferol (FISH OIL + D3) 1000-1000 MG-UNIT CAPS Take by mouth.   Yes Historical Provider, MD  levETIRAcetam (KEPPRA) 500 MG tablet TAKE ONE TABLET BY MOUTH IN THE MORNING AND TWO TABLETS AT BEDTIME 08/01/16  Yes Nilda Riggs, NP  lisinopril-hydrochlorothiazide (ZESTORETIC) 20-12.5 MG tablet Take 1 tablet by mouth daily. 10/06/16  Yes [name], MD  Multiple Vitamin (MULTIVITAMIN) tablet Take 1 tablet by mouth daily.   Yes Historical Provider, MD  albuterol (PROVENTIL HFA;VENTOLIN HFA) 108 (90 Base) MCG/ACT inhaler Inhale 2 puffs into the lungs every 6 (six) hours as needed for wheezing or shortness of breath. Patient not taking: Reported on 10/19/2016 10/06/16   [name], MD  beclomethasone (QVAR) 80 MCG/ACT inhaler Inhale 1 puff into the lungs 2 (two) times daily. Patient not taking: Reported on 07/27/2016 10/04/15   [name], MD    Past Medical, Surgical Family and Social History reviewed and updated.    Objective:  Today's Vitals   10/19/16 1409  BP: 130/82  Pulse: 97  Resp: 18  Temp: 98.7 F (37.1 C)  TempSrc: Oral  SpO2: 97%  Weight: 170 lb 9.6 oz (77.4 kg)  Height: 5\' 7"  (1.702 m)    Wt Readings from Last 3 Encounters:  10/19/16 170 lb 9.6 oz (77.4 kg)  10/06/16 176 lb 6.4 oz (80 kg)  10/08/15 179 lb 9.6 oz (81.5 kg)   Physical Exam  Constitutional: She is oriented to person, place, and time. Vital signs are normal. She appears well-developed and well-nourished. She is active. She has a sickly appearance. She appears ill.  HENT:  Head: Normocephalic.  Right Ear: Hearing, tympanic membrane, external ear and ear canal normal.  Left Ear: Hearing, tympanic membrane, external ear  and ear canal normal.  Nose: Mucosal edema and rhinorrhea present.  Mouth/Throat: Uvula is midline, oropharynx is clear and moist and mucous membranes are normal.  Cardiovascular: Normal rate, regular rhythm, normal heart sounds and intact distal pulses.   Musculoskeletal: Normal range of motion.  Neurological: She is alert and oriented to person, place, and time.  Skin: Skin is warm and dry.  Psychiatric: She has a normal mood and affect. Her behavior is normal. Judgment and thought content normal.   Results for orders placed or performed in visit on 10/19/16  POCT Influenza A/B  Result Value Ref Range   Influenza A, POC Positive (A) Negative   Influenza B, POC Negative Negative      Assessment & Plan:  1. Influenza A  2. Sinusitis, unspecified chronicity, unspecified location  Plan:  Start Augmentin 1 tablet twice daily with food to avoid stomach upset x 10 days.  Start Tamilflu 75 mg twice daily x 5 day.  Brompheniramine-Pseudoephedrine-DM 30-2-10 MG/5ML -Take 5 ml up to 4 times daily for cough.  Return for care if symptoms worsen or do not improve.   Godfrey PickKimberly S. Tiburcio PeaHarris, MSN, FNP-C [facility] at [facility] [phone]

## 2016-10-19 NOTE — Patient Instructions (Addendum)
Start Augmentin 1 tablet twice daily with food to avoid stomach upset x 10 days. Complete all medication.  Start Tamilflu 75 mg twice daily x 5 day.  Brompheniramine-Pseudoephedrine-DM 30-2-10 MG/5ML -Take 5 ml up to 4 times daily for cough.  IF you received an x-ray today, you will [facility] an [facility]. Please contact [facility] at [phone] with questions or concerns regarding your invoice.   IF you received labwork today, you will receive an invoice from [facility]. Please contact [facility] at [phone] with questions or concerns regarding your invoice.   Our billing staff will not be able to assist you with questions regarding bills from these companies.  You will be contacted with the lab results as soon as they are available. The fastest way to get your results is to activate your My Chart account. Instructions are located on the last page of this paperwork. If you have not heard from us regarding the results in 2 weeks, please contact this office.     Influenza, Adult Influenza ("the flu") is an infection in the lungs, nose, and throat (respiratory tract). It is caused by a virus. The flu causes many common cold symptoms, as well as a high fever and body aches. It can make you feel very sick. The flu spreads easily from person to person (is contagious). Getting a flu shot (influenza vaccination) every year is the best way to prevent the flu. Follow these instructions at home:  Take over-the-counter and prescription medicines only as told by your doctor.  Use a cool mist humidifier to add moisture (humidity) to the air in your home. This can make it easier to breathe.  Rest as needed.  Drink enough fluid to keep your pee (urine) clear or pale yellow.  Cover your mouth and nose when you cough or sneeze.  Wash your hands with soap and water often, especially after you cough or sneeze. If you cannot use soap and water, use hand  sanitizer.  Stay home from work or school as told by your doctor. Unless you are visiting your doctor, try to avoid leaving home until your fever has been gone for 24 hours without the use of medicine.  Keep all follow-up visits as told by your doctor. This is important. How is this prevented?  Getting a yearly (annual) flu shot is the best way to avoid getting the flu. You may get the flu shot in late summer, fall, or winter. Ask your doctor when you should get your flu shot.  Wash your hands often or use hand sanitizer often.  Avoid contact with people who are sick during cold and flu season.  Eat healthy foods.  Drink plenty of fluids.  Get enough sleep.  Exercise regularly. Contact a doctor if:  You get new symptoms.  You have:  Chest pain.  Watery poop (diarrhea).  A fever.  Your cough gets worse.  You start to have more mucus.  You feel sick to your stomach (nauseous).  You throw up (vomit). Get help right away if:  You start to be short of breath or have trouble breathing.  Your skin or nails turn a bluish color.  You have very bad pain or stiffness in your neck.  You get a sudden headache.  You get sudden pain in your face or ear.  You cannot stop throwing up. This information is not intended to replace advice given to you by your health care provider. Make sure you discuss any questions you  have with your health care provider. Document Released: 06/07/2008 Document Revised: 02/04/2016 Document Reviewed: 06/23/2015 Elsevier Interactive Patient Education  2017 [employer].

## 2016-10-20 ENCOUNTER — Ambulatory Visit: Payer: BLUE CROSS/BLUE SHIELD | Admitting: Family Medicine

## 2016-11-17 ENCOUNTER — Ambulatory Visit (INDEPENDENT_AMBULATORY_CARE_PROVIDER_SITE_OTHER): Payer: BLUE CROSS/BLUE SHIELD | Admitting: Family Medicine

## 2016-11-17 ENCOUNTER — Encounter: Payer: Self-pay | Admitting: Family Medicine

## 2016-11-17 VITALS — BP 146/102 | HR 73 | Temp 98.0°F | Resp 16 | Ht 67.75 in | Wt 170.8 lb

## 2016-11-17 DIAGNOSIS — F329 Major depressive disorder, single episode, unspecified: Secondary | ICD-10-CM

## 2016-11-17 DIAGNOSIS — E785 Hyperlipidemia, unspecified: Secondary | ICD-10-CM

## 2016-11-17 DIAGNOSIS — G6289 Other specified polyneuropathies: Secondary | ICD-10-CM | POA: Diagnosis not present

## 2016-11-17 DIAGNOSIS — F1020 Alcohol dependence, uncomplicated: Secondary | ICD-10-CM | POA: Diagnosis not present

## 2016-11-17 DIAGNOSIS — I10 Essential (primary) hypertension: Secondary | ICD-10-CM | POA: Diagnosis not present

## 2016-11-17 DIAGNOSIS — E119 Type 2 diabetes mellitus without complications: Secondary | ICD-10-CM | POA: Diagnosis not present

## 2016-11-17 DIAGNOSIS — F32A Depression, unspecified: Secondary | ICD-10-CM

## 2016-11-17 MED ORDER — ATORVASTATIN CALCIUM 10 MG PO TABS: 10.0000 mg | ORAL_TABLET | Freq: Every day | ORAL | 1 refills | Status: DC

## 2016-11-17 MED ORDER — LISINOPRIL-HYDROCHLOROTHIAZIDE 20-25 MG PO TABS: 1.0000 | ORAL_TABLET | Freq: Every day | ORAL | 1 refills | Status: DC

## 2016-11-17 MED ORDER — SERTRALINE HCL 50 MG PO TABS: 50.0000 mg | ORAL_TABLET | Freq: Every day | ORAL | 1 refills | Status: DC

## 2016-11-17 NOTE — Progress Notes (Signed)
By signing my name below, I, [name], attest that this documentation has been prepared under the direction and in the presence of Meredith StaggersJeffrey Lyncoln Ledgerwood, MD.  Electronically Signed: Arvilla MarketMesha Guinyard, Medical Scribe. 11/17/16. 10:41 AM.  Subjective:    Patient ID: [name], female    DOB: 11/26/1965, 51 y.o.   MRN: [id number]  HPI Chief Complaint  Patient presents with  . Follow-up    LAB RESULTS  . Depression    POSITIVE ANSWERS IN TRIAGE -per patient wants to be start Zololt again    HPI Comments: [name] is a 51 y.o. female who presents to the [facility] for follow-up. She was last seen Jan 25th for multiple concerns. She was having some peripheral neuropathy sxs and uncontrolled bp at that time- off of medication.. Previous records were reviewed.  Peripheral Neuropathic Pain: She had a nl Vit B12, and nl TSH. She was having some intermittent leg pain that was radiating to her lower back for months to a year, but also having some burning into her feet. Pt still has burning in her legs, L>R, and burning as well as numbing in the tips of her toes and numbing in the balls of her feet. Her neurologist does not know about the numbness and tingling in her legs and feet. Pt's next appt with her neurologist is in Nov 2018.  HTN: Restarted on lisinopril-HCTZ at previous dose. Her bp was 160/120 at that visit. She's 143/102 today. Pt is compliant with lisinopril. Pt drinks liquor daily, drinking up to a pint a night or every other night and reports she's addicted to it. She thinks she can cut back but it helps her sleep. She notes she started drinking after her husband passed 8 years ago and states she's had HTN before she started drinking. Denies DUIs, or family problems due to it. FHx: Grandfather was an alcoholic. Lab Results  Component Value Date   CREATININE 0.65 10/06/2016   BP Readings from Last 3 Encounters:  11/17/16 (!) 146/102  10/19/16 130/82    10/06/16 (!) 160/120   HLD: Pt was on simvastatin in the past but has discontinued. Lab Results  Component Value Date   CHOL 229 (H) 10/06/2016   HDL 30 (L) 10/06/2016   LDLCALC Comment 10/06/2016   TRIG 437 (H) 10/06/2016   CHOLHDL 7.6 (H) 10/06/2016   Lab Results  Component Value Date   ALT 40 (H) 10/06/2016   AST 27 10/06/2016   ALKPHOS 108 10/06/2016   BILITOT 0.4 10/06/2016   Hyperglycemia: Prev A1c with DM was 5.9, 2 years ago. Reports loss of appetite. FHx: A lot of family members have DM. Lab Results  Component Value Date   HGBA1C 6.5 (H) 10/06/2016   Depression: Has previously taken zoloft and would like to restart. Discontinued zoloft 8 years ago due to financial concerns after loosing her insurance. Pt talked to a therapist in the past but stopped going because she got tired of "the stupid questions". Denies thoughts of self harm or SI. FHx: A lot of family members have depression.  Dep[facility] screen [facility] 11/17/2016 10/19/2016 10/06/2016 10/08/2015 10/04/2015  Decreased Interest 1 0 1 0 0  Down, Depressed, Hopeless 1 0 0 0 0  PHQ - 2 Score 2 0 1 0 0  Altered sleeping 3 - - - -  Tired, decreased energy 3 - - - -  Change in appetite 2 - - - -  Feeling bad or failure  about yourself  1 - - - -  Trouble concentrating 1 - - - -  Moving slowly or fidgety/restless 1 - - - -  Suicidal thoughts 0 - - - -  PHQ-9 Score 13 - - - -   Patient Active Problem List   Diagnosis Date Noted  . 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 Asthma   . Hyperlipemia   . Hypertension   . Seizures (HCC)    Past Surgical History:  Procedure Laterality Date  . ABDOMINAL HYSTERECTOMY    . CHOLECYSTECTOMY     Allergies  Allergen Reactions  . Contrast Media [Iodinated Diagnostic Agents] Anaphylaxis    Contrast Dye   . Codeine   . Iohexol      Desc: UNABLE TO BREATH   . Other Other (See  Comments)    Cashews and pistachios; reaction unknown   Prior to Admission medications   Medication Sig Start Date End Date Taking? Authorizing Provider  esomeprazole (NEXIUM) 20 MG packet Take 20 mg by mouth daily before breakfast.   Yes Historical Provider, MD  Fish Oil-Cholecalciferol (FISH OIL + D3) 1000-1000 MG-UNIT CAPS Take by mouth.   Yes Historical Provider, MD  levETIRAcetam (KEPPRA) 500 MG tablet TAKE ONE TABLET BY MOUTH IN THE MORNING AND TWO TABLETS AT BEDTIME 08/01/16  Yes Nilda Riggs, NP  lisinopril-hydrochlorothiazide (ZESTORETIC) 20-12.5 MG tablet Take 1 tablet by mouth daily. 10/06/16  Yes [name], MD  Multiple Vitamin (MULTIVITAMIN) tablet Take 1 tablet by mouth daily.   Yes Historical Provider, MD  albuterol (PROVENTIL HFA;VENTOLIN HFA) 108 (90 Base) MCG/ACT inhaler Inhale 2 puffs into the lungs every 6 (six) hours as needed for wheezing or shortness of breath. Patient not taking: Reported on 10/19/2016 10/06/16   [name], MD  beclomethasone (QVAR) 80 MCG/ACT inhaler Inhale 1 puff into the lungs 2 (two) times daily. Patient not taking: Reported on 07/27/2016 10/04/15   [name], MD   Social History   Social History  . Marital status: Widowed    Spouse name: N/A  . Number of children: 2  . Years of education: 12   Occupational History  . Not on file.   Social History Main Topics  . Smoking status: Former Games developer  . Smokeless tobacco: Never Used  . Alcohol use 1.2 oz/week    2 Cans of beer per week     Comment: beer, liquor  . Drug use: No  . Sexual activity: Yes    Birth control/ protection: Surgical   Other Topics Concern  . Not on file   Social History Narrative   Patient is widowed.   Patient has 2 children.   Patient is currently unemployed.   Patient has a high school education.   Patient is right-handed.   Patient drinks 2 glass of caffeine daily ( soda and tea).   Review of Systems  Constitutional: Positive for  appetite change.  Musculoskeletal: Positive for myalgias.  Neurological: Positive for numbness.  Psychiatric/Behavioral: Positive for dysphoric mood and sleep disturbance. Negative for self-injury and suicidal ideas.   Objective:  Physical Exam  Constitutional: She is oriented to person, place, and time. She appears well-developed and well-nourished. No distress.  HENT:  Head: Normocephalic and atraumatic.  Eyes: Conjunctivae and EOM are normal. Pupils are equal, round, and reactive to light.  Neck: Neck supple. Carotid bruit is not present.  Cardiovascular: Normal rate, regular rhythm, normal heart sounds and intact distal pulses.  Exam reveals no gallop and no friction rub.   No murmur heard. Pulmonary/Chest: Effort normal and breath sounds normal. No respiratory distress. She has no rales.  Abdominal: Soft. She exhibits no pulsatile midline mass. There is no tenderness. There is no guarding.  Musculoskeletal: She exhibits no edema (lower extremity).  Neurological: She is alert and oriented to person, place, and time.  Strength intact lower extremities  Skin: Skin is warm and dry.  Psychiatric: She has a normal mood and affect. Her behavior is normal.  Nursing note and vitals reviewed.   Vitals:   11/17/16 1016 11/17/16 1028  BP: (!) 143/102 (!) 146/102  Pulse: 73   Resp: 16   Temp: 98 F (36.7 C)   TempSrc: Oral   SpO2: 97%   Weight: 170 lb 12.8 oz (77.5 kg)   Height: 5' 7.75" (1.721 m)   Body mass index is 26.16 kg/m. Assessment & Plan:   JEHAN RANGANATHAN is a 51 y.o. female Essential hypertension - Plan: lisinopril-hydrochlorothiazide (PRINZIDE,ZESTORETIC) 20-25 MG tablet  - Uncontrolled, but likely due in part due to alcohol use. Decrease alcohol as discussed below.  - For now we'll increase lisinopril HCT to 20/25 mg daily, recheck in 3-4 weeks.   Other polyneuropathy (HCC)  - Followed by neurology for history of seizure disorder, recommended she give neurology call  for further evaluation, testing, and to discuss medication options with what she is taking.  Type 2 diabetes mellitus without complication, without long-term current use of insulin (HCC)  - Barely at level of diabetes. Decrease alcohol, watch diet, recheck levels in 3 months, to decide on metformin. No new medications at this time.  Uncomplicated alcohol dependence (HCC)  - Alcohol abuse/dependence. Cessation/cutting back discussed and resources provided for her to receive help and counseling as suspect there may be a component of alcohol induced mood disorder with her depression. Advised to let me know if other resources needed.  Depression, unspecified depression type - Plan: sertraline (ZOLOFT) 50 MG tablet  - Alcohol overuse/abuse/dependence as above may be contributor. Restart Zoloft 50 mg daily, RTC/ER precautions discussed, and recheck in 3-4 weeks.  Hyperlipidemia, unspecified hyperlipidemia type - Plan: atorvastatin (LIPITOR) 10 MG tablet  - Based on last labs and diabetes will start Lipitor 10 mg daily, possibly will need higher intensity dosing but we'll start at low dose for tolerability initially.  Recheck in 3-4 weeks, sooner if worsening  Meds ordered this encounter  Medications  . atorvastatin (LIPITOR) 10 MG tablet    Sig: Take 1 tablet (10 mg total) by mouth daily at 6 PM.    Dispense:  30 tablet    Refill:  1  . sertraline (ZOLOFT) 50 MG tablet    Sig: Take 1 tablet (50 mg total) by mouth daily.    Dispense:  30 tablet    Refill:  1  . lisinopril-hydrochlorothiazide (PRINZIDE,ZESTORETIC) 20-25 MG tablet    Sig: Take 1 tablet by mouth daily.    Dispense:  90 tablet    Refill:  1   Patient Instructions    For diabetes, watching diet and decreasing alcohol may help with numbers. Plan to repeat test in 3 months.   For blood pressure, will change to 20/25mg  dose of lisinopril/hctz, but cutting back on alcohol should help.   For alcohol, I would like you to cut back  on alcohol. If you are unable to cut back on  your own, please call one of the resources below for help:  [facility]: [phone] [facility]: [phone]  For depression, restart Zoloft 50mg  once per day. If any thoughts of suicide - call 911 or go to emergency room.  Follow up in 3-4 weeks to talk about that further. Return to the clinic or go to the nearest emergency room if any of your symptoms worsen or new symptoms occur.  Call your neurologist for appointment to discuss the burning in your feet and to discuss medication options with taking Keppra.  We can also look at that symptom further at next visit with possible x-rays at that time of your back.   Return to the clinic or go to the nearest emergency room if any of your symptoms worsen or new symptoms occur.    IF you received an x-ray today, you will receive an invoice from [facility]. Please contact [facility] at [phone] with questions or concerns regarding your invoice.   IF you received labwork today, you will receive an invoice from [place]. Please contact [facility] at [phone] with questions or concerns regarding your invoice.   Our billing staff will not be able to assist you with questions regarding bills from these companies.  You will be contacted with the lab results as soon as they are available. The fastest way to get your results is to activate your My Chart account. Instructions are located on the last page of this paperwork. If you have not heard from [place] regarding the results in 2 weeks, please contact this office.     I personally performed the services described in this documentation, which was scribed in my presence. The recorded information has been reviewed and considered for accuracy and completeness, addended by me as needed, and agree with information above.  Signed,   [name], MD Primary Care at [facility].  11/18/16 3:13 PM

## 2016-11-17 NOTE — Patient Instructions (Addendum)
  For diabetes, watching diet and decreasing alcohol may help with numbers. Plan to repeat test in 3 months.   For blood pressure, will change to 20/25mg  dose of lisinopril/hctz, but cutting back on alcohol should help.   For alcohol, I would like you to cut back on alcohol. If you are unable to cut back on your own, please call one of the resources below for help:  [facility]: [phone] [facility]: [phone]  For depression, restart Zoloft 50mg  once per day. If any thoughts of suicide - call 911 or go to emergency room.  Follow up in 3-4 weeks to talk about that further. Return to the clinic or go to the nearest emergency room if any of your symptoms worsen or new symptoms occur.  Call your neurologist for appointment to discuss the burning in your feet and to discuss medication options with taking Keppra.  We can also look at that symptom further at next visit with possible x-rays at that time of your back.   Return to the clinic or go to the nearest emergency room if any of your symptoms worsen or new symptoms occur.    IF you received an x-ray today, you will receive an invoice from [facility]. Please contact [facility] at [phone] with questions or concerns regarding your invoice.   IF you received labwork today, you will receive an invoice from [facility]. Please contact [facility] at [phone] with questions or concerns regarding your invoice.   Our billing staff will not be able to assist you with questions regarding bills from these companies.  You will be contacted with the lab results as soon as they are available. The fastest way to get your results is to activate your My Chart account. Instructions are located on the last page of this paperwork. If you have not heard from us regarding the results in 2 weeks, please contact this office.

## 2016-12-08 ENCOUNTER — Encounter: Payer: Self-pay | Admitting: Family Medicine

## 2016-12-08 ENCOUNTER — Ambulatory Visit (INDEPENDENT_AMBULATORY_CARE_PROVIDER_SITE_OTHER): Payer: BLUE CROSS/BLUE SHIELD | Admitting: Family Medicine

## 2016-12-08 VITALS — BP 123/83 | HR 84 | Temp 98.3°F | Ht 67.75 in | Wt 165.0 lb

## 2016-12-08 DIAGNOSIS — E119 Type 2 diabetes mellitus without complications: Secondary | ICD-10-CM

## 2016-12-08 DIAGNOSIS — I10 Essential (primary) hypertension: Secondary | ICD-10-CM | POA: Diagnosis not present

## 2016-12-08 DIAGNOSIS — E785 Hyperlipidemia, unspecified: Secondary | ICD-10-CM

## 2016-12-08 DIAGNOSIS — F32A Depression, unspecified: Secondary | ICD-10-CM

## 2016-12-08 DIAGNOSIS — F329 Major depressive disorder, single episode, unspecified: Secondary | ICD-10-CM | POA: Diagnosis not present

## 2016-12-08 MED ORDER — SERTRALINE HCL 50 MG PO TABS: 50.0000 mg | ORAL_TABLET | Freq: Every day | ORAL | 1 refills | Status: DC

## 2016-12-08 NOTE — Progress Notes (Signed)
By signing my name below, I, [name], attest that this documentation has been prepared under the direction and in the presence of [name], MD.  Electronically Signed: [name], Medical Scribe. 12/08/16. 10:38 AM.  Subjective:    Patient ID: [name], female    DOB: 1966-01-14, 51 y.o.   MRN: [id number]  HPI Chief Complaint  Patient presents with  . Follow-up    3 week f/u on medication    HPI Comments: [name] is a 51 y.o. female who presents to the Primary Care at [facility] for follow-up. Last seen March 8th for HTN and other concerns, including neuropathic pain, depression.   HTN: Increased lisinopril-HCTZ to 20/25 mg QD. Advised to decreased alcohol, she was drinking up to a pt per night at last visit. Discussed cessation and resources provided to receive help and counseling. Pt continues to take lisinopril and denies experiencing any negative side effects from her medication. Denies chest pain, light-headedness, dizziness or other negative side effects from her medication.  Depression: Suspected substance abuse induced mood disorder. Alcohol decreased discusses Takes zoloft and reports she feels good on her own without using the resources given. Pt no longer drinks daily and when she does drink it's not a pint at a time.  HLD: Started on liptor 10 mg at last visit. Compliant with her medication. Denies experiencing side affects from medications. Lab Results  Component Value Date   CHOL 229 (H) 10/06/2016   HDL 30 (L) 10/06/2016   LDLCALC Comment 10/06/2016   TRIG 437 (H) 10/06/2016   CHOLHDL 7.6 (H) 10/06/2016   Lab Results  Component Value Date   ALT 40 (H) 10/06/2016   AST 27 10/06/2016   ALKPHOS 108 10/06/2016   BILITOT 0.4 10/06/2016   DM: Diet changes and decrease alcohol recommended at last visit for treatment. 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  Patient Active Problem List   Diagnosis Date Noted  . 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 Asthma   . Hyperlipemia   . Hypertension   . Seizures (HCC)    Past Surgical History:  Procedure Laterality Date  . ABDOMINAL HYSTERECTOMY    . CHOLECYSTECTOMY     Allergies  Allergen Reactions  . Contrast Media [Iodinated Diagnostic Agents] Anaphylaxis    Contrast Dye   . Codeine   . Iohexol      Desc: UNABLE TO BREATH   . Other Other (See Comments)    Cashews and pistachios; reaction unknown   Prior to Admission medications   Medication Sig Start Date End Date Taking? Authorizing Provider  atorvastatin (LIPITOR) 10 MG tablet Take 1 tablet (10 mg total) by mouth daily at 6 PM. 11/17/16  Yes [name], MD  esomeprazole (NEXIUM) 20 MG packet Take 20 mg by mouth daily before breakfast.   Yes Historical Provider, MD  Fish Oil-Cholecalciferol (FISH OIL + D3) 1000-1000 MG-UNIT CAPS Take by mouth.   Yes Historical Provider, MD  levETIRAcetam (KEPPRA) 500 MG tablet TAKE ONE TABLET BY MOUTH IN THE MORNING AND TWO TABLETS AT BEDTIME 08/01/16  Yes Nilda Riggs, NP  lisinopril-hydrochlorothiazide (PRINZIDE,ZESTORETIC) 20-25 MG tablet Take 1 tablet by mouth daily. 11/17/16  Yes [name], MD  Multiple Vitamin (  MULTIVITAMIN) tablet Take 1 tablet by mouth daily.   Yes Historical Provider, MD  albuterol (PROVENTIL HFA;VENTOLIN HFA) 108 (90 Base) MCG/ACT inhaler Inhale 2 puffs into the lungs every 6 (six) hours as needed for wheezing or shortness of breath. Patient not taking: Reported on 10/19/2016 10/06/16   [name], MD  beclomethasone (QVAR) 80 MCG/ACT inhaler Inhale 1 puff into the lungs 2 (two) times daily. Patient not taking: Reported on 07/27/2016 10/04/15   [name], MD  sertraline (ZOLOFT) 50 MG tablet Take 1 tablet  (50 mg total) by mouth daily. 11/17/16   [name], MD   Social History   Social History  . Marital status: Widowed    Spouse name: N/A  . Number of children: 2  . Years of education: 12   Occupational History  . Not on file.   Social History Main Topics  . Smoking status: Former Games developer  . Smokeless tobacco: Never Used  . Alcohol use 1.2 oz/week    2 Cans of beer per week     Comment: beer, liquor  . Drug use: No  . Sexual activity: Yes    Birth control/ protection: Surgical   Other Topics Concern  . Not on file   Social History Narrative   Patient is widowed.   Patient has 2 children.   Patient is currently unemployed.   Patient has a high school education.   Patient is right-handed.   Patient drinks 2 glass of caffeine daily ( soda and tea).   Review of Systems  Constitutional: Negative for fatigue and unexpected weight change.  Respiratory: Negative for chest tightness and shortness of breath.   Cardiovascular: Negative for chest pain, palpitations and leg swelling.  Gastrointestinal: Negative for abdominal pain.  Musculoskeletal: Negative for myalgias.  Allergic/Immunologic: Positive for environmental allergies.  Neurological: Negative for dizziness, syncope, light-headedness and headaches.  Psychiatric/Behavioral: Negative for dysphoric mood.    Objective:  Physical Exam  Constitutional: She is oriented to person, place, and time. She appears well-developed and well-nourished. No distress.  HENT:  Head: Normocephalic and atraumatic.  Eyes: Conjunctivae and EOM are normal. Pupils are equal, round, and reactive to light.  Neck: Neck supple. Carotid bruit is not present.  Cardiovascular: Normal rate, regular rhythm, normal heart sounds and intact distal pulses.  Exam reveals no gallop and no friction rub.   No murmur heard. Pulmonary/Chest: Effort normal and breath sounds normal. No respiratory distress. She has no wheezes. She has no rales.  Abdominal:  Soft. She exhibits no pulsatile midline mass. There is no tenderness.  Neurological: She is alert and oriented to person, place, and time.  Skin: Skin is warm and dry.  Psychiatric: She has a normal mood and affect. Her behavior is normal.  Nursing note and vitals reviewed.   Vitals:   12/08/16 1032  BP: 123/83  Pulse: 84  Temp: 98.3 F (36.8 C)  TempSrc: Oral  SpO2: 97%  Weight: 165 lb (74.8 kg)  Height: 5' 7.75" (1.721 m)  Body mass index is 25.27 kg/m. Assessment & Plan:   [name] is a 51 y.o. female Essential hypertension  - Improved on current regiment and cutting back on alcohol. Commended on her efforts. Continue same regimen for now with recheck in 3 months.  Depression, unspecified depression type - Plan: sertraline (ZOLOFT) 50 MG tablet  - Improved. Continue Zoloft, and continued to decrease alcohol use. Resources provided last visit if difficulty with continuing to cut back.  Hyperlipidemia, unspecified hyperlipidemia type  - Tolerating Lipitor. Plan on labs next visit. RTC precautions if new intolerances or myalgias.  Diabetes mellitus type 2, diet-controlled (HCC)  - Continue diet control and changes, recheck levels at follow-up visit in 3 months.  Meds ordered this encounter  Medications  . sertraline (ZOLOFT) 50 MG tablet    Sig: Take 1 tablet (50 mg total) by mouth daily.    Dispense:  90 tablet    Refill:  1   Patient Instructions   Great work on cutting back on drinking. Blood pressure is much better today. Recheck in next 2-3 months for repeat diabetes test, but no other changes for now. Let me know if you have any questions in the meantime    IF you received an x-ray today, you will receive an invoice from [facility]. Please contact [facility] at [phone] with questions or concerns regarding your invoice.   IF you received labwork today, you will receive an invoice from [facility]. Please contact [facility] at [phone]  with questions or concerns regarding your invoice.   Our billing staff will not be able to assist you with questions regarding bills from these companies.  You will be contacted with the lab results as soon as they are available. The fastest way to get your results is to activate your My Chart account. Instructions are located on the last page of this paperwork. If you have not heard from us regarding the results in 2 weeks, please contact this office.        I personally performed the services described in this documentation, which was scribed in my presence. The recorded information has been reviewed and considered for accuracy and completeness, addended by me as needed, and agree with information above.  Signed,   Meredith StaggersJeffrey Emberli Ballester, MD Primary Care at [facility].  12/11/16 2:15 PM

## 2016-12-08 NOTE — Patient Instructions (Addendum)
Great work on cutting back on drinking. Blood pressure is much better today. Recheck in next 2-3 months for repeat diabetes test, but no other changes for now. Let me know if you have any questions in the meantime    IF you received an x-ray today, you will receive an invoice from [facility]. Please contact [facility] at [phone] with questions or concerns regarding your invoice.   IF you received labwork today, you will receive an invoice from [facility]. Please contact [facility] at [phone] with questions or concerns regarding your invoice.   Our billing staff will not be able to assist you with questions regarding bills from these companies.  You will be contacted with the lab results as soon as they are available. The fastest way to get your results is to activate your My Chart account. Instructions are located on the last page of this paperwork. If you have not heard from us regarding the results in 2 weeks, please contact this office.

## 2017-02-07 ENCOUNTER — Ambulatory Visit (INDEPENDENT_AMBULATORY_CARE_PROVIDER_SITE_OTHER): Payer: BLUE CROSS/BLUE SHIELD | Admitting: Family Medicine

## 2017-02-07 ENCOUNTER — Encounter: Payer: Self-pay | Admitting: Family Medicine

## 2017-02-07 VITALS — BP 135/92 | HR 80 | Temp 97.7°F | Resp 16 | Ht 67.5 in | Wt 170.0 lb

## 2017-02-07 DIAGNOSIS — L237 Allergic contact dermatitis due to plants, except food: Secondary | ICD-10-CM

## 2017-02-07 DIAGNOSIS — R739 Hyperglycemia, unspecified: Secondary | ICD-10-CM

## 2017-02-07 LAB — GLUCOSE, POCT (MANUAL RESULT ENTRY): POC GLUCOSE: 109 mg/dL — AB (ref 70–99)

## 2017-02-07 LAB — POCT GLYCOSYLATED HEMOGLOBIN (HGB A1C): Hemoglobin A1C: 6.3

## 2017-02-07 MED ORDER — PREDNISONE 20 MG PO TABS: ORAL_TABLET | ORAL | 0 refills | Status: DC

## 2017-02-07 NOTE — Progress Notes (Signed)
By signing my name below, I, [name], attest that this documentation has been prepared under the direction and in the presence of [name], MD.  Electronically Signed: [name], Medical Scribe. 02/07/17. 6:16 PM.  Subjective:    Patient ID: [name], female    DOB: [birth year], 51 y.o.   MRN: [id number]  HPI Chief Complaint  Patient presents with  . Rash    face /arms    HPI Comments: [name] is a 51 y.o. female who presents to Primary Care at [facility] complaining of rash with slight blistering for 3 days. Rash started on her face and it spread to the back of her neck and arms. Pt was weed eating the day before with short sleeves, and without protection glasses or gloves. Pt has tried OTC anti-itching cream without relief of her sxs. States she gets a rash like this every year and she is usually treated with steroids.Denies blurry vision or visual symptoms, no respiratory symptoms.  Patient Active Problem List   Diagnosis Date Noted  . 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 Asthma   . Hyperlipemia   . Hypertension   . Seizures (HCC)    Past Surgical History:  Procedure Laterality Date  . ABDOMINAL HYSTERECTOMY    . CHOLECYSTECTOMY     Allergies  Allergen Reactions  . Contrast Media [Iodinated Diagnostic Agents] Anaphylaxis    Contrast Dye   . Codeine   . Iohexol      Desc: UNABLE TO BREATH   . Other Other (See Comments)    Cashews and pistachios; reaction unknown   Prior to Admission medications   Medication Sig Start Date End Date Taking? Authorizing Provider  atorvastatin (LIPITOR) 10 MG tablet Take 1 tablet (10 mg total) by mouth daily at 6 PM. 11/17/16  Yes [name], MD  esomeprazole (NEXIUM) 20 MG packet Take 20 mg by mouth daily before breakfast.   Yes [provider]  Fish Oil-Cholecalciferol (FISH OIL + D3) 1000-1000  MG-UNIT CAPS Take by mouth.   Yes [provider]  levETIRAcetam (KEPPRA) 500 MG tablet TAKE ONE TABLET BY MOUTH IN THE MORNING AND TWO TABLETS AT BEDTIME 08/01/16  Yes Nilda Riggs, NP  lisinopril-hydrochlorothiazide (PRINZIDE,ZESTORETIC) 20-25 MG tablet Take 1 tablet by mouth daily. 11/17/16  Yes [name], MD  Multiple Vitamin (MULTIVITAMIN) tablet Take 1 tablet by mouth daily.   Yes [provider]  sertraline (ZOLOFT) 50 MG tablet Take 1 tablet (50 mg total) by mouth daily. 12/08/16  Yes [name], MD  albuterol (PROVENTIL HFA;VENTOLIN HFA) 108 (90 Base) MCG/ACT inhaler Inhale 2 puffs into the lungs every 6 (six) hours as needed for wheezing or shortness of breath. Patient not taking: Reported on 10/19/2016 10/06/16   [name], MD  beclomethasone (QVAR) 80 MCG/ACT inhaler Inhale 1 puff into the lungs 2 (two) times daily. Patient not taking: Reported on 07/27/2016 10/04/15   [name], MD   Social History   Social History  . Marital status: Widowed    Spouse name: N/A  . Number of children: 2  . Years of education: 12   Occupational History  . Not on file.   Social History Main Topics  . Smoking status: Former Games developer  . Smokeless tobacco: Never Used  . Alcohol use 1.2 oz/week    2 Cans of beer per  week     Comment: beer, liquor  . Drug use: No  . Sexual activity: Yes    Birth control/ protection: Surgical   Other Topics Concern  . Not on file   Social History Narrative   Patient is widowed.   Patient has 2 children.   Patient is currently unemployed.   Patient has a high school education.   Patient is right-handed.   Patient drinks 2 glass of caffeine daily ( soda and tea).   Review of Systems  Skin: Positive for rash.   Objective:  Physical Exam  Constitutional: She appears well-developed and well-nourished. No distress.  HENT:  Head: Normocephalic and atraumatic.  Eyes: Conjunctivae are normal.  Neck: Neck  supple.  Cardiovascular: Normal rate.   Pulmonary/Chest: Effort normal.  Neurological: She is alert.  Skin: Skin is warm and dry.  Scattered vesicular rash on right base of her thumb, forearm, upper right arm, similar appearing rash on left forearm No lesions on her hands including no interdigital lesions Few small papular vesicular rash posterior neck Back and abdomen are unaffected Slight swelling of the right ear lobe Erythema with slight soft tissue swelling and faint erythematous patches right malar face, forehead left lateral, right infraorbital area Few clustered patches of vesicles anterior neck and upper chest  Psychiatric: She has a normal mood and affect. Her behavior is normal.  Nursing note and vitals reviewed.   Vitals:   02/07/17 1745  BP: (!) 135/92  Pulse: 80  Resp: 16  Temp: 97.7 F (36.5 C)  TempSrc: Oral  SpO2: 99%  Weight: 170 lb (77.1 kg)  Height: 5' 7.5" (1.715 m)  Body mass index is 26.23 kg/m.    Results for orders placed or performed in visit on 02/07/17  POCT glucose (manual entry)  Result Value Ref Range   POC Glucose 109 (A) 70 - 99 mg/dl  POCT glycosylated hemoglobin (Hb A1C)  Result Value Ref Range   Hemoglobin A1C 6.3     Assessment & Plan:    [name] is a 51 y.o. female Contact dermatitis due to poison ivy - Plan: predniSONE (DELTASONE) 20 MG tablet  - Poison ivy affecting face, neck. Scattered areas on upper chest and arms.  - Start prednisone taper, side effects/risks discussed.  - Topical treatment for areas on arms, neck, chest.  Hyperglycemia - Plan: POCT glucose (manual entry), POCT glycosylated hemoglobin (Hb A1C)  - A1c stable, okay to use prednisone as above, discussed potential increase in glucose while on prednisone. RTC precautions if urinary frequency, increased thirst, blurry vision.   Meds ordered this encounter  Medications  . predniSONE (DELTASONE) 20 MG tablet    Sig: 3 by mouth for 3 days, then 2 by  mouth for 2 days, then 1 by mouth for 2 days, then 1/2 by mouth for 2 days.    Dispense:  16 tablet    Refill:  0   Patient Instructions      Poison Ivy Dermatitis Poison ivy dermatitis is inflammation of the skin that is caused by the allergens on the leaves of the poison ivy plant. The skin reaction often involves redness, swelling, blisters, and extreme itching. What are the causes? This condition is caused by a specific chemical (urushiol) found in the sap of the poison ivy plant. This chemical is sticky and can be easily spread to people, animals, and objects. You can get poison ivy dermatitis by:  Having direct contact with a poison ivy plant.  Touching animals, other people, or objects that have come in contact with poison ivy and have the chemical on them. What increases the risk? This condition is more likely to develop in:  People who are outdoors often.  People who go outdoors without wearing protective clothing, such as closed shoes, long pants, and a long-sleeved shirt. What are the signs or symptoms? Symptoms of this condition include:  Redness and itching.  A rash that often includes bumps and blisters. The rash usually appears 48 hours after exposure.  Swelling. This may occur if the reaction is more severe. Symptoms usually last for 1-2 weeks. However, the first time you develop this condition, symptoms may last 3-4 weeks. How is this diagnosed? This condition may be diagnosed based on your symptoms and a physical exam. Your health care provider may also ask you about any recent outdoor activity. How is this treated? Treatment for this condition will vary depending on how severe it is. Treatment may include:  Hydrocortisone creams or calamine lotions to relieve itching.  Oatmeal baths to soothe the skin.  Over-the-counter antihistamine tablets.  Oral steroid medicine for more severe outbreaks. Follow these instructions at home:  Take or apply  over-the-counter and prescription medicines only as told by your health care provider.  Wash exposed skin as soon as possible with soap and cold water.  Use hydrocortisone creams or calamine lotion as needed to soothe the skin and relieve itching.  Take oatmeal baths as needed. Use colloidal oatmeal. You can get this at your local pharmacy or grocery store. Follow the instructions on the packaging.  Do not scratch or rub your skin.  While you have the rash, wash clothes right after you wear them. How is this prevented?  Learn to identify the poison ivy plant and avoid contact with the plant. This plant can be recognized by the number of leaves. Generally, poison ivy has three leaves with flowering branches on a single stem. The leaves are typically glossy, and they have jagged edges that come to a point at the front.  If you have been exposed to poison ivy, thoroughly wash with soap and water right away. You have about 30 minutes to remove the plant resin before it will cause the rash. Be sure to wash under your fingernails because any plant resin there will continue to spread the rash.  When hiking or camping, wear clothes that will help you to avoid exposure on the skin. This includes long pants, a long-sleeved shirt, tall socks, and hiking boots. You can also apply preventive lotion to your skin to help limit exposure.  If you suspect that your clothes or outdoor gear came in contact with poison ivy, rinse them off outside with a garden hose before you bring them inside your house. Contact a health care provider if:  You have open sores in the rash area.  You have more redness, swelling, or pain in the affected area.  You have redness that spreads beyond the rash area.  You have fluid, blood, or pus coming from the affected area.  You have a fever.  You have a rash over a large area of your body.  You have a rash on your eyes, mouth, or genitals.  Your rash does not improve  after a few days. Get help right away if:  Your face swells or your eyes swell shut.  You have trouble breathing.  You have trouble swallowing. This information is not intended to replace advice given to you  by your health care provider. Make sure you discuss any questions you have with your health care provider. Document Released: 08/26/2000 Document Revised: 02/04/2016 Document Reviewed: 02/04/2015 Elsevier Interactive Patient Education  2017 [employer].   IF you received an x-ray today, you will receive an invoice from [facility]. Please contact [facility] at [phone] with questions or concerns regarding your invoice.   IF you received labwork today, you will receive an invoice from [facility]. Please contact [facility] at [phone] with questions or concerns regarding your invoice.   Our billing staff will not be able to assist you with questions regarding bills from these companies.  You will be contacted with the lab results as soon as they are available. The fastest way to get your results is to activate your My Chart account. Instructions are located on the last page of this paperwork. If you have not heard from [place] regarding the results in 2 weeks, please contact this office.       /I personally performed the services described in this documentation, which was scribed in my presence. The recorded information has been reviewed and considered for accuracy and completeness, addended by me as needed, and agree with information above.  Signed,   [name], MD Primary Care at [facility].  02/07/17 6:47 PM

## 2017-02-07 NOTE — Patient Instructions (Addendum)
Poison Ivy Dermatitis Poison ivy dermatitis is inflammation of the skin that is caused by the allergens on the leaves of the poison ivy plant. The skin reaction often involves redness, swelling, blisters, and extreme itching. What are the causes? This condition is caused by a specific chemical (urushiol) found in the sap of the poison ivy plant. This chemical is sticky and can be easily spread to people, animals, and objects. You can get poison ivy dermatitis by:  Having direct contact with a poison ivy plant.  Touching animals, other people, or objects that have come in contact with poison ivy and have the chemical on them. What increases the risk? This condition is more likely to develop in:  People who are outdoors often.  People who go outdoors without wearing protective clothing, such as closed shoes, long pants, and a long-sleeved shirt. What are the signs or symptoms? Symptoms of this condition include:  Redness and itching.  A rash that often includes bumps and blisters. The rash usually appears 48 hours after exposure.  Swelling. This may occur if the reaction is more severe. Symptoms usually last for 1-2 weeks. However, the first time you develop this condition, symptoms may last 3-4 weeks. How is this diagnosed? This condition may be diagnosed based on your symptoms and a physical exam. Your health care provider may also ask you about any recent outdoor activity. How is this treated? Treatment for this condition will vary depending on how severe it is. Treatment may include:  Hydrocortisone creams or calamine lotions to relieve itching.  Oatmeal baths to soothe the skin.  Over-the-counter antihistamine tablets.  Oral steroid medicine for more severe outbreaks. Follow these instructions at home:  Take or apply over-the-counter and prescription medicines only as told by your health care provider.  Wash exposed skin as soon as possible with soap and cold water.  Use  hydrocortisone creams or calamine lotion as needed to soothe the skin and relieve itching.  Take oatmeal baths as needed. Use colloidal oatmeal. You can get this at your local pharmacy or grocery store. Follow the instructions on the packaging.  Do not scratch or rub your skin.  While you have the rash, wash clothes right after you wear them. How is this prevented?  Learn to identify the poison ivy plant and avoid contact with the plant. This plant can be recognized by the number of leaves. Generally, poison ivy has three leaves with flowering branches on a single stem. The leaves are typically glossy, and they have jagged edges that come to a point at the front.  If you have been exposed to poison ivy, thoroughly wash with soap and water right away. You have about 30 minutes to remove the plant resin before it will cause the rash. Be sure to wash under your fingernails because any plant resin there will continue to spread the rash.  When hiking or camping, wear clothes that will help you to avoid exposure on the skin. This includes long pants, a long-sleeved shirt, tall socks, and hiking boots. You can also apply preventive lotion to your skin to help limit exposure.  If you suspect that your clothes or outdoor gear came in contact with poison ivy, rinse them off outside with a garden hose before you bring them inside your house. Contact a health care provider if:  You have open sores in the rash area.  You have more redness, swelling, or pain in the affected area.  You have redness that spreads beyond   the rash area.  You have fluid, blood, or pus coming from the affected area.  You have a fever.  You have a rash over a large area of your body.  You have a rash on your eyes, mouth, or genitals.  Your rash does not improve after a few days. Get help right away if:  Your face swells or your eyes swell shut.  You have trouble breathing.  You have trouble swallowing. This  information is not intended to replace advice given to you by your health care provider. Make sure you discuss any questions you have with your health care provider. Document Released: 08/26/2000 Document Revised: 02/04/2016 Document Reviewed: 02/04/2015 Elsevier Interactive Patient Education  2017 [employer]     IF you received an x-ray today, you will receive an invoice from [facility]. Please contact [facility] at [phone] with questions or concerns regarding your invoice.   IF you received labwork today, you will receive an invoice from [facility]. Please contact [facility] at [phone] with questions or concerns regarding your invoice.   Our billing staff will not be able to assist you with questions regarding bills from these companies.  You will be contacted with the lab results as soon as they are available. The fastest way to get your results is to activate your My Chart account. Instructions are located on the last page of this paperwork. If you have not heard from us regarding the results in 2 weeks, please contact this office.      

## 2017-02-21 ENCOUNTER — Encounter (HOSPITAL_COMMUNITY): Payer: Self-pay | Admitting: *Deleted

## 2017-03-13 ENCOUNTER — Other Ambulatory Visit: Payer: Self-pay | Admitting: Family Medicine

## 2017-03-13 ENCOUNTER — Encounter: Payer: Self-pay | Admitting: Nurse Practitioner

## 2017-03-13 DIAGNOSIS — E785 Hyperlipidemia, unspecified: Secondary | ICD-10-CM

## 2017-03-16 ENCOUNTER — Ambulatory Visit: Payer: BLUE CROSS/BLUE SHIELD | Admitting: Family Medicine

## 2017-03-23 ENCOUNTER — Encounter: Payer: Self-pay | Admitting: Family Medicine

## 2017-03-23 ENCOUNTER — Ambulatory Visit (INDEPENDENT_AMBULATORY_CARE_PROVIDER_SITE_OTHER): Payer: BLUE CROSS/BLUE SHIELD

## 2017-03-23 ENCOUNTER — Ambulatory Visit (INDEPENDENT_AMBULATORY_CARE_PROVIDER_SITE_OTHER): Payer: BLUE CROSS/BLUE SHIELD | Admitting: Family Medicine

## 2017-03-23 VITALS — BP 145/94 | HR 83 | Temp 97.9°F | Resp 16 | Ht 67.5 in | Wt 170.6 lb

## 2017-03-23 DIAGNOSIS — R05 Cough: Secondary | ICD-10-CM

## 2017-03-23 DIAGNOSIS — Z87891 Personal history of nicotine dependence: Secondary | ICD-10-CM | POA: Diagnosis not present

## 2017-03-23 DIAGNOSIS — F439 Reaction to severe stress, unspecified: Secondary | ICD-10-CM

## 2017-03-23 DIAGNOSIS — R079 Chest pain, unspecified: Secondary | ICD-10-CM

## 2017-03-23 DIAGNOSIS — J45909 Unspecified asthma, uncomplicated: Secondary | ICD-10-CM

## 2017-03-23 DIAGNOSIS — R490 Dysphonia: Secondary | ICD-10-CM

## 2017-03-23 DIAGNOSIS — R112 Nausea with vomiting, unspecified: Secondary | ICD-10-CM

## 2017-03-23 DIAGNOSIS — E785 Hyperlipidemia, unspecified: Secondary | ICD-10-CM

## 2017-03-23 DIAGNOSIS — R1010 Upper abdominal pain, unspecified: Secondary | ICD-10-CM

## 2017-03-23 DIAGNOSIS — R059 Cough, unspecified: Secondary | ICD-10-CM

## 2017-03-23 DIAGNOSIS — I10 Essential (primary) hypertension: Secondary | ICD-10-CM

## 2017-03-23 MED ORDER — ATORVASTATIN CALCIUM 10 MG PO TABS: ORAL_TABLET | ORAL | 1 refills | Status: DC

## 2017-03-23 MED ORDER — LISINOPRIL-HYDROCHLOROTHIAZIDE 20-25 MG PO TABS: 1.0000 | ORAL_TABLET | Freq: Every day | ORAL | 1 refills | Status: DC

## 2017-03-23 MED ORDER — ALBUTEROL SULFATE HFA 108 (90 BASE) MCG/ACT IN AERS: 2.0000 | INHALATION_SPRAY | Freq: Four times a day (QID) | RESPIRATORY_TRACT | 0 refills | Status: DC | PRN

## 2017-03-23 NOTE — Progress Notes (Signed)
Subjective:  This chart was scribed for [name], MD by Tamsen Roers, at [place] at [facility].  This patient was seen in room 25 and the patient's care was started at 10:59 AM.   Chief Complaint  Patient presents with  . Follow-up     Patient ID: [name], female    DOB: 11-01-1965, 51 y.o.   MRN: [id number]  HPI  HPI Comments: [name] is a 51 y.o. female who presents to Primary Care at [facility]. Patient has a history of hypertension and hyperlipidemia, depression and diabetes.   Cough: Patient is complaining of a choking sensation which comes on after dry coughing which started about a month and a half ago. She coughs so much that it causes her to sweat profusely and eventually vomit. She has also has some right sided sharp chest pains.  She denies any fever/chills with this. Patient has some wheezing about once per week and has more difficulty breathing due to the heat. She uses her daughters inhaler occasionally but states that it does not help.  She feels that her vocal chords are now damaged as she has a change to her voice (more hoarse).  She has difficulty swallowing her food and cannot take pills like she used to be able to.  Patient was not able to purchase the Qvar as it was too expensive.  She did have the flu a couple of months ago and states that she has not really been the same since.  She denies any heart burn. She is complaint with her Nexium and feels that it works well for her.  She takes benadryl occasionally.  Patient is no longer a smoker and quit about 10 years ago.  She has quit multiple times over her life span.  Patient does sweat at night time but relates it to [facility].  She has had a hysterectomy but still has her ovaries.   She also has abdominal pain and states that she has always had abdominal issues.  [place] gastroenterologist.    Hypertension:  Was controlled at higher doses of lisinopril HCTZ as well as  cutting back on alcohol.  Continued same dose--- Patient drank "some" since her last visit and states that it is "way less" than what she used to drink.  She has about 3-4 drinks per week and consumes liquor as well as beer.  Her pressure readings have been about the same ranging around 140/80.  She figured it would be high today as she has "many things going on".  She also tries to be careful with her sodium intake.  Lab Results  Component Value Date   CREATININE 0.65 10/06/2016    Depression: Improved last visit with decreased alcohol and tolerated Zoloft. --- Patient states that she does not feel depressed.  She has not been taking the Zoloft for the past three weeks as she has been doing "so good" lately. She does have stress regarding rental property and "every day stuff". She did not have any side effects with the Zoloft.    Hyperlipidemia:  Tolerating Lipitor.----Patient ran out of her cholesterol medication two weeks ago.  She was sent medication on July 3rd but was not aware of this.  She called [facility] for a refill but was told that they did not hear from the doctors office.  Lab Results  Component Value Date   CHOL 229 (H) 10/06/2016   HDL 30 (L) 10/06/2016   [place] Comment 10/06/2016  TRIG 437 (H) 10/06/2016   CHOLHDL 7.6 (H) 10/06/2016    Lab Results  Component Value Date   ALT 40 (H) 10/06/2016   AST 27 10/06/2016   ALKPHOS 108 10/06/2016   BILITOT 0.4 10/06/2016    Diabetes:Diet and exercise as initial treatment.  Lab Results  Component Value Date   HGBA1C 6.3 02/07/2017     Patient Active Problem List   Diagnosis Date Noted  . 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 Asthma   . Hyperlipemia   . Hypertension   . Seizures ([place])    Past Surgical History:  Procedure Laterality Date  . ABDOMINAL HYSTERECTOMY    . CHOLECYSTECTOMY     Allergies  Allergen Reactions    . Contrast Media [Iodinated Diagnostic Agents] Anaphylaxis    Contrast Dye   . Codeine   . Iohexol      Desc: UNABLE TO BREATH   . Other Other (See Comments)    Cashews and pistachios; reaction unknown   Prior to Admission medications   Medication Sig Start Date End Date Taking? Authorizing Provider  albuterol (PROVENTIL HFA;VENTOLIN HFA) 108 (90 Base) MCG/ACT inhaler Inhale 2 puffs into the lungs every 6 (six) hours as needed for wheezing or shortness of breath. 10/06/16  Yes [name], MD  atorvastatin (LIPITOR) 10 MG tablet TAKE 1 TABLET BY MOUTH ONCE DAILY AT 6:00 PM 03/14/17  Yes [name], MD  beclomethasone (QVAR) 80 MCG/ACT inhaler Inhale 1 puff into the lungs 2 (two) times daily. 10/04/15  Yes [name], MD  esomeprazole (NEXIUM) 20 MG packet Take 20 mg by mouth daily before breakfast.   Yes [provider]  Fish Oil-Cholecalciferol (FISH OIL + D3) 1000-1000 MG-UNIT CAPS Take by mouth.   Yes [provider]  levETIRAcetam (KEPPRA) 500 MG tablet TAKE ONE TABLET BY MOUTH IN THE MORNING AND TWO TABLETS AT BEDTIME 08/01/16  Yes Dennie Bible, NP  lisinopril-hydrochlorothiazide (PRINZIDE,ZESTORETIC) 20-25 MG tablet Take 1 tablet by mouth daily. 11/17/16  Yes [name], MD  Multiple Vitamin (MULTIVITAMIN) tablet Take 1 tablet by mouth daily.   Yes [provider]  predniSONE (DELTASONE) 20 MG tablet 3 by mouth for 3 days, then 2 by mouth for 2 days, then 1 by mouth for 2 days, then 1/2 by mouth for 2 days. 02/07/17  Yes [name], MD  sertraline (ZOLOFT) 50 MG tablet Take 1 tablet (50 mg total) by mouth daily. 12/08/16  Yes [name], MD   Social History   Social History  . Marital status: Widowed    Spouse name: N/A  . Number of children: 2  . Years of education: 12   Occupational History  . Not on file.   Social History Main Topics  . Smoking status: Former Research scientist (life sciences)  . Smokeless tobacco: Never Used  .  Alcohol use 1.2 oz/week    2 Cans of beer per week     Comment: beer, liquor  . Drug use: No  . Sexual activity: Yes    Birth control/ protection: Surgical   Other Topics Concern  . Not on file   Social History Narrative   Patient is widowed.   Patient has 2 children.   Patient is currently unemployed.   Patient has a high school education.   Patient is right-handed.   Patient drinks 2 glass of caffeine daily (  soda and tea).     Review of Systems  Constitutional: Negative for chills and fever.  HENT: Positive for trouble swallowing and voice change. Negative for facial swelling.   Eyes: Negative for pain and redness.  Respiratory: Positive for cough and choking.   Cardiovascular: Positive for chest pain.  Gastrointestinal: Positive for abdominal pain.  Musculoskeletal: Negative for neck pain and neck stiffness.  Neurological: Negative for syncope and speech difficulty.       Objective:   Physical Exam  Constitutional: She appears well-developed and well-nourished. No distress.  HENT:  Head: Normocephalic and atraumatic.  Cardiovascular: Normal rate, regular rhythm and normal heart sounds.  Exam reveals no gallop and no friction rub.   No murmur heard. Pulmonary/Chest: Effort normal. No respiratory distress. She has no wheezes. She has no rales.  Abdominal:  Right up quadrant tenderness as well as left upper quadrant tenderness. No rebound or guarding, abdomen is non distended.   Neurological: She is alert.     Vitals:   03/23/17 1027  BP: (!) 145/94  Pulse: 83  Resp: 16  Temp: 97.9 F (36.6 C)  TempSrc: Oral  SpO2: 98%  Weight: 170 lb 9.6 oz (77.4 kg)  Height: 5' 7.5" (1.715 m)    EKG: sinus rhythm, no acute findings.    Dg Chest 2 View  Result Date: 03/23/2017 CLINICAL DATA:  Cough, chest pain EXAM: CHEST  2 VIEW COMPARISON:  10/08/2015 FINDINGS: Mild peribronchial thickening. Heart and mediastinal contours are within normal limits. No focal opacities  or effusions. No acute bony abnormality. IMPRESSION: Mild bronchitic changes. Electronically Signed   By: Rolm Baptise M.D.   On: 03/23/2017 12:05      Assessment & Plan:    DAWNNA GRITZ is a 51 y.o. female Cough - Plan: Ambulatory referral to ENT, CBC, DG Chest 2 View, albuterol (PROVENTIL HFA;VENTOLIN HFA) 108 (90 Base) MCG/ACT inhaler Asthma, unspecified asthma severity, unspecified whether complicated, unspecified whether persistent - Plan: DG Chest 2 View, albuterol (PROVENTIL HFA;VENTOLIN HFA) 108 (90 Base) MCG/ACT inhaler Right-sided chest pain - Plan: EKG 12-Lead  -Cough possibly multifactorial including upper airway irritation with history of reflux, asthma as unable to afford inhaled corticosteroid, postnasal drip with allergies. Mild bronchitic changes on chest x-ray but no apparent infiltrate.   -Chest pain likely intercostal muscle pain from forceful coughing. No acute findings on EKG.  -Trigger foods for GERD discussed including avoidance of alcohol as much as possible. Continue Nexium.   -Start antihistamine such as Zyrtec, Allegra or Claritin.  - Albuterol if needed, but if persistent/frequent use, would recommend restarting inhaled corticosteroid, and can look at possible less costly options.  -RTC precautions discussed if worsening cough or other worsening symptoms prior to planned follow-up in 2 weeks  Hyperlipidemia, unspecified hyperlipidemia type - Plan: Lipid panel, atorvastatin (LIPITOR) 10 MG tablet  - Off Lipitor recently, will hold on lipid panel until approximate 6 weeks of medication use. Restart Lipitor 10 mg daily as tolerated previously.  Essential hypertension - Plan: lisinopril-hydrochlorothiazide (PRINZIDE,ZESTORETIC) 20-25 MG tablet  -Borderline elevation. With current coughing may also cause some elevations. Suspect this will continue to improve with cutting back on alcohol, sodium in diet. No medication changes for now. Monitor outside of office and  recheck in 2 weeks  Hoarseness - Plan: Ambulatory referral to ENT History of tobacco abuse - Plan: Ambulatory referral to ENT  -May be related to cough as above, including possible reflux. However with tobacco history and new hoarseness will  have her evaluated by ENT for possible laryngoscopy. Treat allergies and reflux as above.  Pain of upper abdomen - Plan: Lipase, Comprehensive metabolic panel Nausea and vomiting, intractability of vomiting not specified, unspecified vomiting type  -May have component of posttussive emesis, but with upper abdominal pain we'll check CMP and lipase. Abdominal wall musculature discomfort with coughing also possible. Advised to cut back on alcohol as well as discussed above. RTC precautions if worsening  Situational stress  -Denies worsening depression. She has stopped her SSRI. Recommend restarting if increased anxiety/depression symptoms. Handout given on stress and stress management.  Meds ordered this encounter  Medications  . atorvastatin (LIPITOR) 10 MG tablet    Sig: TAKE 1 TABLET BY MOUTH ONCE DAILY AT 6:00 PM    Dispense:  90 tablet    Refill:  1  . lisinopril-hydrochlorothiazide (PRINZIDE,ZESTORETIC) 20-25 MG tablet    Sig: Take 1 tablet by mouth daily.    Dispense:  90 tablet    Refill:  1  . albuterol (PROVENTIL HFA;VENTOLIN HFA) 108 (90 Base) MCG/ACT inhaler    Sig: Inhale 2 puffs into the lungs every 6 (six) hours as needed for wheezing or shortness of breath.    Dispense:  1 Inhaler    Refill:  0   Patient Instructions   Continue to cut back on alcohol as that can affect blood pressure. Cut back on sodium/salt in the diet including restaurant and frozen foods. Keep a record of your blood pressures outside of the office. If it remains over 140/90 in upcoming weeks - then return for med changes.   Restart Lipitor, then return for lab only visit in next 6 weeks.   If you are feeling more anxiety or depression symptoms, I would recommend  restarting zoloft.   Continue nexium everyday, try zyrtec, allegra, or claritin once per day as a trial to see if there is an allergic part to your cough. I will check xray and will refer you to Ear nose and throat for the hoarseness. Albuterol if needed for cough, or wheeze.  If you do require albuterol daily, then we need to look into a steroid inhaler again.   I will check pancreas, liver and other blood tests for abdominal pain. Return to the clinic or go to the nearest emergency room if any of your symptoms worsen or new symptoms occur.  Recheck for cough and other symptoms in next 2 weeks - Return to the clinic or go to the nearest emergency room if any of your symptoms worsen or new symptoms occur.  Avoid foods known to worsen heartburn:   Food Choices for Gastroesophageal Reflux Disease, Adult When you have gastroesophageal reflux disease (GERD), the foods you eat and your eating habits are very important. Choosing the right foods can help ease the discomfort of GERD. Consider working with a diet and nutrition specialist (dietitian) to help you make healthy food choices. What general guidelines should I follow? Eating plan  Choose healthy foods low in fat, such as fruits, vegetables, whole grains, low-fat dairy products, and lean meat, fish, and poultry.  Eat frequent, small meals instead of three large meals each day. Eat your meals slowly, in a relaxed setting. Avoid bending over or lying down until 2-3 hours after eating.  Limit high-fat foods such as fatty meats or fried foods.  Limit your intake of oils, butter, and shortening to less than 8 teaspoons each day.  Avoid the following: ? Foods that cause symptoms. These may be  different for different people. Keep a food diary to keep track of foods that cause symptoms. ? Alcohol. ? Drinking large amounts of liquid with meals. ? Eating meals during the 2-3 hours before bed.  Cook foods using methods other than frying. This may  include baking, grilling, or broiling. Lifestyle   Maintain a healthy weight. Ask your health care provider what weight is healthy for you. If you need to lose weight, work with your health care provider to do so safely.  Exercise for at least 30 minutes on 5 or more days each week, or as told by your health care provider.  Avoid wearing clothes that fit tightly around your waist and chest.  Do not use any products that contain nicotine or tobacco, such as cigarettes and e-cigarettes. If you need help quitting, ask your health care provider.  Sleep with the head of your bed raised. Use a wedge under the mattress or blocks under the bed frame to raise the head of the bed. What foods are not recommended? The items listed may not be a complete list. Talk with your dietitian about what dietary choices are best for you. Grains Pastries or quick breads with added fat. [place] toast. Vegetables Deep fried vegetables. [place] fries. Any vegetables prepared with added fat. Any vegetables that cause symptoms. For some people this may include tomatoes and tomato products, chili peppers, onions and garlic, and horseradish. Fruits Any fruits prepared with added fat. Any fruits that cause symptoms. For some people this may include citrus fruits, such as oranges, grapefruit, pineapple, and lemons. Meats and other protein foods High-fat meats, such as fatty beef or pork, hot dogs, ribs, ham, sausage, salami and bacon. Fried meat or protein, including fried fish and fried chicken. Nuts and nut butters. Dairy Whole milk and chocolate milk. Sour cream. Cream. Ice cream. Cream cheese. Milk shakes. Beverages Coffee and tea, with or without caffeine. Carbonated beverages. Sodas. Energy drinks. Fruit juice made with acidic fruits (such as orange or grapefruit). Tomato juice. Alcoholic drinks. Fats and oils Butter. Margarine. Shortening. Ghee. Sweets and desserts Chocolate and cocoa. Donuts. Seasoning and other  foods Pepper. Peppermint and spearmint. Any condiments, herbs, or seasonings that cause symptoms. For some people, this may include curry, hot sauce, or vinegar-based salad dressings. Summary  When you have gastroesophageal reflux disease (GERD), food and lifestyle choices are very important to help ease the discomfort of GERD.  Eat frequent, small meals instead of three large meals each day. Eat your meals slowly, in a relaxed setting. Avoid bending over or lying down until 2-3 hours after eating.  Limit high-fat foods such as fatty meat or fried foods. This information is not intended to replace advice given to you by your health care provider. Make sure you discuss any questions you have with your health care provider. Document Released: 08/29/2005 Document Revised: 08/30/2016 Document Reviewed: 08/30/2016 Elsevier Interactive Patient Education  2017 Elsevier Inc.   Abdominal Pain, Adult Abdominal pain can be caused by many things. Often, abdominal pain is not serious and it gets better with no treatment or by being treated at home. However, sometimes abdominal pain is serious. Your health care provider will do a medical history and a physical exam to try to determine the cause of your abdominal pain. Follow these instructions at home:  Take over-the-counter and prescription medicines only as told by your health care provider. Do not take a laxative unless told by your health care provider.  Drink enough fluid to  keep your urine clear or pale yellow.  Watch your condition for any changes.  Keep all follow-up visits as told by your health care provider. This is important. Contact a health care provider if:  Your abdominal pain changes or gets worse.  You are not hungry or you lose weight without trying.  You are constipated or have diarrhea for more than 2-3 days.  You have pain when you urinate or have a bowel movement.  Your abdominal pain wakes you up at night.  Your pain  gets worse with meals, after eating, or with certain foods.  You are throwing up and cannot keep anything down.  You have a fever. Get help right away if:  Your pain does not go away as soon as your health care provider told you to expect.  You cannot stop throwing up.  Your pain is only in areas of the abdomen, such as the right side or the left lower portion of the abdomen.  You have bloody or black stools, or stools that look like tar.  You have severe pain, cramping, or bloating in your abdomen.  You have signs of dehydration, such as: ? Dark urine, very little urine, or no urine. ? Cracked lips. ? Dry mouth. ? Sunken eyes. ? Sleepiness. ? Weakness. This information is not intended to replace advice given to you by your health care provider. Make sure you discuss any questions you have with your health care provider. Document Released: 06/08/2005 Document Revised: 03/18/2016 Document Reviewed: 02/10/2016 Elsevier Interactive Patient Education  2017 Elsevier Inc.  Nonspecific Chest Pain Chest pain can be caused by many different conditions. There is always a chance that your pain could be related to something serious, such as a heart attack or a blood clot in your lungs. Chest pain can also be caused by conditions that are not life-threatening. If you have chest pain, it is very important to follow up with your health care provider. What are the causes? Causes of this condition include:  Heartburn.  Pneumonia or bronchitis.  Anxiety or stress.  Inflammation around your heart (pericarditis) or lung (pleuritis or pleurisy).  A blood clot in your lung.  A collapsed lung (pneumothorax). This can develop suddenly on its own (spontaneous pneumothorax) or from trauma to the chest.  Shingles infection (varicella-zoster virus).  Heart attack.  Damage to the bones, muscles, and cartilage that make up your chest wall. This can include: ? Bruised bones due to  injury. ? Strained muscles or cartilage due to frequent or repeated coughing or overwork. ? Fracture to one or more ribs. ? Sore cartilage due to inflammation (costochondritis).  What increases the risk? Risk factors for this condition may include:  Activities that increase your risk for trauma or injury to your chest.  Respiratory infections or conditions that cause frequent coughing.  Medical conditions or overeating that can cause heartburn.  Heart disease or family history of heart disease.  Conditions or health behaviors that increase your risk of developing a blood clot.  Having had chicken pox (varicella zoster).  What are the signs or symptoms? Chest pain can feel like:  Burning or tingling on the surface of your chest or deep in your chest.  Crushing, pressure, aching, or squeezing pain.  Dull or sharp pain that is worse when you move, cough, or take a deep breath.  Pain that is also felt in your back, neck, shoulder, or arm, or pain that spreads to any of these areas.  Your  chest pain may come and go, or it may stay constant. How is this diagnosed? Lab tests or other studies may be needed to find the cause of your pain. Your health care provider may have you take a test called an ECG (electrocardiogram). An ECG records your heartbeat patterns at the time the test is performed. You may also have other tests, such as:  Transthoracic echocardiogram (TTE). In this test, sound waves are used to create a picture of the heart structures and to look at how blood flows through your heart.  Transesophageal echocardiogram (TEE).This is a more advanced imaging test that takes images from inside your body. It allows your health care provider to see your heart in finer detail.  Cardiac monitoring. This allows your health care provider to monitor your heart rate and rhythm in real time.  Holter monitor. This is a portable device that records your heartbeat and can help to  diagnose abnormal heartbeats. It allows your health care provider to track your heart activity for several days, if needed.  Stress tests. These can be done through exercise or by taking medicine that makes your heart beat more quickly.  Blood tests.  Other imaging tests.  How is this treated? Treatment depends on what is causing your chest pain. Treatment may include:  Medicines. These may include: ? Acid blockers for heartburn. ? Anti-inflammatory medicine. ? Pain medicine for inflammatory conditions. ? Antibiotic medicine, if an infection is present. ? Medicines to dissolve blood clots. ? Medicines to treat coronary artery disease (CAD).  Supportive care for conditions that do not require medicines. This may include: ? Resting. ? Applying heat or cold packs to injured areas. ? Limiting activities until pain decreases.  Follow these instructions at home: Medicines  If you were prescribed an antibiotic, take it as told by your health care provider. Do not stop taking the antibiotic even if you start to feel better.  Take over-the-counter and prescription medicines only as told by your health care provider. Lifestyle  Do not use any products that contain nicotine or tobacco, such as cigarettes and e-cigarettes. If you need help quitting, ask your health care provider.  Do not drink alcohol.  Make lifestyle changes as directed by your health care provider. These may include: ? Getting regular exercise. Ask your health care provider to suggest some activities that are safe for you. ? Eating a heart-healthy diet. A registered dietitian can help you to learn healthy eating options. ? Maintaining a healthy weight. ? Managing diabetes, if necessary. ? Reducing stress, such as with yoga or relaxation techniques. General instructions  Avoid any activities that bring on chest pain.  If heartburn is the cause for your chest pain, raise (elevate) the head of your bed about 6 inches  (15 cm) by putting blocks under the legs. Sleeping with more pillows does not effectively relieve heartburn because it only changes the position of your head.  Keep all follow-up visits as told by your health care provider. This is important. This includes any further testing if your chest pain does not go away. Contact a health care provider if:  Your chest pain does not go away.  You have a rash with blisters on your chest.  You have a fever.  You have chills. Get help right away if:  Your chest pain is worse.  You have a cough that gets worse, or you cough up blood.  You have severe pain in your abdomen.  You have severe weakness.  You faint.  You have sudden, unexplained chest discomfort.  You have sudden, unexplained discomfort in your arms, back, neck, or jaw.  You have shortness of breath at any time.  You suddenly start to sweat, or your skin gets clammy.  You feel nauseous or you vomit.  You suddenly feel light-headed or dizzy.  Your heart begins to beat quickly, or it feels like it is skipping beats. These symptoms may represent a serious problem that is an emergency. Do not wait to see if the symptoms will go away. Get medical help right away. Call your local emergency services (911 in the [place] Do not drive yourself to the hospital. This information is not intended to replace advice given to you by your health care provider. Make sure you discuss any questions you have with your health care provider. Document Released: 06/08/2005 Document Revised: 05/23/2016 Document Reviewed: 05/23/2016 Elsevier Interactive Patient Education  2017 [employer]   Cough, Adult Coughing is a reflex that clears your throat and your airways. Coughing helps to heal and protect your lungs. It is normal to cough occasionally, but a cough that happens with other symptoms or lasts a long time may be a sign of a condition that needs treatment. A cough may last only 2-3 weeks (acute),  or it may last longer than 8 weeks (chronic). What are the causes? Coughing is commonly caused by:  Breathing in substances that irritate your lungs.  A viral or bacterial respiratory infection.  Allergies.  Asthma.  Postnasal drip.  Smoking.  Acid backing up from the stomach into the esophagus (gastroesophageal reflux).  Certain medicines.  Chronic lung problems, including COPD (or rarely, lung cancer).  Other medical conditions such as heart failure.  Follow these instructions at home: Pay attention to any changes in your symptoms. Take these actions to help with your discomfort:  Take medicines only as told by your health care provider. ? If you were prescribed an antibiotic medicine, take it as told by your health care provider. Do not stop taking the antibiotic even if you start to feel better. ? Talk with your health care provider before you take a cough suppressant medicine.  Drink enough fluid to keep your urine clear or pale yellow.  If the air is dry, use a cold steam vaporizer or humidifier in your bedroom or your home to help loosen secretions.  Avoid anything that causes you to cough at work or at home.  If your cough is worse at night, try sleeping in a semi-upright position.  Avoid cigarette smoke. If you smoke, quit smoking. If you need help quitting, ask your health care provider.  Avoid caffeine.  Avoid alcohol.  Rest as needed.  Contact a health care provider if:  You have new symptoms.  You cough up pus.  Your cough does not get better after 2-3 weeks, or your cough gets worse.  You cannot control your cough with suppressant medicines and you are losing sleep.  You develop pain that is getting worse or pain that is not controlled with pain medicines.  You have a fever.  You have unexplained weight loss.  You have night sweats. Get help right away if:  You cough up blood.  You have difficulty breathing.  Your heartbeat is very  fast. This information is not intended to replace advice given to you by your health care provider. Make sure you discuss any questions you have with your health care provider. Document Released: 02/25/2011 Document Revised: 02/04/2016  Document Reviewed: 11/05/2014 Elsevier Interactive Patient Education  2017 [place] and Stress Management Stress is a normal reaction to life events. It is what you feel when life demands more than you are used to or more than you can handle. Some stress can be useful. For example, the stress reaction can help you catch the last bus of the day, study for a test, or meet a deadline at work. But stress that occurs too often or for too long can cause problems. It can affect your emotional health and interfere with relationships and normal daily activities. Too much stress can weaken your immune system and increase your risk for physical illness. If you already have a medical problem, stress can make it worse. What are the causes? All sorts of life events may cause stress. An event that causes stress for one person may not be stressful for another person. Major life events commonly cause stress. These may be positive or negative. Examples include losing your job, moving into a new home, getting married, having a baby, or losing a loved one. Less obvious life events may also cause stress, especially if they occur day after day or in combination. Examples include working long hours, driving in traffic, caring for children, being in debt, or being in a difficult relationship. What are the signs or symptoms? Stress may cause emotional symptoms including, the following:  Anxiety. This is feeling worried, afraid, on edge, overwhelmed, or out of control.  Anger. This is feeling irritated or impatient.  Depression. This is feeling sad, down, helpless, or guilty.  Difficulty focusing, remembering, or making decisions.  Stress may cause physical symptoms,  including the following:  Aches and pains. These may affect your head, neck, back, stomach, or other areas of your body.  Tight muscles or clenched jaw.  Low energy or trouble sleeping.  Stress may cause unhealthy behaviors, including the following:  Eating to feel better (overeating) or skipping meals.  Sleeping too little, too much, or both.  Working too much or putting off tasks (procrastination).  Smoking, drinking alcohol, or using drugs to feel better.  How is this diagnosed? Stress is diagnosed through an assessment by your health care provider. Your health care provider will ask questions about your symptoms and any stressful life events.Your health care provider will also ask about your medical history and may order blood tests or other tests. Certain medical conditions and medicine can cause physical symptoms similar to stress. Mental illness can cause emotional symptoms and unhealthy behaviors similar to stress. Your health care provider may refer you to a mental health professional for further evaluation. How is this treated? Stress management is the recommended treatment for stress.The goals of stress management are reducing stressful life events and coping with stress in healthy ways. Techniques for reducing stressful life events include the following:  Stress identification. Self-monitor for stress and identify what causes stress for you. These skills may help you to avoid some stressful events.  Time management. Set your priorities, keep a calendar of events, and learn to say "no." These tools can help you avoid making too many commitments.  Techniques for coping with stress include the following:  Rethinking the problem. Try to think realistically about stressful events rather than ignoring them or overreacting. Try to find the positives in a stressful situation rather than focusing on the negatives.  Exercise. Physical exercise can release both physical and  emotional tension. The key is to find a form of  exercise you enjoy and do it regularly.  Relaxation techniques. These relax the body and mind. Examples include yoga, meditation, tai chi, biofeedback, deep breathing, progressive muscle relaxation, listening to music, being out in nature, journaling, and other hobbies. Again, the key is to find one or more that you enjoy and can do regularly.  Healthy lifestyle. Eat a balanced diet, get plenty of sleep, and do not smoke. Avoid using alcohol or drugs to relax.  Strong support network. Spend time with family, friends, or other people you enjoy being around.Express your feelings and talk things over with someone you trust.  Counseling or talktherapy with a mental health professional may be helpful if you are having difficulty managing stress on your own. Medicine is typically not recommended for the treatment of stress.Talk to your health care provider if you think you need medicine for symptoms of stress. Follow these instructions at home:  Keep all follow-up visits as directed by your health care provider.  Take all medicines as directed by your health care provider. Contact a health care provider if:  Your symptoms get worse or you start having new symptoms.  You feel overwhelmed by your problems and can no longer manage them on your own. Get help right away if:  You feel like hurting yourself or someone else. This information is not intended to replace advice given to you by your health care provider. Make sure you discuss any questions you have with your health care provider. Document Released: 02/22/2001 Document Revised: 02/04/2016 Document Reviewed: 04/23/2013 Elsevier Interactive Patient Education  2017 [employer].    IF you received an x-ray today, you will receive an invoice from [facility]. Please contact [facility] at [phone] with questions or concerns regarding your invoice.   IF you received  labwork today, you will receive an invoice from [facility]. Please contact [facility] at [phone] with questions or concerns regarding your invoice.   Our billing staff will not be able to assist you with questions regarding bills from these companies.  You will be contacted with the lab results as soon as they are available. The fastest way to get your results is to activate your My Chart account. Instructions are located on the last page of this paperwork. If you have not heard from [place] regarding the results in 2 weeks, please contact this office.       I personally performed the services described in this documentation, which was scribed in my presence. The recorded information has been reviewed and considered for accuracy and completeness, addended by me as needed, and agree with information above.  Signed,   Merri Ray, MD [facility] [place].  03/24/17 2:29 PM

## 2017-03-23 NOTE — Patient Instructions (Addendum)
Continue to cut back on alcohol as that can affect blood pressure. Cut back on sodium/salt in the diet including restaurant and frozen foods. Keep a record of your blood pressures outside of the office. If it remains over 140/90 in upcoming weeks - then return for med changes.   Restart Lipitor, then return for lab only visit in next 6 weeks.   If you are feeling more anxiety or depression symptoms, I would recommend restarting zoloft.   Continue nexium everyday, try zyrtec, allegra, or claritin once per day as a trial to see if there is an allergic part to your cough. I will check xray and will refer you to Ear nose and throat for the hoarseness. Albuterol if needed for cough, or wheeze.  If you do require albuterol daily, then we need to look into a steroid inhaler again.   I will check pancreas, liver and other blood tests for abdominal pain. Return to the clinic or go to the nearest emergency room if any of your symptoms worsen or new symptoms occur.  Recheck for cough and other symptoms in next 2 weeks - Return to the clinic or go to the nearest emergency room if any of your symptoms worsen or new symptoms occur.  Avoid foods known to worsen heartburn:   Food Choices for Gastroesophageal Reflux Disease, Adult When you have gastroesophageal reflux disease (GERD), the foods you eat and your eating habits are very important. Choosing the right foods can help ease the discomfort of GERD. Consider working with a diet and nutrition specialist (dietitian) to help you make healthy food choices. What general guidelines should I follow? Eating plan  Choose healthy foods low in fat, such as fruits, vegetables, whole grains, low-fat dairy products, and lean meat, fish, and poultry.  Eat frequent, small meals instead of three large meals each day. Eat your meals slowly, in a relaxed setting. Avoid bending over or lying down until 2-3 hours after eating.  Limit high-fat foods such as fatty meats or  fried foods.  Limit your intake of oils, butter, and shortening to less than 8 teaspoons each day.  Avoid the following: ? Foods that cause symptoms. These may be different for different people. Keep a food diary to keep track of foods that cause symptoms. ? Alcohol. ? Drinking large amounts of liquid with meals. ? Eating meals during the 2-3 hours before bed.  Cook foods using methods other than frying. This may include baking, grilling, or broiling. Lifestyle   Maintain a healthy weight. Ask your health care provider what weight is healthy for you. If you need to lose weight, work with your health care provider to do so safely.  Exercise for at least 30 minutes on 5 or more days each week, or as told by your health care provider.  Avoid wearing clothes that fit tightly around your waist and chest.  Do not use any products that contain nicotine or tobacco, such as cigarettes and e-cigarettes. If you need help quitting, ask your health care provider.  Sleep with the head of your bed raised. Use a wedge under the mattress or blocks under the bed frame to raise the head of the bed. What foods are not recommended? The items listed may not be a complete list. Talk with your dietitian about what dietary choices are best for you. Grains Pastries or quick breads with added fat. [place] toast. Vegetables Deep fried vegetables. [place] fries. Any vegetables prepared with added fat. Any vegetables that cause  symptoms. For some people this may include tomatoes and tomato products, chili peppers, onions and garlic, and horseradish. Fruits Any fruits prepared with added fat. Any fruits that cause symptoms. For some people this may include citrus fruits, such as oranges, grapefruit, pineapple, and lemons. Meats and other protein foods High-fat meats, such as fatty beef or pork, hot dogs, ribs, ham, sausage, salami and bacon. Fried meat or protein, including fried fish and fried chicken. Nuts and nut  butters. Dairy Whole milk and chocolate milk. Sour cream. Cream. Ice cream. Cream cheese. Milk shakes. Beverages Coffee and tea, with or without caffeine. Carbonated beverages. Sodas. Energy drinks. Fruit juice made with acidic fruits (such as orange or grapefruit). Tomato juice. Alcoholic drinks. Fats and oils Butter. Margarine. Shortening. Ghee. Sweets and desserts Chocolate and cocoa. Donuts. Seasoning and other foods Pepper. Peppermint and spearmint. Any condiments, herbs, or seasonings that cause symptoms. For some people, this may include curry, hot sauce, or vinegar-based salad dressings. Summary  When you have gastroesophageal reflux disease (GERD), food and lifestyle choices are very important to help ease the discomfort of GERD.  Eat frequent, small meals instead of three large meals each day. Eat your meals slowly, in a relaxed setting. Avoid bending over or lying down until 2-3 hours after eating.  Limit high-fat foods such as fatty meat or fried foods. This information is not intended to replace advice given to you by your health care provider. Make sure you discuss any questions you have with your health care provider. Document Released: 08/29/2005 Document Revised: 08/30/2016 Document Reviewed: 08/30/2016 Elsevier Interactive Patient Education  2017 [employer]   Abdominal Pain, Adult Abdominal pain can be caused by many things. Often, abdominal pain is not serious and it gets better with no treatment or by being treated at home. However, sometimes abdominal pain is serious. Your health care provider will do a medical history and a physical exam to try to determine the cause of your abdominal pain. Follow these instructions at home:  Take over-the-counter and prescription medicines only as told by your health care provider. Do not take a laxative unless told by your health care provider.  Drink enough fluid to keep your urine clear or pale yellow.  Watch your  condition for any changes.  Keep all follow-up visits as told by your health care provider. This is important. Contact a health care provider if:  Your abdominal pain changes or gets worse.  You are not hungry or you lose weight without trying.  You are constipated or have diarrhea for more than 2-3 days.  You have pain when you urinate or have a bowel movement.  Your abdominal pain wakes you up at night.  Your pain gets worse with meals, after eating, or with certain foods.  You are throwing up and cannot keep anything down.  You have a fever. Get help right away if:  Your pain does not go away as soon as your health care provider told you to expect.  You cannot stop throwing up.  Your pain is only in areas of the abdomen, such as the right side or the left lower portion of the abdomen.  You have bloody or black stools, or stools that look like tar.  You have severe pain, cramping, or bloating in your abdomen.  You have signs of dehydration, such as: ? Dark urine, very little urine, or no urine. ? Cracked lips. ? Dry mouth. ? Sunken eyes. ? Sleepiness. ? Weakness. This information is  not intended to replace advice given to you by your health care provider. Make sure you discuss any questions you have with your health care provider. Document Released: 06/08/2005 Document Revised: 03/18/2016 Document Reviewed: 02/10/2016 Elsevier Interactive Patient Education  2017 [employer]  Nonspecific Chest Pain Chest pain can be caused by many different conditions. There is always a chance that your pain could be related to something serious, such as a heart attack or a blood clot in your lungs. Chest pain can also be caused by conditions that are not life-threatening. If you have chest pain, it is very important to follow up with your health care provider. What are the causes? Causes of this condition include:  Heartburn.  Pneumonia or bronchitis.  Anxiety or  stress.  Inflammation around your heart (pericarditis) or lung (pleuritis or pleurisy).  A blood clot in your lung.  A collapsed lung (pneumothorax). This can develop suddenly on its own (spontaneous pneumothorax) or from trauma to the chest.  Shingles infection (varicella-zoster virus).  Heart attack.  Damage to the bones, muscles, and cartilage that make up your chest wall. This can include: ? Bruised bones due to injury. ? Strained muscles or cartilage due to frequent or repeated coughing or overwork. ? Fracture to one or more ribs. ? Sore cartilage due to inflammation (costochondritis).  What increases the risk? Risk factors for this condition may include:  Activities that increase your risk for trauma or injury to your chest.  Respiratory infections or conditions that cause frequent coughing.  Medical conditions or overeating that can cause heartburn.  Heart disease or family history of heart disease.  Conditions or health behaviors that increase your risk of developing a blood clot.  Having had chicken pox (varicella zoster).  What are the signs or symptoms? Chest pain can feel like:  Burning or tingling on the surface of your chest or deep in your chest.  Crushing, pressure, aching, or squeezing pain.  Dull or sharp pain that is worse when you move, cough, or take a deep breath.  Pain that is also felt in your back, neck, shoulder, or arm, or pain that spreads to any of these areas.  Your chest pain may come and go, or it may stay constant. How is this diagnosed? Lab tests or other studies may be needed to find the cause of your pain. Your health care provider may have you take a test called an ECG (electrocardiogram). An ECG records your heartbeat patterns at the time the test is performed. You may also have other tests, such as:  Transthoracic echocardiogram (TTE). In this test, sound waves are used to create a picture of the heart structures and to look at  how blood flows through your heart.  Transesophageal echocardiogram (TEE).This is a more advanced imaging test that takes images from inside your body. It allows your health care provider to see your heart in finer detail.  Cardiac monitoring. This allows your health care provider to monitor your heart rate and rhythm in real time.  Holter monitor. This is a portable device that records your heartbeat and can help to diagnose abnormal heartbeats. It allows your health care provider to track your heart activity for several days, if needed.  Stress tests. These can be done through exercise or by taking medicine that makes your heart beat more quickly.  Blood tests.  Other imaging tests.  How is this treated? Treatment depends on what is causing your chest pain. Treatment may include:  Medicines.  These may include: ? Acid blockers for heartburn. ? Anti-inflammatory medicine. ? Pain medicine for inflammatory conditions. ? Antibiotic medicine, if an infection is present. ? Medicines to dissolve blood clots. ? Medicines to treat coronary artery disease (CAD).  Supportive care for conditions that do not require medicines. This may include: ? Resting. ? Applying heat or cold packs to injured areas. ? Limiting activities until pain decreases.  Follow these instructions at home: Medicines  If you were prescribed an antibiotic, take it as told by your health care provider. Do not stop taking the antibiotic even if you start to feel better.  Take over-the-counter and prescription medicines only as told by your health care provider. Lifestyle  Do not use any products that contain nicotine or tobacco, such as cigarettes and e-cigarettes. If you need help quitting, ask your health care provider.  Do not drink alcohol.  Make lifestyle changes as directed by your health care provider. These may include: ? Getting regular exercise. Ask your health care provider to suggest some activities  that are safe for you. ? Eating a heart-healthy diet. A registered dietitian can help you to learn healthy eating options. ? Maintaining a healthy weight. ? Managing diabetes, if necessary. ? Reducing stress, such as with yoga or relaxation techniques. General instructions  Avoid any activities that bring on chest pain.  If heartburn is the cause for your chest pain, raise (elevate) the head of your bed about 6 inches (15 cm) by putting blocks under the legs. Sleeping with more pillows does not effectively relieve heartburn because it only changes the position of your head.  Keep all follow-up visits as told by your health care provider. This is important. This includes any further testing if your chest pain does not go away. Contact a health care provider if:  Your chest pain does not go away.  You have a rash with blisters on your chest.  You have a fever.  You have chills. Get help right away if:  Your chest pain is worse.  You have a cough that gets worse, or you cough up blood.  You have severe pain in your abdomen.  You have severe weakness.  You faint.  You have sudden, unexplained chest discomfort.  You have sudden, unexplained discomfort in your arms, back, neck, or jaw.  You have shortness of breath at any time.  You suddenly start to sweat, or your skin gets clammy.  You feel nauseous or you vomit.  You suddenly feel light-headed or dizzy.  Your heart begins to beat quickly, or it feels like it is skipping beats. These symptoms may represent a serious problem that is an emergency. Do not wait to see if the symptoms will go away. Get medical help right away. Call your local emergency services (911 in the [place] Do not drive yourself to the hospital. This information is not intended to replace advice given to you by your health care provider. Make sure you discuss any questions you have with your health care provider. Document Released: 06/08/2005 Document  Revised: 05/23/2016 Document Reviewed: 05/23/2016 Elsevier Interactive Patient Education  2017 [employer] Inc[employer]   Cough, Adult Coughing is a reflex that clears your throat and your airways. Coughing helps to heal and protect your lungs. It is normal to cough occasionally, but a cough that happens with other symptoms or lasts a long time may be a sign of a condition that needs treatment. A cough may last only 2-3 weeks (acute), or it may  last longer than 8 weeks (chronic). What are the causes? Coughing is commonly caused by:  Breathing in substances that irritate your lungs.  A viral or bacterial respiratory infection.  Allergies.  Asthma.  Postnasal drip.  Smoking.  Acid backing up from the stomach into the esophagus (gastroesophageal reflux).  Certain medicines.  Chronic lung problems, including COPD (or rarely, lung cancer).  Other medical conditions such as heart failure.  Follow these instructions at home: Pay attention to any changes in your symptoms. Take these actions to help with your discomfort:  Take medicines only as told by your health care provider. ? If you were prescribed an antibiotic medicine, take it as told by your health care provider. Do not stop taking the antibiotic even if you start to feel better. ? Talk with your health care provider before you take a cough suppressant medicine.  Drink enough fluid to keep your urine clear or pale yellow.  If the air is dry, use a cold steam vaporizer or humidifier in your bedroom or your home to help loosen secretions.  Avoid anything that causes you to cough at work or at home.  If your cough is worse at night, try sleeping in a semi-upright position.  Avoid cigarette smoke. If you smoke, quit smoking. If you need help quitting, ask your health care provider.  Avoid caffeine.  Avoid alcohol.  Rest as needed.  Contact a health care provider if:  You have new symptoms.  You cough up pus.  Your cough  does not get better after 2-3 weeks, or your cough gets worse.  You cannot control your cough with suppressant medicines and you are losing sleep.  You develop pain that is getting worse or pain that is not controlled with pain medicines.  You have a fever.  You have unexplained weight loss.  You have night sweats. Get help right away if:  You cough up blood.  You have difficulty breathing.  Your heartbeat is very fast. This information is not intended to replace advice given to you by your health care provider. Make sure you discuss any questions you have with your health care provider. Document Released: 02/25/2011 Document Revised: 02/04/2016 Document Reviewed: 11/05/2014 Elsevier Interactive Patient Education  2017 [place] and Stress Management Stress is a normal reaction to life events. It is what you feel when life demands more than you are used to or more than you can handle. Some stress can be useful. For example, the stress reaction can help you catch the last bus of the day, study for a test, or meet a deadline at work. But stress that occurs too often or for too long can cause problems. It can affect your emotional health and interfere with relationships and normal daily activities. Too much stress can weaken your immune system and increase your risk for physical illness. If you already have a medical problem, stress can make it worse. What are the causes? All sorts of life events may cause stress. An event that causes stress for one person may not be stressful for another person. Major life events commonly cause stress. These may be positive or negative. Examples include losing your job, moving into a new home, getting married, having a baby, or losing a loved one. Less obvious life events may also cause stress, especially if they occur day after day or in combination. Examples include working long hours, driving in traffic, caring for children, being in debt,  or being  in a difficult relationship. What are the signs or symptoms? Stress may cause emotional symptoms including, the following:  Anxiety. This is feeling worried, afraid, on edge, overwhelmed, or out of control.  Anger. This is feeling irritated or impatient.  Depression. This is feeling sad, down, helpless, or guilty.  Difficulty focusing, remembering, or making decisions.  Stress may cause physical symptoms, including the following:  Aches and pains. These may affect your head, neck, back, stomach, or other areas of your body.  Tight muscles or clenched jaw.  Low energy or trouble sleeping.  Stress may cause unhealthy behaviors, including the following:  Eating to feel better (overeating) or skipping meals.  Sleeping too little, too much, or both.  Working too much or putting off tasks (procrastination).  Smoking, drinking alcohol, or using drugs to feel better.  How is this diagnosed? Stress is diagnosed through an assessment by your health care provider. Your health care provider will ask questions about your symptoms and any stressful life events.Your health care provider will also ask about your medical history and may order blood tests or other tests. Certain medical conditions and medicine can cause physical symptoms similar to stress. Mental illness can cause emotional symptoms and unhealthy behaviors similar to stress. Your health care provider may refer you to a mental health professional for further evaluation. How is this treated? Stress management is the recommended treatment for stress.The goals of stress management are reducing stressful life events and coping with stress in healthy ways. Techniques for reducing stressful life events include the following:  Stress identification. Self-monitor for stress and identify what causes stress for you. These skills may help you to avoid some stressful events.  Time management. Set your priorities, keep a calendar of  events, and learn to say "no." These tools can help you avoid making too many commitments.  Techniques for coping with stress include the following:  Rethinking the problem. Try to think realistically about stressful events rather than ignoring them or overreacting. Try to find the positives in a stressful situation rather than focusing on the negatives.  Exercise. Physical exercise can release both physical and emotional tension. The key is to find a form of exercise you enjoy and do it regularly.  Relaxation techniques. These relax the body and mind. Examples include yoga, meditation, tai chi, biofeedback, deep breathing, progressive muscle relaxation, listening to music, being out in nature, journaling, and other hobbies. Again, the key is to find one or more that you enjoy and can do regularly.  Healthy lifestyle. Eat a balanced diet, get plenty of sleep, and do not smoke. Avoid using alcohol or drugs to relax.  Strong support network. Spend time with family, friends, or other people you enjoy being around.Express your feelings and talk things over with someone you trust.  Counseling or talktherapy with a mental health professional may be helpful if you are having difficulty managing stress on your own. Medicine is typically not recommended for the treatment of stress.Talk to your health care provider if you think you need medicine for symptoms of stress. Follow these instructions at home:  Keep all follow-up visits as directed by your health care provider.  Take all medicines as directed by your health care provider. Contact a health care provider if:  Your symptoms get worse or you start having new symptoms.  You feel overwhelmed by your problems and can no longer manage them on your own. Get help right away if:  You feel like hurting yourself or someone  else. This information is not intended to replace advice given to you by your health care provider. Make sure you discuss any  questions you have with your health care provider. Document Released: 02/22/2001 Document Revised: 02/04/2016 Document Reviewed: 04/23/2013 Elsevier Interactive Patient Education  2017 [employer].    IF you received an x-ray today, you will receive an invoice from [facility]. Please contact [facility] at [phone] with questions or concerns regarding your invoice.   IF you received labwork today, you will receive an invoice from [place]. Please contact [facility] at [phone] with questions or concerns regarding your invoice.   Our billing staff will not be able to assist you with questions regarding bills from these companies.  You will be contacted with the lab results as soon as they are available. The fastest way to get your results is to activate your My Chart account. Instructions are located on the last page of this paperwork. If you have not heard from [place] regarding the results in 2 weeks, please contact this office.

## 2017-03-24 LAB — COMPREHENSIVE METABOLIC PANEL
ALK PHOS: 110 IU/L (ref 39–117)
ALT: 44 IU/L — ABNORMAL HIGH (ref 0–32)
AST: 37 IU/L (ref 0–40)
Albumin/Globulin Ratio: 1.7 (ref 1.2–2.2)
Albumin: 4.6 g/dL (ref 3.5–5.5)
BUN/Creatinine Ratio: 14 (ref 9–23)
BUN: 8 mg/dL (ref 6–24)
Bilirubin Total: 0.3 mg/dL (ref 0.0–1.2)
CHLORIDE: 94 mmol/L — AB (ref 96–106)
CO2: 22 mmol/L (ref 20–29)
CREATININE: 0.57 mg/dL (ref 0.57–1.00)
Calcium: 9.9 mg/dL (ref 8.7–10.2)
GFR calc Af Amer: 124 mL/min/{1.73_m2} (ref >59)
GFR calc non Af Amer: 108 mL/min/{1.73_m2} (ref >59)
GLOBULIN, TOTAL: 2.7 g/dL (ref 1.5–4.5)
GLUCOSE: 145 mg/dL — AB (ref 65–99)
Potassium: 4.1 mmol/L (ref 3.5–5.2)
SODIUM: 139 mmol/L (ref 134–144)
Total Protein: 7.3 g/dL (ref 6.0–8.5)

## 2017-03-24 LAB — LIPASE: Lipase: 27 U/L (ref 14–72)

## 2017-03-24 LAB — CBC
HEMATOCRIT: 42.1 % (ref 34.0–46.6)
HEMOGLOBIN: 14.2 g/dL (ref 11.1–15.9)
MCH: 30.9 pg (ref 26.6–33.0)
MCHC: 33.7 g/dL (ref 31.5–35.7)
MCV: 92 fL (ref 79–97)
Platelets: 275 10*3/uL (ref 150–379)
RBC: 4.59 x10E6/uL (ref 3.77–5.28)
RDW: 13 % (ref 12.3–15.4)
WBC: 8.2 10*3/uL (ref 3.4–10.8)

## 2017-04-06 ENCOUNTER — Encounter: Payer: Self-pay | Admitting: Family Medicine

## 2017-04-06 ENCOUNTER — Ambulatory Visit (INDEPENDENT_AMBULATORY_CARE_PROVIDER_SITE_OTHER): Payer: BLUE CROSS/BLUE SHIELD | Admitting: Family Medicine

## 2017-04-06 VITALS — BP 122/84 | HR 78 | Temp 97.6°F | Resp 16 | Ht 67.0 in | Wt 173.2 lb

## 2017-04-06 DIAGNOSIS — R140 Abdominal distension (gaseous): Secondary | ICD-10-CM | POA: Diagnosis not present

## 2017-04-06 DIAGNOSIS — R1031 Right lower quadrant pain: Secondary | ICD-10-CM

## 2017-04-06 DIAGNOSIS — R351 Nocturia: Secondary | ICD-10-CM | POA: Diagnosis not present

## 2017-04-06 DIAGNOSIS — Z1231 Encounter for screening mammogram for malignant neoplasm of breast: Secondary | ICD-10-CM | POA: Diagnosis not present

## 2017-04-06 DIAGNOSIS — R1084 Generalized abdominal pain: Secondary | ICD-10-CM

## 2017-04-06 DIAGNOSIS — R7989 Other specified abnormal findings of blood chemistry: Secondary | ICD-10-CM

## 2017-04-06 DIAGNOSIS — Z1239 Encounter for other screening for malignant neoplasm of breast: Secondary | ICD-10-CM

## 2017-04-06 DIAGNOSIS — R945 Abnormal results of liver function studies: Principal | ICD-10-CM

## 2017-04-06 DIAGNOSIS — R1011 Right upper quadrant pain: Secondary | ICD-10-CM | POA: Diagnosis not present

## 2017-04-06 DIAGNOSIS — F1010 Alcohol abuse, uncomplicated: Secondary | ICD-10-CM

## 2017-04-06 DIAGNOSIS — R059 Cough, unspecified: Secondary | ICD-10-CM

## 2017-04-06 DIAGNOSIS — R05 Cough: Secondary | ICD-10-CM | POA: Diagnosis not present

## 2017-04-06 LAB — POCT URINALYSIS DIP (MANUAL ENTRY)
BILIRUBIN UA: NEGATIVE
GLUCOSE UA: NEGATIVE mg/dL
Ketones, POC UA: NEGATIVE mg/dL
Leukocytes, UA: NEGATIVE
Nitrite, UA: NEGATIVE
Protein Ur, POC: NEGATIVE mg/dL
RBC UA: NEGATIVE
SPEC GRAV UA: 1.02 (ref 1.010–1.025)
UROBILINOGEN UA: 0.2 U/dL
pH, UA: 6 (ref 5.0–8.0)

## 2017-04-06 NOTE — Patient Instructions (Addendum)
I will refer you to gastroenterologist, check urine test and hepatitis testing, and check CT scan for your abdominal pain. Return to the clinic or go to the nearest emergency room if any of your symptoms worsen or new symptoms occur. Continue Nexium, continue to cut back alcohol, avoid spicy foods or other foods known to cause worsening abdominal pain.  Wear a dust mask and try to avoid dusty conditions as much as possible as that may be worsening her cough. Albuterol for now, but if you continue to require it daily, would look into other inhaled corticosteroid options that may be less costly. Flonase nasal spray in addition to the antihistamine such as Zyrtec, Claritin or Allegra can help with allergies.  Please follow-up to discuss back pain and hip pain further, otherwise follow-up with me in the next 1 month  Return to the clinic or go to the nearest emergency room if any of your symptoms worsen or new symptoms occur.   Abdominal Pain, Adult Abdominal pain can be caused by many things. Often, abdominal pain is not serious and it gets better with no treatment or by being treated at home. However, sometimes abdominal pain is serious. Your health care provider will do a medical history and a physical exam to try to determine the cause of your abdominal pain. Follow these instructions at home:  Take over-the-counter and prescription medicines only as told by your health care provider. Do not take a laxative unless told by your health care provider.  Drink enough fluid to keep your urine clear or pale yellow.  Watch your condition for any changes.  Keep all follow-up visits as told by your health care provider. This is important. Contact a health care provider if:  Your abdominal pain changes or gets worse.  You are not hungry or you lose weight without trying.  You are constipated or have diarrhea for more than 2-3 days.  You have pain when you urinate or have a bowel movement.  Your  abdominal pain wakes you up at night.  Your pain gets worse with meals, after eating, or with certain foods.  You are throwing up and cannot keep anything down.  You have a fever. Get help right away if:  Your pain does not go away as soon as your health care provider told you to expect.  You cannot stop throwing up.  Your pain is only in areas of the abdomen, such as the right side or the left lower portion of the abdomen.  You have bloody or black stools, or stools that look like tar.  You have severe pain, cramping, or bloating in your abdomen.  You have signs of dehydration, such as: ? Dark urine, very little urine, or no urine. ? Cracked lips. ? Dry mouth. ? Sunken eyes. ? Sleepiness. ? Weakness. This information is not intended to replace advice given to you by your health care provider. Make sure you discuss any questions you have with your health care provider. Document Released: 06/08/2005 Document Revised: 03/18/2016 Document Reviewed: 02/10/2016 Elsevier Interactive Patient Education  2017 [employer]    IF you received an x-ray today, you will receive an invoice from [facility]. Please contact [facility] at [phone] with questions or concerns regarding your invoice.   IF you received labwork today, you will receive an invoice from [facility]. Please contact [facility] at [phone] with questions or concerns regarding your invoice.   Our billing staff will not be able to assist you with questions  regarding bills from these companies.  You will be contacted with the lab results as soon as they are available. The fastest way to get your results is to activate your My Chart account. Instructions are located on the last page of this paperwork. If you have not heard from us regarding the results in 2 weeks, please contact this office.    We recommend that you schedule a mammogram for breast cancer screening. I have placed a referral

## 2017-04-06 NOTE — Progress Notes (Signed)
By signing my name below, I, [name], attest that this documentation has been prepared under the direction and in the presence of [name], MD.  Electronically Signed: [name], Medical Scribe. 04/06/17. 2:47 PM.  Subjective:    Patient ID: [name], female    DOB: Jun 28, 1966, 51 y.o.   MRN: [id number]  HPI Chief Complaint  Patient presents with  . Follow-up    2 weeks - lab results and per patient does not need refills    HPI Comments: [name] is a 51 y.o. female who presents to Primary Care at [facility] for DM follow-up. Multiple concerns were discussed July 12th.  Pt has been having abdominal, ongoing right flank, ongoing hip, and ongoing back pain. Pt recently injured her hip.  Pt goes into anaphylactic shock with contrast. The last time she used contrast she couldn't breath as soon as the contrast was used.  Abdominal Pain: She reports always having abdominal pain. She's had more swelling on her right flank with associated hardness for the past 7 months and occasionally it protrudes. There was a time she looked 7 months pregnant, but the time it expands it shrinks overnight. Pt also notes bloating and states she feels like she's suffocating. Pt's daughter states pt's abdominal pain has been there before pt started drinking heavily. GI is  Dr. Randa Evens. Pt can only eat 5 bites before she has to used the bathroom at times. Pt has been taking nexium daily. Pt has a hiatal hernia. Pt drinks about a pint of liquor/beer (mainly liquor) in the week, and in the last 2 weeks she's drank a pint of liquor and she chasses it with beer. Pt will drink 1-3 (mainly 3) shots and chase it with 3 beers every now and then. 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 She doesn't want to stop drinking alcohol. Denies belching, flatulence, nausea, peptic ulcer, PMHx of Hep, enlarged liver.  Pt has  nocturia 3x a night, every night for the last 20 years.  Nodule: Pt notes a growing nodule under her left breast for the last 3 years.   Temperature Intolerance: Pt has experienced hot and cold intolerance for the past 8 years, but she suspects her flushes are from menopause. She will go from being cold (causing body aches), and then to hot flashes, causing her to soak her sheets.  Seizure: Pt's last seizure has been over a year. Pt has annual visits at her neurologist.  Cancer Screening: Pt agrees to mammogram for breast CA screening.  Cough: Possible multifactorial when discussed last ov. Allergies, asthma, and possible reflux.   Unable to afford ICS for asthma. Recommended albuterol, OTC antihistamine, and avoidance of GERD triggers. Continued nexium, concider new ICS depending on albuterol need. She states her cough has gotten better and she's down from using albuterol BID to  QD, but she thinks her work place is triggering her allergies. Her cough is worse when she sweeps at night at her job. When pt was off from work, her cough was fine and improved, but when she was called into work, it flared her allergies. Pt is taking a antihistamine QD, nexium QD, but not a nasal spray. Denies melena, hematemesis, and vomiting with coughing.  HTN: Borderline at last visit, but had been coughing. Improved today. No meds were changed. Pt checks her bp at [employer] and it's been running from  [phone].  Hoarseness: Thought to be related to cough and reflux and referred to ENT with history of tobacco abuse.  Upper Abdominal Pain: See last visit. Thought to be upper abdominal wall/muscluar pain with coughing. Nl CMP, nl CBC, and nl lipase - except borderline single elevated LFT.   Lab Results  Component Value Date   HGBA1C 6.3 02/07/2017   Patient Active Problem List   Diagnosis Date Noted  . 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 Asthma   . Hyperlipemia   . Hypertension   . Seizures (HCC)    Past Surgical History:  Procedure Laterality Date  . ABDOMINAL HYSTERECTOMY    . CHOLECYSTECTOMY     Allergies  Allergen Reactions  . Contrast Media [Iodinated Diagnostic Agents] Anaphylaxis    Contrast Dye   . Codeine   . Iohexol      Desc: UNABLE TO BREATH   . Other Other (See Comments)    Cashews and pistachios; reaction unknown   Prior to Admission medications   Medication Sig Start Date End Date Taking? Authorizing Provider  albuterol (PROVENTIL HFA;VENTOLIN HFA) 108 (90 Base) MCG/ACT inhaler Inhale 2 puffs into the lungs every 6 (six) hours as needed for wheezing or shortness of breath. 03/23/17  Yes [name], MD  atorvastatin (LIPITOR) 10 MG tablet TAKE 1 TABLET BY MOUTH ONCE DAILY AT 6:00 PM 03/23/17  Yes [name], MD  esomeprazole (NEXIUM) 20 MG packet Take 20 mg by mouth daily before breakfast.   Yes [provider]  Fish Oil-Cholecalciferol (FISH OIL + D3) 1000-1000 MG-UNIT CAPS Take by mouth.   Yes [provider]  levETIRAcetam (KEPPRA) 500 MG tablet TAKE ONE TABLET BY MOUTH IN THE MORNING AND TWO TABLETS AT BEDTIME 08/01/16  Yes Nilda Riggs, NP  lisinopril-hydrochlorothiazide (PRINZIDE,ZESTORETIC) 20-25 MG tablet Take 1 tablet by mouth daily. 03/23/17  Yes [name], MD  Multiple Vitamin (MULTIVITAMIN) tablet Take 1 tablet by mouth daily.   Yes [provider]  beclomethasone (QVAR) 80 MCG/ACT inhaler Inhale 1 puff into the lungs 2 (two) times daily. Patient not taking: Reported on 04/06/2017 10/04/15   [name], MD  predniSONE (DELTASONE) 20 MG tablet 3 by mouth for 3 days, then 2 by mouth for 2 days, then 1 by mouth for 2 days, then 1/2 by mouth for 2 days. Patient not taking: Reported on 04/06/2017 02/07/17   [name], MD  sertraline (ZOLOFT) 50 MG tablet Take 1 tablet (50 mg total) by mouth  daily. Patient not taking: Reported on 04/06/2017 12/08/16   [name], MD   Social History   Social History  . Marital status: Widowed    Spouse name: N/A  . Number of children: 2  . Years of education: 12   Occupational History  . Not on file.   Social History Main Topics  . Smoking status: Former Games developer  . Smokeless tobacco: Never Used  . Alcohol use 1.2 oz/week    2 Cans of beer per week     Comment: beer, liquor  . Drug use: No  . Sexual activity: Yes    Birth control/ protection: Surgical   Other Topics Concern  . Not on file   Social History Narrative   Patient is widowed.   Patient has 2 children.   Patient is currently unemployed.   Patient has a high  school education.   Patient is right-handed.   Patient drinks 2 glass of caffeine daily ( soda and tea).   Review of Systems  Respiratory: Positive for cough.   Gastrointestinal: Positive for abdominal pain. Negative for blood in stool, nausea and vomiting.  Endocrine: Positive for cold intolerance and heat intolerance.  Genitourinary: Negative for hematuria.  Musculoskeletal: Positive for arthralgias and back pain.  Allergic/Immunologic: Positive for environmental allergies.  Neurological: Negative for seizures (controlled with medication).   Objective:  Physical Exam  Constitutional: She appears well-developed and well-nourished. No distress.  HENT:  Head: Normocephalic and atraumatic.  Eyes: Conjunctivae are normal.  Neck: Neck supple.  Cardiovascular: Normal rate, regular rhythm and normal heart sounds.  Exam reveals no gallop and no friction rub.   No murmur heard. Pulmonary/Chest: Effort normal and breath sounds normal. No respiratory distress. She has no wheezes. She has no rales.  Abdominal: Bowel sounds are normal. She exhibits no distension. There is tenderness in the right upper quadrant and right lower quadrant.  Soft tissue prominance over left rib margin  Neurological: She is alert.   Skin: Skin is warm and dry.  Psychiatric: She has a normal mood and affect. Her behavior is normal.  Nursing note and vitals reviewed.   Vitals:   04/06/17 1412 04/06/17 1428  BP: (!) 137/91 122/84  Pulse: 78   Resp: 16   Temp: 97.6 F (36.4 C)   TempSrc: Oral   SpO2: 98%   Weight: 173 lb 3.2 oz (78.6 kg)   Height: 5\' 7"  (1.702 m)   Body mass index is 27.13 kg/m.    Assessment & Plan:   Kelly GaudyDonna K Ortega is a 51 y.o. female Elevated LFTs , Generalized abdominal pain, Abdominal distensionRLQ abdominal pain, RUQ abdominal pain - Plan: Ambulatory referral to Gastroenterology, CT Abdomen Pelvis Wo Contrast  -reported distention and history of right upper quadrant abdominal pain. Mildly elevated LFTs previously, but significant alcohol use/abuse - not interested in cessation at this time. She has cut back and commended on such. Urinalysis reassuring and prior reassuring CBC.   -Continue to cut back on alcohol, check CT abdomen pelvis, acute hepatitis panel, and refer to her gastroenterologist for further evaluation and workup.  Screening for breast cancer - Plan: [facility] Digital Diagnostic Bilat - order placed  Cough  -Improved. Suspected component of asthma and allergies, along with dust exposure as it appears to worsen at work.   - Less sedating/nonsedating antihistamine, steroid nasal spray, dust mask while at work, continue albuterol as needed. If persistent frequent use, would look into inhaled corticosteroid options that may be more cost effective.   Alcohol abuse  - Commended on cutting back, but still discussed potential risks to health including her abdominal pain with any alcohol use. Offered support if she has difficulty cutting back.  - We also discussed treating anxiety/depression, but prefers not to take SSRI at this point. We did discuss that she is likely self treating with alcohol and would prefer safer approach is to those symptoms. She prefers not to change regimen at this  time.  - continue to cut back on alcohol.   Nocturia - Plan: POCT urinalysis dipstick, Urine Microscopic   -  Likely related to alcohol use. Reassuring in office urinalysis.    Reports long-standing hip pain, low back pain, no recent changes. Due to other issues addressed, requested she return to discuss the symptoms in more detail.   No orders of the defined types were placed in  this encounter.  Patient Instructions    I will refer you to gastroenterologist, check urine test and hepatitis testing, and check CT scan for your abdominal pain. Return to the clinic or go to the nearest emergency room if any of your symptoms worsen or new symptoms occur. Continue Nexium, continue to cut back alcohol, avoid spicy foods or other foods known to cause worsening abdominal pain.  Wear a dust mask and try to avoid dusty conditions as much as possible as that may be worsening her cough. Albuterol for now, but if you continue to require it daily, would look into other inhaled corticosteroid options that may be less costly. Flonase nasal spray in addition to the antihistamine such as Zyrtec, Claritin or Allegra can help with allergies.  Please follow-up to discuss back pain and hip pain further, otherwise follow-up with me in the next 1 month  Return to the clinic or go to the nearest emergency room if any of your symptoms worsen or new symptoms occur.   Abdominal Pain, Adult Abdominal pain can be caused by many things. Often, abdominal pain is not serious and it gets better with no treatment or by being treated at home. However, sometimes abdominal pain is serious. Your health care provider will do a medical history and a physical exam to try to determine the cause of your abdominal pain. Follow these instructions at home:  Take over-the-counter and prescription medicines only as told by your health care provider. Do not take a laxative unless told by your health care provider.  Drink enough fluid  to keep your urine clear or pale yellow.  Watch your condition for any changes.  Keep all follow-up visits as told by your health care provider. This is important. Contact a health care provider if:  Your abdominal pain changes or gets worse.  You are not hungry or you lose weight without trying.  You are constipated or have diarrhea for more than 2-3 days.  You have pain when you urinate or have a bowel movement.  Your abdominal pain wakes you up at night.  Your pain gets worse with meals, after eating, or with certain foods.  You are throwing up and cannot keep anything down.  You have a fever. Get help right away if:  Your pain does not go away as soon as your health care provider told you to expect.  You cannot stop throwing up.  Your pain is only in areas of the abdomen, such as the right side or the left lower portion of the abdomen.  You have bloody or black stools, or stools that look like tar.  You have severe pain, cramping, or bloating in your abdomen.  You have signs of dehydration, such as: ? Dark urine, very little urine, or no urine. ? Cracked lips. ? Dry mouth. ? Sunken eyes. ? Sleepiness. ? Weakness. This information is not intended to replace advice given to you by your health care provider. Make sure you discuss any questions you have with your health care provider. Document Released: 06/08/2005 Document Revised: 03/18/2016 Document Reviewed: 02/10/2016 Elsevier Interactive Patient Education  2017 [employer]    IF you received an x-ray today, you will receive an invoice from [facility]. Please contact [facility] at [phone] with questions or concerns regarding your invoice.   IF you received labwork today, you will receive an invoice from [facility]. Please contact [facility] at [phone] with questions or concerns regarding your invoice.   Our billing staff will  not be able to assist you with questions regarding bills  from these companies.  You will be contacted with the lab results as soon as they are available. The fastest way to get your results is to activate your My Chart account. Instructions are located on the last page of this paperwork. If you have not heard from [place] regarding the results in 2 weeks, please contact this office.    We recommend that you schedule a mammogram for breast cancer screening. I have placed a referral  I personally performed the services described in this documentation, which was scribed in my presence. The recorded information has been reviewed and considered for accuracy and completeness, addended by me as needed, and agree with information above.  Signed,   [name], MD Primary Care at [facility].  04/08/17 1:56 PM

## 2017-04-07 LAB — URINALYSIS, MICROSCOPIC ONLY: Casts: NONE SEEN /lpf

## 2017-04-07 LAB — HEPATITIS PANEL, ACUTE
HEP A IGM: NEGATIVE
HEP B C IGM: NEGATIVE
HEP B S AG: NEGATIVE

## 2017-04-12 ENCOUNTER — Telehealth: Payer: Self-pay | Admitting: Family Medicine

## 2017-04-12 DIAGNOSIS — R1084 Generalized abdominal pain: Secondary | ICD-10-CM

## 2017-04-12 DIAGNOSIS — R140 Abdominal distension (gaseous): Secondary | ICD-10-CM

## 2017-04-12 NOTE — Telephone Encounter (Signed)
Pt is scheduled for CT abdomen pelvis without contrast at [facility] but prior Berkley Harveyauth is under review. The case is to close on 04/14/17 but pt is scheduled to have scan on that day. [facility] can be contacted at [phone] if peer to peer wants to be done. No case number was provided.

## 2017-04-14 ENCOUNTER — Ambulatory Visit (INDEPENDENT_AMBULATORY_CARE_PROVIDER_SITE_OTHER): Payer: BLUE CROSS/BLUE SHIELD | Admitting: Physician Assistant

## 2017-04-14 ENCOUNTER — Encounter: Payer: Self-pay | Admitting: Physician Assistant

## 2017-04-14 ENCOUNTER — Other Ambulatory Visit: Payer: Self-pay

## 2017-04-14 VITALS — BP 144/93 | HR 83 | Temp 98.4°F | Resp 18 | Ht 67.32 in | Wt 175.4 lb

## 2017-04-14 DIAGNOSIS — M62838 Other muscle spasm: Secondary | ICD-10-CM

## 2017-04-14 DIAGNOSIS — M545 Low back pain, unspecified: Secondary | ICD-10-CM

## 2017-04-14 MED ORDER — NAPROXEN 500 MG PO TABS: 500.0000 mg | ORAL_TABLET | Freq: Two times a day (BID) | ORAL | 0 refills | Status: DC

## 2017-04-14 MED ORDER — CYCLOBENZAPRINE HCL 5 MG PO TABS: 5.0000 mg | ORAL_TABLET | Freq: Three times a day (TID) | ORAL | 0 refills | Status: DC | PRN

## 2017-04-14 NOTE — Progress Notes (Signed)
Kelly GaudyDonna K Tayloe  MRN: [id number] DOB: 03/27/1966  Subjective:   [name] is a 51 y.o. female who presents for evaluation of low back pain. The patient has had recurrent self limited episodes of low back pain in the past. Symptoms have been present for 1 days and are unchanged.  Onset was related to / precipitated by bending over and putting on underwear. The pain is located in the left lumbar area and radiates to the buttock and left lower leg. The pain is described as aching and sharp and occurs all day. She rates her pain as a 8 on a scale of 0-10. Symptoms are exacerbated by coughing, extension, flexion, lifting, standing and twisting. Symptoms are improved by NSAIDs. Denies acute injury, weakness in the left leg, hematuria, urinary hesitancy, urinary incontinence, urinary retention, bowel incontinence, constipation, impotence and groin/perineal numbness associated with the back pain. The patient has no "red flag" history indicative of complicated back pain.  Review of Systems Per HPI Patient Active Problem List   Diagnosis Date Noted  . 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 albuterol (PROVENTIL HFA;VENTOLIN HFA) 108 (90 Base) MCG/ACT inhaler Inhale 2 puffs into the lungs every 6 (six) hours as needed for wheezing or shortness of breath. 1 Inhaler 0  . atorvastatin (LIPITOR) 10 MG tablet TAKE 1 TABLET BY MOUTH ONCE DAILY AT 6:00 PM 90 tablet 1  . esomeprazole (NEXIUM) 20 MG packet Take 20 mg by mouth daily before breakfast.    . Fish Oil-Cholecalciferol (FISH OIL + D3) 1000-1000 MG-UNIT CAPS Take by mouth.    . levETIRAcetam (KEPPRA) 500 MG tablet TAKE ONE TABLET BY MOUTH IN THE MORNING AND TWO TABLETS AT BEDTIME 90 tablet 11  . lisinopril-hydrochlorothiazide (PRINZIDE,ZESTORETIC) 20-25 MG tablet Take 1 tablet by mouth daily. 90 tablet 1  .  Multiple Vitamin (MULTIVITAMIN) tablet Take 1 tablet by mouth daily.     No current facility-administered medications on file prior to visit.     Allergies  Allergen Reactions  . Contrast Media [Iodinated Diagnostic Agents] Anaphylaxis    Contrast Dye   . Codeine   . Iohexol      Desc: UNABLE TO BREATH   . Other Other (See Comments)    Cashews and pistachios; reaction unknown     Objective:  BP (!) 144/93 (BP Location: Right Arm, Patient Position: Standing, Cuff Size: Normal)   Pulse 83   Temp 98.4 F (36.9 C) (Oral)   Resp 18   Ht 5' 7.32" (1.71 m)   Wt 175 lb 6.4 oz (79.6 kg)   SpO2 98%   BMI 27.21 kg/m   Physical Exam  Constitutional: She is oriented to person, place, and time.  Well developed, well nourished female, standing up by exam table as she is having a difficult time finding a comfortable position.  HENT:  Head: Normocephalic and atraumatic.  Eyes: Conjunctivae are normal.  Neck: Normal range of motion.  Pulmonary/Chest: Effort normal.  Musculoskeletal:       Cervical back: Normal.       Lumbar back: She exhibits decreased range of motion (in fear of provoking spasm) and spasm (multiple in bilateral lumbar musculature, tenderness with palpation of left sided spasms). She exhibits no bony tenderness and no swelling.  Spasms palpated in left gluteal musculature.  Tightness of thoracic musculature noted.   Neurological:  She is alert and oriented to person, place, and time. She has normal sensation and normal strength. She has a normal Straight Leg Raise Test. Gait (due to fear of provoking spasm) abnormal.  Reflex Scores:      Patellar reflexes are 2+ on the right side and 2+ on the left side.      Achilles reflexes are 2+ on the right side and 2+ on the left side. Skin: Skin is warm and dry.  Psychiatric: Affect normal.  Vitals reviewed.   Assessment and Plan :   1. Acute left-sided low back pain without sciatica 2. Muscle spasm PE findings consistent  with moderate-severe muscle spasms in back muscles. Recommend heat, stretching, NSAIDs, and muscle relaxant. Given educational material for back stretches. Return to clinic if symptoms worsen, do not improve, or as needed - cyclobenzaprine (FLEXERIL) 5 MG tablet; Take 1 tablet (5 mg total) by mouth 3 (three) times daily as needed for muscle spasms.  Dispense: 60 tablet; Refill: 0 - naproxen (NAPROSYN) 500 MG tablet; Take 1 tablet (500 mg total) by mouth 2 (two) times daily with a meal.  Dispense: 30 tablet; Refill: 0  Benjiman CoreBrittany Wiseman, PA-C  Primary [facility] at [facility] 04/16/2017 6:37 PM

## 2017-04-14 NOTE — Telephone Encounter (Signed)
Pt scan canceled for today due to case still under review. [facility] said we can call them when Berkley Harveyauth is received to get pt back on schedule.

## 2017-04-14 NOTE — Patient Instructions (Addendum)
I recommend resting today. However, tomorrow I would begin walking and moving around as much as tolerated. Begin stretching in a couple of days. The worse thing you can do for low back pain is lie in bed all day or sit down all day. Use medications as needed.   Just to know, flexeril can cause side effects that may impair your thinking or reactions. Be careful if you drive or do anything that requires you to be awake and alert. void drinking alcohol, which can increase some of the side effects of Flexeril.  NSAIDs like naproxen have common side effects of heartburn, stomach pain, indigestion, and headache. Could lead to renal insufficiency, stroke, or GI bleed if taken excess amounts outside of what is recommended on label long term.    You should avoid heavy lifting or strenuous repetitive activity to prevent recurrence of event.  Use heat pad to affected area, do not apply directly to skin, use barrier such as towel over the skin. Leave on for 15-20 minutes, 3-4 times a day.  Please perform exercises below. Stretches are to be performed for 2 sets, holding 10-15 seconds each. Recommended to perform this rehab twice daily within pain tolerance for 2 weeks.   FLEXION RANGE OF MOTION AND STRETCHING EXERCISES: STRETCH - Flexion, Single Knee to Chest   Lie on a firm bed or floor with both legs extended in front of you.  Keeping one leg in contact with the floor, bring your opposite knee to your chest. Hold your leg in place by either grabbing behind your thigh or at your knee.  Pull until you feel a gentle stretch in your lower back.   Slowly release your grasp and repeat the exercise with the opposite side.  STRETCH - Flexion, Double Knee to Chest   Lie on a firm bed or floor with both legs extended in front of you.  Keeping one leg in contact with the floor, bring your opposite knee to your chest.  Tense your stomach muscles to support your back and then lift your other knee to your chest.  Hold your legs in place by either grabbing behind your thighs or at your knees.  Pull both knees toward your chest until you feel a gentle stretch in your lower back.   Tense your stomach muscles and slowly return one leg at a time to the floor.  STRETCH - Low Trunk Rotation  Lie on a firm bed or floor. Keeping your legs in front of you, bend your knees so they are both pointed toward the ceiling and your feet are flat on the floor.  Extend your arms out to the side. This will stabilize your upper body by keeping your shoulders in contact with the floor.  Gently and slowly drop both knees together to one side until you feel a gentle stretch in your lower back.   Tense your stomach muscles to support your lower back as you bring your knees back to the starting position. Repeat the exercise to the other side.   EXTENSION RANGE OF MOTION AND FLEXIBILITY EXERCISES: STRETCH - Extension, Prone on Elbows   Lie on your stomach on the floor, a bed will be too soft. Place your palms about shoulder width apart and at the height of your head.  Place your elbows under your shoulders. If this is too painful, stack pillows under your chest.  Allow your body to relax so that your hips drop lower and make contact more completely with the  floor.  Slowly return to lying flat on the floor.  RANGE OF MOTION - Extension, Prone Press Ups  Lie on your stomach on the floor, a bed will be too soft. Place your palms about shoulder width apart and at the height of your head.  Keeping your back as relaxed as possible, slowly straighten your elbows while keeping your hips on the floor. You may adjust the placement of your hands to maximize your comfort. As you gain motion, your hands will come more underneath your shoulders.  Slowly return to lying flat on the floor.  RANGE OF MOTION- Quadruped, Neutral Spine   Assume a hands and knees position on a firm surface. Keep your hands under your shoulders and your  knees under your hips. You may place padding under your knees for comfort.  Drop your head and point your tail bone toward the ground below you. This will round out your lower back like an angry cat.    Slowly lift your head and release your tail bone so that your back sags into a large arch, like an old horse.  Repeat this until you feel limber in your lower back.  Now, find your "sweet spot." This will be the most comfortable position somewhere between the two previous positions. This is your neutral spine. Once you have found this position, tense your stomach muscles to support your lower back.  STRENGTHENING EXERCISES - Low Back Strain These exercises may help you when beginning to rehabilitate your injury. These exercises should be done near your "sweet spot." This is the neutral, low-back arch, somewhere between fully rounded and fully arched, that is your least painful position. When performed in this safe range of motion, these exercises can be used for people who have either a flexion or extension based injury. These exercises may resolve your symptoms with or without further involvement from your physician, physical therapist or athletic trainer. While completing these exercises, remember:   Muscles can gain both the endurance and the strength needed for everyday activities through controlled exercises.  Complete these exercises as instructed by your physician, physical therapist or athletic trainer. Increase the resistance and repetitions only as guided.  You may experience muscle soreness or fatigue, but the pain or discomfort you are trying to eliminate should never worsen during these exercises. If this pain does worsen, stop and make certain you are following the directions exactly. If the pain is still present after adjustments, discontinue the exercise until you can discuss the trouble with your caregiver.  STRENGTHENING - Deep Abdominals, Pelvic Tilt  Lie on a firm bed or  floor. Keeping your legs in front of you, bend your knees so they are both pointed toward the ceiling and your feet are flat on the floor.  Tense your lower abdominal muscles to press your lower back into the floor. This motion will rotate your pelvis so that your tail bone is scooping upwards rather than pointing at your feet or into the floor.  STRENGTHENING - Abdominals, Crunches   Lie on a firm bed or floor. Keeping your legs in front of you, bend your knees so they are both pointed toward the ceiling and your feet are flat on the floor. Cross your arms over your chest.  Slightly tip your chin down without bending your neck.  Tense your abdominals and slowly lift your trunk high enough to just clear your shoulder blades. Lifting higher can put excessive stress on the lower back and does not further  strengthen your abdominal muscles.  Control your return to the starting position.  STRENGTHENING - Quadruped, Opposite UE/LE Lift   Assume a hands and knees position on a firm surface. Keep your hands under your shoulders and your knees under your hips. You may place padding under your knees for comfort.  Find your neutral spine and gently tense your abdominal muscles so that you can maintain this position. Your shoulders and hips should form a rectangle that is parallel with the floor and is not twisted.  Keeping your trunk steady, lift your right hand no higher than your shoulder and then your left leg no higher than your hip. Make sure you are not holding your breath.   Continuing to keep your abdominal muscles tense and your back steady, slowly return to your starting position. Repeat with the opposite arm and leg.  STRENGTHENING - Lower Abdominals, Double Knee Lift  Lie on a firm bed or floor. Keeping your legs in front of you, bend your knees so they are both pointed toward the ceiling and your feet are flat on the floor.  Tense your abdominal muscles to brace your lower back and  slowly lift both of your knees until they come over your hips. Be certain not to hold your breath.  POSTURE AND BODY MECHANICS CONSIDERATIONS - Low Back Strain Keeping correct posture when sitting, standing or completing your activities will reduce the stress put on different body tissues, allowing injured tissues a chance to heal and limiting painful experiences. The following are general guidelines for improved posture. Your physician or physical therapist will provide you with any instructions specific to your needs. While reading these guidelines, remember:  The exercises prescribed by your provider will help you have the flexibility and strength to maintain correct postures.  The correct posture provides the best environment for your joints to work. All of your joints have less wear and tear when properly supported by a spine with good posture. This means you will experience a healthier, less painful body.  Correct posture must be practiced with all of your activities, especially prolonged sitting and standing. Correct posture is as important when doing repetitive low-stress activities (typing) as it is when doing a single heavy-load activity (lifting). RESTING POSITIONS Consider which positions are most painful for you when choosing a resting position. If you have pain with flexion-based activities (sitting, bending, stooping, squatting), choose a position that allows you to rest in a less flexed posture. You would want to avoid curling into a fetal position on your side. If your pain worsens with extension-based activities (prolonged standing, working overhead), avoid resting in an extended position such as sleeping on your stomach. Most people will find more comfort when they rest with their spine in a more neutral position, neither too rounded nor too arched. Lying on a non-sagging bed on your side with a pillow between your knees, or on your back with a pillow under your knees will often provide  some relief. Keep in mind, being in any one position for a prolonged period of time, no matter how correct your posture, can still lead to stiffness. PROPER SITTING POSTURE In order to minimize stress and discomfort on your spine, you must sit with correct posture. Sitting with good posture should be effortless for a healthy body. Returning to good posture is a gradual process. Many people can work toward this most comfortably by using various supports until they have the flexibility and strength to maintain this posture on their  own. When sitting with proper posture, your ears will fall over your shoulders and your shoulders will fall over your hips. You should use the back of the chair to support your upper back. Your lower back will be in a neutral position, just slightly arched. You may place a small pillow or folded towel at the base of your lower back for support.  When working at a desk, create an environment that supports good, upright posture. Without extra support, muscles tire, which leads to excessive strain on joints and other tissues. Keep these recommendations in mind: CHAIR:  A chair should be able to slide under your desk when your back makes contact with the back of the chair. This allows you to work closely.  The chair's height should allow your eyes to be level with the upper part of your monitor and your hands to be slightly lower than your elbows. BODY POSITION  Your feet should make contact with the floor. If this is not possible, use a foot rest.  Keep your ears over your shoulders. This will reduce stress on your neck and lower back. INCORRECT SITTING POSTURES  If you are feeling tired and unable to assume a healthy sitting posture, do not slouch or slump. This puts excessive strain on your back tissues, causing more damage and pain. Healthier options include:  Using more support, like a lumbar pillow.  Switching tasks to something that requires you to be upright or  walking.  Talking a brief walk.  Lying down to rest in a neutral-spine position. PROLONGED STANDING WHILE SLIGHTLY LEANING FORWARD  When completing a task that requires you to lean forward while standing in one place for a long time, place either foot up on a stationary 2-4 inch high object to help maintain the best posture. When both feet are on the ground, the lower back tends to lose its slight inward curve. If this curve flattens (or becomes too large), then the back and your other joints will experience too much stress, tire more quickly, and can cause pain. CORRECT STANDING POSTURES Proper standing posture should be assumed with all daily activities, even if they only take a few moments, like when brushing your teeth. As in sitting, your ears should fall over your shoulders and your shoulders should fall over your hips. You should keep a slight tension in your abdominal muscles to brace your spine. Your tailbone should point down to the ground, not behind your body, resulting in an over-extended swayback posture.  INCORRECT STANDING POSTURES  Common incorrect standing postures include a forward head, locked knees and/or an excessive swayback. WALKING Walk with an upright posture. Your ears, shoulders and hips should all line-up. PROLONGED ACTIVITY IN A FLEXED POSITION When completing a task that requires you to bend forward at your waist or lean over a low surface, try to find a way to stabilize 3 out of 4 of your limbs. You can place a hand or elbow on your thigh or rest a knee on the surface you are reaching across. This will provide you more stability so that your muscles do not fatigue as quickly. By keeping your knees relaxed, or slightly bent, you will also reduce stress across your lower back. CORRECT LIFTING TECHNIQUES DO :   Assume a wide stance. This will provide you more stability and the opportunity to get as close as possible to the object which you are lifting.  Tense your  abdominals to brace your spine. Bend at the knees  and hips. Keeping your back locked in a neutral-spine position, lift using your leg muscles. Lift with your legs, keeping your back straight.  Test the weight of unknown objects before attempting to lift them.  Try to keep your elbows locked down at your sides in order get the best strength from your shoulders when carrying an object.  Always ask for help when lifting heavy or awkward objects. INCORRECT LIFTING TECHNIQUES DO NOT:   Lock your knees when lifting, even if it is a small object.  Bend and twist. Pivot at your feet or move your feet when needing to change directions.  Assume that you can safely pick up even a paper clip without proper posture.      IF you received an x-ray today, you will receive an invoice from [facility]. Please contact [facility] at [phone] with questions or concerns regarding your invoice.   IF you received labwork today, you will receive an invoice from [facility]. Please contact [facility] at [phone] with questions or concerns regarding your invoice.   Our billing staff will not be able to assist you with questions regarding bills from these companies.  You will be contacted with the lab results as soon as they are available. The fastest way to get your results is to activate your My Chart account. Instructions are located on the last page of this paperwork. If you have not heard from [place] regarding the results in 2 weeks, please contact this office.H

## 2017-04-17 NOTE — Telephone Encounter (Signed)
Authorization for pt CT Abdomen and Pelvis without contrast has been denied. An [employer] physician reviewer can be contacted at [phone] to discuss this. A provider courtesy review may also be requested within 180 days of this decision received on 04/14/17 by calling [employer] Pre-auth Department at [phone], extension [phone]. An appeal can also be started and instructions on that are provided in fax that I will give to Medical Records to place in the provider's box. All of this information above can be found in this as well.

## 2017-04-26 NOTE — Telephone Encounter (Signed)
Spoke with pt and she is okay with proceeding with U/S

## 2017-04-26 NOTE — Telephone Encounter (Signed)
Prior authorization form reviewed. It appears they would authorize an ultrasound prior to CT scanning. I am happy to order a complete abdominal ultrasound first if that is okay with the patient. Let me know either way.

## 2017-04-26 NOTE — Telephone Encounter (Signed)
Ultrasound order placed. 

## 2017-04-27 ENCOUNTER — Other Ambulatory Visit: Payer: Self-pay | Admitting: Pediatrics

## 2017-05-09 ENCOUNTER — Ambulatory Visit
Admission: RE | Admit: 2017-05-09 | Discharge: 2017-05-09 | Disposition: A | Discharge status: Home / Self Care | Payer: BLUE CROSS/BLUE SHIELD | Source: Ambulatory Visit | Attending: Family Medicine | Admitting: Family Medicine

## 2017-05-09 DIAGNOSIS — R140 Abdominal distension (gaseous): Secondary | ICD-10-CM

## 2017-05-09 DIAGNOSIS — R1084 Generalized abdominal pain: Secondary | ICD-10-CM

## 2017-05-11 ENCOUNTER — Encounter: Payer: Self-pay | Admitting: Family Medicine

## 2017-05-11 ENCOUNTER — Ambulatory Visit (INDEPENDENT_AMBULATORY_CARE_PROVIDER_SITE_OTHER): Payer: BLUE CROSS/BLUE SHIELD | Admitting: Family Medicine

## 2017-05-11 VITALS — BP 132/93 | HR 78 | Temp 98.4°F | Resp 18 | Ht 67.32 in | Wt 173.6 lb

## 2017-05-11 DIAGNOSIS — R05 Cough: Secondary | ICD-10-CM

## 2017-05-11 DIAGNOSIS — E785 Hyperlipidemia, unspecified: Secondary | ICD-10-CM | POA: Diagnosis not present

## 2017-05-11 DIAGNOSIS — F418 Other specified anxiety disorders: Secondary | ICD-10-CM

## 2017-05-11 DIAGNOSIS — R1084 Generalized abdominal pain: Secondary | ICD-10-CM | POA: Diagnosis not present

## 2017-05-11 DIAGNOSIS — R059 Cough, unspecified: Secondary | ICD-10-CM

## 2017-05-11 LAB — LIPID PANEL
Chol/HDL Ratio: 4.3 ratio (ref 0.0–4.4)
Cholesterol, Total: 192 mg/dL (ref 100–199)
HDL: 45 mg/dL (ref >39)
LDL Calculated: 80 mg/dL (ref 0–99)
Triglycerides: 337 mg/dL — ABNORMAL HIGH (ref 0–149)
VLDL Cholesterol Cal: 67 mg/dL — ABNORMAL HIGH (ref 5–40)

## 2017-05-11 MED ORDER — SERTRALINE HCL 50 MG PO TABS: 50.0000 mg | ORAL_TABLET | Freq: Every day | ORAL | 3 refills | Status: DC

## 2017-05-11 MED ORDER — FLUTICASONE PROPIONATE HFA 44 MCG/ACT IN AERO: 1.0000 | INHALATION_SPRAY | Freq: Two times a day (BID) | RESPIRATORY_TRACT | 6 refills | Status: DC

## 2017-05-11 NOTE — Patient Instructions (Addendum)
  Try Flovent inhaler 1-2 puffs twice per day, that may lessen the need for albuterol. Restart allergy medicine, and use dust mask as needed.   Restart Zoloft once per day. Follow-up in the next 6 weeks to see how that is going. Sooner if worse depression/anxiety symptoms.  I will check cholesterol, but continue Lipitor same dose for now.  Follow-up with gastroenterologist to discuss ultrasound results and abdominal pain further.  Return to the clinic or go to the nearest emergency room if any of your symptoms worsen or new symptoms occur.   IF you received an x-ray today, you will receive an [facility]. Please contact [facility] at [phone] with questions or concerns regarding your invoice.   IF you received labwork today, you will receive an invoice from [facility]. Please contact [facility] at [phone] with questions or concerns regarding your invoice.   Our billing staff will not be able to assist you with questions regarding bills from these companies.  You will be contacted with the lab results as soon as they are available. The fastest way to get your results is to activate your My Chart account. Instructions are located on the last page of this paperwork. If you have not heard from us regarding the results in 2 weeks, please contact this office.

## 2017-05-11 NOTE — Progress Notes (Addendum)
Subjective:  This chart was scribed for Kelly Agreste, MD by [name], at [place] at [facility].  This patient was seen in room 25 and the patient's care was started at 12:23 PM.   Chief Complaint  Patient presents with  . Hyperlipidemia    follow up      Patient ID: [name], female    DOB: [birth year], 51 y.o.   MRN: [id number]  HPI  HPI Comments: [name] is a 51 y.o. female who presents to Primary Care at [facility] for a follow up. She was seen for multiple concerns on July 26 th and here for a follow up today.  Abdominal pain, long standing, see details for last visit.  Has a history of alcohol abuse.  GI is Dr. Oletta Ortega. History of mild elevated LFT's, not interested in alcohol cessation but has cut back.  Reassuring UA in prior CBC okay. Initial plan for abdomen CT, then changed to ultrasound which was preformed two days ago.  Suspected fatty liver vs hepatocellular disease.  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  We had planned for her to follow up with GI . Referral was placed August 9th.  As far as LFT's, her ALT was 44 in [place] slightly from 40, 7 months ago.  AST and alk phos were normal, she had a negative acute hepatitis panel.  ---- Patient will be making an appointment for Dr. Oletta Ortega.    Cough improved last visit. Thought to have significant allergy component an dust exposure at work. Continued albuterol, recommended antihistamine, steroid nasal spray, dust mask as work. --- Patient states that the allergy medication helped her cough "slow down".  She has been compliant with her nasal spray and albuterol as well.    Chronic hip pain/low back pain without recent changes at last visit, she was seen by Kelly Delaine PA on August 3rd. Treated with naprosyn and Flexeril as well as home exercise program, heat and stretches.    Hyperlipidemia:  She takes Lipitor 10 mg QD. We discussed this in July.  Plan for fasting  lab visit in 6 weeks. --- Patient is currently fasting. She denies any new side effects with this medication.  Lab Results  Component Value Date   CHOL 229 (H) 10/06/2016   HDL 30 (L) 10/06/2016   LDLCALC Comment 10/06/2016   TRIG 437 (H) 10/06/2016   CHOLHDL 7.6 (H) 10/06/2016   Lab Results  Component Value Date   ALT 44 (H) 03/23/2017   AST 37 03/23/2017   ALKPHOS 110 03/23/2017   BILITOT 0.3 03/23/2017   Depression/Anxiety: Patient would like to start using Zoloft again and states that it is often when she does not want to get up/do anything.  Denies thoughts of suicide.  She has cut back on the amount of alcohol she consumes.   Patient is a Chief Operating Officer at [employer].   Patient Active Problem List   Diagnosis Date Noted  . 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 Asthma   . Hyperlipemia   . Hypertension   . Seizures ([place])    Past Surgical History:  Procedure Laterality Date  . ABDOMINAL HYSTERECTOMY    . CHOLECYSTECTOMY     Allergies  Allergen Reactions  . Contrast Media [Iodinated Diagnostic Agents] Anaphylaxis    Contrast Dye   . Codeine   .  Iohexol      Desc: UNABLE TO BREATH   . Other Other (See Comments)    Cashews and pistachios; reaction unknown   Prior to Admission medications   Medication Sig Start Date End Date Taking? Authorizing Provider  albuterol (PROVENTIL HFA;VENTOLIN HFA) 108 (90 Base) MCG/ACT inhaler Inhale 2 puffs into the lungs every 6 (six) hours as needed for wheezing or shortness of breath. 03/23/17   Kelly Agreste, MD  atorvastatin (LIPITOR) 10 MG tablet TAKE 1 TABLET BY MOUTH ONCE DAILY AT 6:00 PM 03/23/17   Kelly Agreste, MD  cyclobenzaprine (FLEXERIL) 5 MG tablet Take 1 tablet (5 mg total) by mouth 3 (three) times daily as needed for muscle spasms. 04/14/17   Kelly Delaine D, PA-C  esomeprazole (NEXIUM) 20 MG packet Take 20 mg  by mouth daily before breakfast.    [provider]  Fish Oil-Cholecalciferol (FISH OIL + D3) 1000-1000 MG-UNIT CAPS Take by mouth.    [provider]  levETIRAcetam (KEPPRA) 500 MG tablet TAKE ONE TABLET BY MOUTH IN THE MORNING AND TWO TABLETS AT BEDTIME 08/01/16   Dennie Bible, NP  lisinopril-hydrochlorothiazide (PRINZIDE,ZESTORETIC) 20-25 MG tablet Take 1 tablet by mouth daily. 03/23/17   Kelly Agreste, MD  Multiple Vitamin (MULTIVITAMIN) tablet Take 1 tablet by mouth daily.    [provider]  naproxen (NAPROSYN) 500 MG tablet Take 1 tablet (500 mg total) by mouth 2 (two) times daily with a meal. 04/14/17   Kelly Douglas, PA-C   Social History   Social History  . Marital status: Widowed    Spouse name: N/A  . Number of children: 2  . Years of education: 12   Occupational History  . Not on file.   Social History Main Topics  . Smoking status: Former Research scientist (life sciences)  . Smokeless tobacco: Never Used  . Alcohol use 1.2 oz/week    2 Cans of beer per week     Comment: beer, liquor  . Drug use: No  . Sexual activity: Yes    Birth control/ protection: Surgical   Other Topics Concern  . Not on file   Social History Narrative   Patient is widowed.   Patient has 2 children.   Patient is currently unemployed.   Patient has a high school education.   Patient is right-handed.   Patient drinks 2 glass of caffeine daily ( soda and tea).    Review of Systems  Constitutional: Negative for chills and fever.  Eyes: Negative for pain, redness and itching.  Respiratory: Positive for cough.   Gastrointestinal: Negative for nausea and vomiting.  Neurological: Negative for seizures, syncope and speech difficulty.  Psychiatric/Behavioral: Negative for self-injury.       Anhedonia        Objective:   Physical Exam  Constitutional: She is oriented to person, place, and time. She appears well-developed and well-nourished.  HENT:  Head: Normocephalic and  atraumatic.  Eyes: Pupils are equal, round, and reactive to light. Conjunctivae and EOM are normal.  Neck: Carotid bruit is not present.  Cardiovascular: Normal rate, regular rhythm, normal heart sounds and intact distal pulses.   Pulmonary/Chest: Effort normal and breath sounds normal.  Abdominal: Soft. She exhibits no pulsatile midline mass. There is no tenderness.  Neurological: She is alert and oriented to person, place, and time.  Skin: Skin is warm and dry.  Psychiatric: She has a normal mood and affect. Her behavior is normal.  Vitals reviewed.  Vitals:  05/11/17 1156  BP: (!) 132/93  Pulse: 78  Resp: 18  Temp: 98.4 F (36.9 C)  TempSrc: Oral  SpO2: 94%  Weight: 173 lb 9.6 oz (78.7 kg)  Height: 5' 7.32" (1.71 m)        Assessment & Plan:   [name] is a 51 y.o. female Hyperlipidemia, unspecified hyperlipidemia type - Plan: Lipid panel  - Tolerating Lipitor, continue same dose. Lipid panel pending.  Depression with anxiety - Plan: sertraline (ZOLOFT) 50 MG tablet  -Increase anhedonia, increased depression/anxiety symptoms, commended on continued cutting back on alcohol. Will restart Zoloft at her request, RTC precautions if new side effects or worsening depression.   Cough - Plan: fluticasone (FLOVENT HFA) 44 MCG/ACT inhaler  -Improved, but still persistent albuterol use. Start Flovent HFA for maintenance, continue albuterol if needed for breakthrough symptoms.   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 Follow-up with gastroenterology.  Meds ordered this encounter  Medications  . fluticasone (FLOVENT HFA) 44 MCG/ACT inhaler    Sig: Inhale 1-2 puffs into the lungs 2 (two) times daily.    Dispense:  1 Inhaler    Refill:  6  . sertraline (ZOLOFT) 50 MG tablet    Sig: Take 1 tablet (50 mg total) by  mouth daily.    Dispense:  30 tablet    Refill:  3   Patient Instructions    Try Flovent inhaler 1-2 puffs twice per day, that may lessen the need for albuterol. Restart allergy medicine, and use dust mask as needed.   Restart Zoloft once per day. Follow-up in the next 6 weeks to see how that is going. Sooner if worse depression/anxiety symptoms.  I will check cholesterol, but continue Lipitor same dose for now.  Follow-up with gastroenterologist to discuss ultrasound results and abdominal pain further.  Return to the clinic or go to the nearest emergency room if any of your symptoms worsen or new symptoms occur.   IF you received an x-ray today, you will receive an invoice from [facility]. Please contact [facility] at [phone] with questions or concerns regarding your invoice.   IF you received labwork today, you will receive an invoice from [facility]. Please contact [facility] at [phone] with questions or concerns regarding your invoice.   Our billing staff will not be able to assist you with questions regarding bills from these companies.  You will be contacted with the lab results as soon as they are available. The fastest way to get your results is to activate your My Chart account. Instructions are located on the last page of this paperwork. If you have not heard from [place] regarding the results in 2 weeks, please contact this office.      I personally performed the services described in this documentation, which was scribed in my presence. The recorded information has been reviewed and considered for accuracy and completeness, addended by me as needed, and agree with information above.  Signed,   Merri Ray, MD [facility] at [place].  05/13/17 3:05 PM

## 2017-05-18 ENCOUNTER — Encounter: Payer: Self-pay | Admitting: Radiology

## 2017-06-13 ENCOUNTER — Ambulatory Visit
Admission: RE | Admit: 2017-06-13 | Discharge: 2017-06-13 | Disposition: A | Discharge status: Home / Self Care | Payer: BLUE CROSS/BLUE SHIELD | Source: Ambulatory Visit | Attending: Family Medicine | Admitting: Family Medicine

## 2017-06-13 DIAGNOSIS — Z1231 Encounter for screening mammogram for malignant neoplasm of breast: Secondary | ICD-10-CM | POA: Diagnosis not present

## 2017-06-13 DIAGNOSIS — Z1239 Encounter for other screening for malignant neoplasm of breast: Secondary | ICD-10-CM

## 2017-06-22 ENCOUNTER — Ambulatory Visit: Payer: BLUE CROSS/BLUE SHIELD | Admitting: Family Medicine

## 2017-06-26 ENCOUNTER — Ambulatory Visit: Payer: BLUE CROSS/BLUE SHIELD | Admitting: Family Medicine

## 2017-07-27 ENCOUNTER — Ambulatory Visit: Payer: Self-pay | Admitting: Nurse Practitioner

## 2017-07-31 NOTE — Progress Notes (Signed)
[facility]  PATIENT: [name] DOB: 02/23/1966   REASON FOR VISIT: Follow-up for epilepsy new complaint of burning in the feet HISTORY FROM: Patient    HISTORY OF PRESENT ILLNESS:Ms. [name], 51 year old white female returns for followup. She has history of epilepsy with last seizure 11/29/2015 after being stressed over a close a friends death. Prior to that her last seizure was Jan 17, 2013 after missing several doses of her Keppra medication. She is currently on Keppra 500 mg a.m. and 1000 PM. She denies missing any doses of medication. She denies any side effects to the drug.  She has a new complaint of burning in her feet.  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  She has never used support stockings.  She has been told she is prediabetic by her primary care Dr. Chilton SiGreen.  She returns for reevaluation .  She now has insurance and her blood pressure is in good control because she is taking her hypertensive medications. HISTORY: This 51 year old cauc. right handed female presents with sudden onset of "seizures" fits, that started with an episode on May 12th at home, in 8 AM. She lost her husband on April 6th, and had not been able to stay in the marital home, slept at her sisters. Her husband suffered a sudden cardiac death in the home, in the presence of several family members. She came to her own house on 3712 th may, in the morning to get the post from her letter box. Than she has a memory gap- She found herself on the floor, was surprised to "sleep on the floor" , nobody witnessed this in effect. She had lots of bruising, no incontinence/ no tongue bite , but shoulder pain. She went to the PCP the next day, Xray of the shoulder showed no bone injuries, she did not want to have CT head taken, because of insurance difficulties.  On the 13 th April 10 she had a second spell, this is a Thursday night, she had gone with her sister to her house, she  entered the house "fell suddenly,,with herlips blue, her eyes rolling," she fell into a glass table, suff. injuries plus urine and stool incontinence, [facility] after 3 minutes of generalized convusions. She was released without further work up, had severe bruising in the face and right shouler. Her sister states she believes the seizure lasted 7-8 minutes - a rather unlikely duration.Next day she developed a fever, [facility], this time had a CT of the brain and lab work- with no abnormal results, except aspiration pneumonia.     REVIEW OF SYSTEMS: Full 14 system review of systems performed and notable only for those listed, all others are neg:  Constitutional: neg  Cardiovascular: neg Ear/Nose/Throat: neg  Skin: neg Eyes: neg Respiratory: , neg Gastroitestinal: neg  Hematology/Lymphatic: neg  Endocrine: neg Musculoskeletal:neg Allergy/Immunology: Seasonal allergies Neurological: Seizure disorder, burning feet Psychiatric:  Sleep : neg   ALLERGIES: Allergies  Allergen Reactions  . Contrast Media [Iodinated Diagnostic Agents] Anaphylaxis    Contrast Dye   . Codeine   . Iohexol      Desc: UNABLE TO BREATH   . Other Other (See Comments)    Cashews and pistachios; reaction unknown    HOME MEDICATIONS: Outpatient Medications Prior to Visit  Medication Sig Dispense Refill  . albuterol (PROVENTIL HFA;VENTOLIN HFA) 108 (90 Base) MCG/ACT inhaler Inhale 2 puffs  into the lungs every 6 (six) hours as needed for wheezing or shortness of breath. 1 Inhaler 0  . atorvastatin (LIPITOR) 10 MG tablet TAKE 1 TABLET BY MOUTH ONCE DAILY AT 6:00 PM 90 tablet 1  . cyclobenzaprine (FLEXERIL) 5 MG tablet Take 1 tablet (5 mg total) by mouth 3 (three) times daily as needed for muscle spasms. 60 tablet 0  . esomeprazole (NEXIUM) 20 MG packet Take 20 mg by mouth daily before breakfast.    . Fish Oil-Cholecalciferol (FISH OIL + D3) 1000-1000 MG-UNIT CAPS Take by mouth.      . fluticasone (FLOVENT HFA) 44 MCG/ACT inhaler Inhale 1-2 puffs into the lungs 2 (two) times daily. 1 Inhaler 6  . levETIRAcetam (KEPPRA) 500 MG tablet TAKE ONE TABLET BY MOUTH IN THE MORNING AND TWO TABLETS AT BEDTIME 90 tablet 11  . lisinopril-hydrochlorothiazide (PRINZIDE,ZESTORETIC) 20-25 MG tablet Take 1 tablet by mouth daily. 90 tablet 1  . Multiple Vitamin (MULTIVITAMIN) tablet Take 1 tablet by mouth daily.    . naproxen (NAPROSYN) 500 MG tablet Take 1 tablet (500 mg total) by mouth 2 (two) times daily with a meal. 30 tablet 0  . sertraline (ZOLOFT) 50 MG tablet Take 1 tablet (50 mg total) by mouth daily. 30 tablet 3   No facility-administered medications prior to visit.     PAST MEDICAL HISTORY: Past Medical History:  Diagnosis Date  . Asthma   . Hyperlipemia   . Hypertension   . Seizures (HCC)     PAST SURGICAL HISTORY: Past Surgical History:  Procedure Laterality Date  . ABDOMINAL HYSTERECTOMY    . CHOLECYSTECTOMY      FAMILY HISTORY: Family History  Problem Relation Age of Onset  . Breast cancer Mother   . Hypertension Mother   . Heart failure Father   . Hypertension Father   . Hyperlipidemia Father     SOCIAL HISTORY: Social History   Socioeconomic History  . Marital status: Widowed    Spouse name: Not on file  . Number of children: 2  . Years of education: 66  . Highest education level: Not on file  Social Needs  . Financial resource strain: Not on file  . Food insecurity - worry: Not on file  . Food insecurity - inability: Not on file  . Transportation needs - medical: Not on file  . Transportation needs - non-medical: Not on file  Occupational History  . Not on file  Tobacco Use  . Smoking status: Former Games developer  . Smokeless tobacco: Never Used  Substance and Sexual Activity  . Alcohol use: Yes    Alcohol/week: 1.2 oz    Types: 2 Cans of beer per week    Comment: beer, liquor  . Drug use: No  . Sexual activity: Yes    Birth  control/protection: Surgical  Other Topics Concern  . Not on file  Social History Narrative   Patient is widowed.   Patient has 2 children.   Patient is currently unemployed.   Patient has a high school education.   Patient is right-handed.   Patient drinks 2 glass of caffeine daily ( soda and tea).     PHYSICAL EXAM  Vitals:   08/01/17 0908  BP: (!) 129/93  Pulse: 86  Weight: 175 lb 12.8 oz (79.7 kg)   Body mass index is 27.27 kg/m. Generalized: Well developed, in no acute distress  Head: normocephalic and atraumatic. Oropharynx benign  Neck: Supple,  Musculoskeletal: No deformity   Neurological examination  Mentation: Alert oriented to time, place, history taking. Follows all commands speech and language fluent Cranial nerve II-XII:  Pupils were equal round reactive to light extraocular movements were full, visual field were full on confrontational test. Facial sensation and strength were normal. hearing was intact to finger rubbing bilaterally. Uvula tongue midline. head turning and shoulder shrug and were normal and symmetric.Tongue protrusion into cheek strength was normal. Motor: The motor testing reveals 5 over 5 strength of all 4 extremities. Good symmetric motor tone is noted throughout.  Sensory: Sensory testing is intact to soft touch, vibratory and proprioception.  Decreased pinprick to toes otherwise normal.  Coordination: Cerebellar testing reveals good finger-nose-finger and heel-to-shin bilaterally.  Gait and station: Gait is normal. Tandem gait is normal. Romberg is negative. Reflexes: Deep tendon reflexes are symmetric and normal bilaterally.  DIAGNOSTIC DATA (LABS, IMAGING, TESTING) - ASSESSMENT AND PLAN  [age] y.o. year old female  has a past medical history of Asthma; Hyperlipemia; Hypertension; and Seizures (HCC). here to follow-up for seizure disorder. Last seizure event occurred November 29, 2015 after a friend died.  No complaint of burning in her  feet.  She has been diagnosed as prediabetic.  Continue Keppra at current dose will refill for one year Call for any seizure activity Follow-up yearly and when necessary For her burning in the feet we can get EMG nerve conduction test, however patient declines at this time. 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, [address] [place], [place] [address] [phone]

## 2017-08-01 ENCOUNTER — Ambulatory Visit: Payer: BLUE CROSS/BLUE SHIELD | Admitting: Nurse Practitioner

## 2017-08-01 ENCOUNTER — Encounter (INDEPENDENT_AMBULATORY_CARE_PROVIDER_SITE_OTHER): Payer: Self-pay

## 2017-08-01 ENCOUNTER — Encounter: Payer: Self-pay | Admitting: Nurse Practitioner

## 2017-08-01 VITALS — BP 129/93 | HR 86 | Wt 175.8 lb

## 2017-08-01 DIAGNOSIS — G40309 Generalized idiopathic epilepsy and epileptic syndromes, not intractable, without status epilepticus: Secondary | ICD-10-CM

## 2017-08-01 DIAGNOSIS — R202 Paresthesia of skin: Secondary | ICD-10-CM

## 2017-08-01 MED ORDER — LEVETIRACETAM 500 MG PO TABS: ORAL_TABLET | ORAL | 11 refills | Status: DC

## 2017-08-01 NOTE — Patient Instructions (Signed)
Continue Keppra at current dose will refill for one year. Call for any seizure activity Follow-up yearly and when necessary 

## 2017-08-01 NOTE — Progress Notes (Signed)
I agree with the assessment and plan as directed by NP . I was available for consultation.   Lynora Dymond, MD  

## 2017-09-01 ENCOUNTER — Ambulatory Visit (INDEPENDENT_AMBULATORY_CARE_PROVIDER_SITE_OTHER): Payer: BLUE CROSS/BLUE SHIELD

## 2017-09-01 ENCOUNTER — Encounter: Payer: Self-pay | Admitting: Osteopathic Medicine

## 2017-09-01 ENCOUNTER — Ambulatory Visit: Payer: BLUE CROSS/BLUE SHIELD | Admitting: Osteopathic Medicine

## 2017-09-01 VITALS — BP 122/78 | HR 103 | Temp 99.7°F | Resp 16 | Ht 67.0 in | Wt 171.2 lb

## 2017-09-01 DIAGNOSIS — R05 Cough: Secondary | ICD-10-CM

## 2017-09-01 DIAGNOSIS — R059 Cough, unspecified: Secondary | ICD-10-CM

## 2017-09-01 DIAGNOSIS — J181 Lobar pneumonia, unspecified organism: Secondary | ICD-10-CM | POA: Diagnosis not present

## 2017-09-01 DIAGNOSIS — J4541 Moderate persistent asthma with (acute) exacerbation: Secondary | ICD-10-CM | POA: Diagnosis not present

## 2017-09-01 DIAGNOSIS — J189 Pneumonia, unspecified organism: Secondary | ICD-10-CM

## 2017-09-01 MED ORDER — PREDNISONE 20 MG PO TABS: 20.0000 mg | ORAL_TABLET | Freq: Two times a day (BID) | ORAL | 0 refills | Status: DC

## 2017-09-01 MED ORDER — LEVOFLOXACIN 500 MG PO TABS: 500.0000 mg | ORAL_TABLET | Freq: Every day | ORAL | 0 refills | Status: DC

## 2017-09-01 MED ORDER — IPRATROPIUM-ALBUTEROL 0.5-2.5 (3) MG/3ML IN SOLN: 3.0000 mL | Freq: Once | RESPIRATORY_TRACT | Status: DC

## 2017-09-01 MED ORDER — IPRATROPIUM BROMIDE 0.02 % IN SOLN
0.5000 mg | Freq: Once | RESPIRATORY_TRACT | Status: AC
  Administered 2017-09-01: 0.5 mg via RESPIRATORY_TRACT

## 2017-09-01 MED ORDER — ALBUTEROL SULFATE (2.5 MG/3ML) 0.083% IN NEBU
2.5000 mg | INHALATION_SOLUTION | Freq: Once | RESPIRATORY_TRACT | Status: AC
  Administered 2017-09-01: 2.5 mg via RESPIRATORY_TRACT

## 2017-09-01 MED ORDER — HYDROCOD POLST-CPM POLST ER 10-8 MG/5ML PO SUER: 5.0000 mL | Freq: Two times a day (BID) | ORAL | 0 refills | Status: DC | PRN

## 2017-09-01 MED ORDER — BENZONATATE 200 MG PO CAPS: 200.0000 mg | ORAL_CAPSULE | Freq: Three times a day (TID) | ORAL | 1 refills | Status: DC | PRN

## 2017-09-01 NOTE — Progress Notes (Signed)
HPI: [name] is a 51 y.o. female with has a past medical history of Asthma, Hyperlipemia, Hypertension, and Seizures (HCC).  who presents to Primary Care at [facility] today, 09/01/17,  for chief complaint of:  Chief Complaint  Patient presents with  . Cough    x3 days; hurts to cough  . Wheezing  . Generalized Body Aches    History of asthma but inhalers have not been helping. She has been sick for about 3 days, fever at home. Wheezing and generalized body aches. Reports some shortness of breath with coughing spells.    Past medical, surgical, social and family history reviewed:  Patient Active Problem List   Diagnosis Date Noted  . Paresthesias 08/01/2017  . 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 ABDOMINAL HYSTERECTOMY    . CHOLECYSTECTOMY      Social History   Tobacco Use  . Smoking status: Former Games developermoker  . Smokeless tobacco: Never Used  Substance Use Topics  . Alcohol use: Yes    Alcohol/week: 1.2 oz    Types: 2 Cans of beer per week    Comment: beer, liquor    Family History  Problem Relation Age of Onset  . Breast cancer Mother   . Hypertension Mother   . Heart failure Father   . Hypertension Father   . Hyperlipidemia Father      Current medication list and allergy/intolerance information reviewed:    Current Outpatient Medications  Medication Sig Dispense Refill  . albuterol (PROVENTIL HFA;VENTOLIN HFA) 108 (90 Base) MCG/ACT inhaler Inhale 2 puffs into the lungs every 6 (six) hours as needed for wheezing or shortness of breath. 1 Inhaler 0  . atorvastatin (LIPITOR) 10 MG tablet TAKE 1 TABLET BY MOUTH ONCE DAILY AT 6:00 PM 90 tablet 1  . cyclobenzaprine (FLEXERIL) 5 MG tablet Take 1 tablet (5 mg total) by mouth 3 (three) times daily as needed for muscle spasms. 60 tablet 0  . esomeprazole (NEXIUM) 20 MG packet Take 20 mg by mouth daily before  breakfast.    . Fish Oil-Cholecalciferol (FISH OIL + D3) 1000-1000 MG-UNIT CAPS Take by mouth.    . fluticasone (FLOVENT HFA) 44 MCG/ACT inhaler Inhale 1-2 puffs into the lungs 2 (two) times daily. 1 Inhaler 6  . levETIRAcetam (KEPPRA) 500 MG tablet TAKE ONE TABLET BY MOUTH IN THE MORNING AND TWO TABLETS AT BEDTIME 90 tablet 11  . lisinopril-hydrochlorothiazide (PRINZIDE,ZESTORETIC) 20-25 MG tablet Take 1 tablet by mouth daily. 90 tablet 1  . Multiple Vitamin (MULTIVITAMIN) tablet Take 1 tablet by mouth daily.    . naproxen (NAPROSYN) 500 MG tablet Take 1 tablet (500 mg total) by mouth 2 (two) times daily with a meal. 30 tablet 0  . sertraline (ZOLOFT) 50 MG tablet Take 1 tablet (50 mg total) by mouth daily. 30 tablet 3   No current facility-administered medications for this visit.     Allergies  Allergen Reactions  . Contrast Media [Iodinated Diagnostic Agents] Anaphylaxis    Contrast Dye   . Codeine   . Iohexol      Desc: UNABLE TO BREATH   . Other Other (See Comments)    Cashews and pistachios; reaction unknown      Review of Systems:  Constitutional:  +fever, +chills, No unintentional weight changes. +significant fatigue.   HEENT: No  headache, no vision change, no hearing change, No sore throat, +sinus  pressure  Cardiac: No  chest pain, No  pressure, No palpitations, No  Orthopnea  Respiratory:  +shortness of breath. +Cough  Gastrointestinal: No  abdominal pain, No  nausea, No  vomiting  Musculoskeletal: No new myalgia/arthralgia  Skin: No  Rash  Neurologic: +generalized weakness, No  dizziness   Exam:  BP 122/78   Pulse (!) 103   Temp 99.7 F (37.6 C) (Oral)   Resp 16   Ht 5\' 7"  (1.702 m)   Wt 171 lb 3.2 oz (77.7 kg)   SpO2 93%   BMI 26.81 kg/m   Constitutional: VS see above. General Appearance: alert, well-developed, well-nourished, NAD  Eyes: Normal lids and conjunctive, non-icteric sclera  Ears, Nose, Mouth, Throat: MMM, Normal external inspection  ears/nares/mouth/lips/gums. TM normal bilaterally. Pharynx/tonsils +erythema, no exudate. Nasal mucosa normal.   Neck: No masses, trachea midline. No thyroid enlargement. No tenderness/mass appreciated. No lymphadenopathy  Respiratory: Normal respiratory effort. + Bilateral expiratory wheeze, no rhonchi, no rales  Cardiovascular: S1/S2 normal, no murmur, no rub/gallop auscultated. RRR  Musculoskeletal: Gait normal.   Neurological: Normal balance/coordination. No tremor.   Psychiatric: Normal judgment/insight. Normal mood and affect. Oriented x3.     Dg Chest 2 View  Result Date: 09/01/2017 CLINICAL DATA:  Cough.  Asthma. EXAM: CHEST  2 VIEW COMPARISON:  03/23/2017 FINDINGS: The cardiomediastinal silhouette is within normal limits. There is peribronchial thickening. Patchy airspace opacities are present in the left lower lobe and lingula. No pleural effusion or pneumothorax is identified. No acute osseous abnormality is seen. Surgical clips are noted in the upper abdomen. IMPRESSION: Patchy left lung airspace disease consistent with pneumonia. Electronically Signed   By: Sebastian Ache M.D.   On: 09/01/2017 15:46     ASSESSMENT/PLAN:   Community acquired pneumonia of left lower lobe of lung (HCC) - Plan: levofloxacin (LEVAQUIN) 500 MG tablet  Cough - Plan: DG Chest 2 View, benzonatate (TESSALON) 200 MG capsule, chlorpheniramine-HYDROcodone (TUSSIONEX) 10-8 MG/5ML SUER, DISCONTINUED: ipratropium-albuterol (DUONEB) 0.5-2.5 (3) MG/3ML nebulizer solution 3 mL  Moderate persistent asthma with acute exacerbation - Plan: DG Chest 2 View, ipratropium (ATROVENT) nebulizer solution 0.5 mg, albuterol (PROVENTIL) (2.5 MG/3ML) 0.083% nebulizer solution 2.5 mg, predniSONE (DELTASONE) 20 MG tablet, DISCONTINUED: ipratropium-albuterol (DUONEB) 0.5-2.5 (3) MG/3ML nebulizer solution 3 mL    Patient Instructions   Community-Acquired Pneumonia, Adult Pneumonia is an infection of the lungs. One type of  pneumonia can happen while a person is in a hospital. A different type can happen when a person is not in a hospital (community-acquired pneumonia). It is easy for this kind to spread from person to person. It can spread to you if you breathe near an infected person who coughs or sneezes. Some symptoms include:  A dry cough.  A wet (productive) cough.  Fever.  Sweating.  Chest pain.  Follow these instructions at home:  Take over-the-counter and prescription medicines only as told by your doctor. ? Only take cough medicine if you are losing sleep. ? If you were prescribed an antibiotic medicine, take it as told by your doctor. Do not stop taking the antibiotic even if you start to feel better.  Sleep with your head and neck raised (elevated). You can do this by putting a few pillows under your head, or you can sleep in a recliner.  Do not use tobacco products. These include cigarettes, chewing tobacco, and e-cigarettes. If you need help quitting, ask your doctor.  Drink enough water to keep your pee (urine) clear or pale yellow.  A shot (vaccine) can help prevent pneumonia. Shots are often suggested for:  People older than 51 years of age.  People older than 51 years of age: ? Who are having cancer treatment. ? Who have long-term (chronic) lung disease. ? Who have problems with their body's defense system (immune system).  You may also prevent pneumonia if you take these actions:  Get the flu (influenza) shot every year.  Go to the dentist as often as told.  Wash your hands often. If soap and water are not available, use hand sanitizer.  Contact a doctor if:  You have a fever.  You lose sleep because your cough medicine does not help. Get help right away if:  You are short of breath and it gets worse.  You have more chest pain.  Your sickness gets worse. This is very serious if: ? You are an older adult. ? Your body's defense system is weak.  You cough up  blood. This information is not intended to replace advice given to you by your health care provider. Make sure you discuss any questions you have with your health care provider. Document Released: 02/15/2008 Document Revised: 02/04/2016 Document Reviewed: 12/24/2014 Elsevier Interactive Patient Education  2018 [employer]   IF you received an x-ray today, you will receive an invoice from [facility]. Please contact [facility] at [phone] with questions or concerns regarding your invoice.   IF you received labwork today, you will receive an invoice from [facility]. Please contact [facility] at [phone] with questions or concerns regarding your invoice.   Our billing staff will not be able to assist you with questions regarding bills from these companies.  You will be contacted with the lab results as soon as they are available. The fastest way to get your results is to activate your My Chart account. Instructions are located on the last page of this paperwork. If you have not heard from us regarding the results in 2 weeks, please contact this office.         Visit summary with medication list and pertinent instructions was printed for patient to review. All questions at time of visit were answered - patient instructed to contact office with any additional concerns. ER/RTC precautions were reviewed with the patient.   Follow-up plan: Return if symptoms worsen or fail to improve.  Note: Total time spent 25 minutes, greater than 50% of the visit was spent face-to-face counseling and coordinating care for the following: The primary encounter diagnosis was Community acquired pneumonia of left lower lobe of lung (HCC). Diagnoses of Cough and Moderate persistent asthma with acute exacerbation were also pertinent to this visit.Marland Kitchen.  Please note: voice recognition software was used to produce this document, and typos may escape review. Please contact me for any needed  clarifications.

## 2017-09-01 NOTE — Patient Instructions (Addendum)
  Community-Acquired Pneumonia, Adult Pneumonia is an infection of the lungs. One type of pneumonia can happen while a person is in a hospital. A different type can happen when a person is not in a hospital (community-acquired pneumonia). It is easy for this kind to spread from person to person. It can spread to you if you breathe near an infected person who coughs or sneezes. Some symptoms include:  A dry cough.  A wet (productive) cough.  Fever.  Sweating.  Chest pain.  Follow these instructions at home:  Take over-the-counter and prescription medicines only as told by your doctor. ? Only take cough medicine if you are losing sleep. ? If you were prescribed an antibiotic medicine, take it as told by your doctor. Do not stop taking the antibiotic even if you start to feel better.  Sleep with your head and neck raised (elevated). You can do this by putting a few pillows under your head, or you can sleep in a recliner.  Do not use tobacco products. These include cigarettes, chewing tobacco, and e-cigarettes. If you need help quitting, ask your doctor.  Drink enough water to keep your pee (urine) clear or pale yellow. A shot (vaccine) can help prevent pneumonia. Shots are often suggested for:  People older than 51 years of age.  People older than 51 years of age: ? Who are having cancer treatment. ? Who have long-term (chronic) lung disease. ? Who have problems with their body's defense system (immune system).  You may also prevent pneumonia if you take these actions:  Get the flu (influenza) shot every year.  Go to the dentist as often as told.  Wash your hands often. If soap and water are not available, use hand sanitizer.  Contact a doctor if:  You have a fever.  You lose sleep because your cough medicine does not help. Get help right away if:  You are short of breath and it gets worse.  You have more chest pain.  Your sickness gets worse. This is very serious  if: ? You are an older adult. ? Your body's defense system is weak.  You cough up blood. This information is not intended to replace advice given to you by your health care provider. Make sure you discuss any questions you have with your health care provider. Document Released: 02/15/2008 Document Revised: 02/04/2016 Document Reviewed: 12/24/2014 Elsevier Interactive Patient Education  2018 Elsevier Inc.   IF you received an x-ray today, you will receive an invoice from [facility]. Please contact [facility] at [phone] with questions or concerns regarding your invoice.   IF you received labwork today, you will receive an invoice from [facility]. Please contact [facility] at [phone] with questions or concerns regarding your invoice.   Our billing staff will not be able to assist you with questions regarding bills from these companies.  You will be contacted with the lab results as soon as they are available. The fastest way to get your results is to activate your My Chart account. Instructions are located on the last page of this paperwork. If you have not heard from us regarding the results in 2 weeks, please contact this office.      

## 2017-09-09 ENCOUNTER — Encounter: Payer: Self-pay | Admitting: Emergency Medicine

## 2017-09-09 ENCOUNTER — Other Ambulatory Visit: Payer: Self-pay

## 2017-09-09 ENCOUNTER — Ambulatory Visit (INDEPENDENT_AMBULATORY_CARE_PROVIDER_SITE_OTHER): Payer: BLUE CROSS/BLUE SHIELD | Admitting: Emergency Medicine

## 2017-09-09 VITALS — BP 110/80 | HR 85 | Temp 97.8°F | Resp 12 | Ht 66.75 in | Wt 174.5 lb

## 2017-09-09 DIAGNOSIS — B3731 Acute candidiasis of vulva and vagina: Secondary | ICD-10-CM

## 2017-09-09 DIAGNOSIS — B373 Candidiasis of vulva and vagina: Secondary | ICD-10-CM | POA: Diagnosis not present

## 2017-09-09 MED ORDER — FLUCONAZOLE 150 MG PO TABS: 150.0000 mg | ORAL_TABLET | Freq: Once | ORAL | 1 refills | Status: AC

## 2017-09-09 NOTE — Progress Notes (Signed)
[name] 51 y.o.   Chief Complaint  Patient presents with  . Thrush    seen last Friday for PNA  . Vaginitis    Yeast from Antibiotics    HISTORY OF PRESENT ILLNESS: This is a 51 y.o. female complaining of vaginal yeast infection; recently on antibiotics.  HPI   Prior to Admission medications   Medication Sig Start Date End Date Taking? Authorizing Provider  albuterol (PROVENTIL HFA;VENTOLIN HFA) 108 (90 Base) MCG/ACT inhaler Inhale 2 puffs into the lungs every 6 (six) hours as needed for wheezing or shortness of breath. 03/23/17  Yes Shade FloodGreene, Jeffrey R, MD  atorvastatin (LIPITOR) 10 MG tablet TAKE 1 TABLET BY MOUTH ONCE DAILY AT 6:00 PM 03/23/17  Yes Shade FloodGreene, Jeffrey R, MD  cyclobenzaprine (FLEXERIL) 5 MG tablet Take 1 tablet (5 mg total) by mouth 3 (three) times daily as needed for muscle spasms. 04/14/17  Yes Barnett AbuWiseman, Gren[place]ittany D, PA-C  esomeprazole (NEXIUM) 20 MG packet Take 20 mg by mouth daily before breakfast.   Yes [provider]  Fish Oil-Cholecalciferol (FISH OIL + D3) 1000-1000 MG-UNIT CAPS Take by mouth.   Yes [provider]  fluticasone (FLOVENT HFA) 44 MCG/ACT inhaler Inhale 1-2 puffs into the lungs 2 (two) times daily. 05/11/17  Yes Shade FloodGreene, Jeffrey R, MD  levETIRAcetam (KEPPRA) 500 MG tablet TAKE ONE TABLET BY MOUTH IN THE MORNING AND TWO TABLETS AT BEDTIME 08/01/17  Yes Nilda RiggsMartin, Nancy Carolyn, NP  lisinopril-hydrochlorothiazide (PRINZIDE,ZESTORETIC) 20-25 MG tablet Take 1 tablet by mouth daily. 03/23/17  Yes Shade FloodGreene, Jeffrey R, MD  Multiple Vitamin (MULTIVITAMIN) tablet Take 1 tablet by mouth daily.   Yes [provider]  naproxen (NAPROSYN) 500 MG tablet Take 1 tablet (500 mg total) by mouth 2 (two) times daily with a meal. 04/14/17  Yes Barnett AbuWiseman, [place]ittany D, PA-C  sertraline (ZOLOFT) 50 MG tablet Take 1 tablet (50 mg total) by mouth daily. 05/11/17  Yes Shade FloodGreene, Jeffrey R, MD  benzonatate (TESSALON) 200 MG capsule Take 1 capsule (200 mg total) by  mouth 3 (three) times daily as needed for cough. Patient not taking: Reported on 09/09/2017 09/01/17   Sunnie NielsenAlexander, Natalie, DO  chlorpheniramine-HYDROcodone (TUSSIONEX) 10-8 MG/5ML SUER Take 5 mLs by mouth every 12 (twelve) hours as needed for cough (note: will cause drowsiness.). Patient not taking: Reported on 09/09/2017 09/01/17   Sunnie NielsenAlexander, Natalie, DO  fluconazole (DIFLUCAN) 150 MG tablet Take 1 tablet (150 mg total) by mouth once for 1 dose. Repeat in 5-7 days. 09/09/17 09/09/17  Georgina QuintSagardia, Cherisse Carrell Jose, MD    Allergies  Allergen Reactions  . Contrast Media [Iodinated Diagnostic Agents] Anaphylaxis    Contrast Dye   . Codeine   . Iohexol      Desc: UNABLE TO BREATH   . Other Other (See Comments)    Cashews and pistachios; reaction unknown    Patient Active Problem List   Diagnosis Date Noted  . Yeast vaginitis 09/09/2017  . Community acquired pneumonia of left lower lobe of lung (HCC) 09/01/2017  . Moderate persistent asthma with acute exacerbation 09/01/2017  . Paresthesias 08/01/2017  . 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 Asthma   . Hyperlipemia   . Hypertension   . Seizures (HCC)     Past Surgical History:  Procedure Laterality Date  . ABDOMINAL HYSTERECTOMY    . CHOLECYSTECTOMY      Social History   Socioeconomic  History  . Marital status: Widowed    Spouse name: Not on file  . Number of children: 2  . Years of education: 3412  . Highest education level: Not on file  Social Needs  . Financial resource strain: Not on file  . Food insecurity - worry: Not on file  . Food insecurity - inability: Not on file  . Transportation needs - medical: Not on file  . Transportation needs - non-medical: Not on file  Occupational History  . Not on file  Tobacco Use  . Smoking status: Former Games developermoker  . Smokeless tobacco: Never Used  Substance and Sexual Activity  . Alcohol  use: Yes    Alcohol/week: 1.2 oz    Types: 2 Cans of beer per week    Comment: beer, liquor  . Drug use: No  . Sexual activity: Yes    Birth control/protection: Surgical  Other Topics Concern  . Not on file  Social History Narrative   Patient is widowed.   Patient has 2 children.   Patient is currently unemployed.   Patient has a high school education.   Patient is right-handed.   Patient drinks 2 glass of caffeine daily ( soda and tea).    Family History  Problem Relation Age of Onset  . Breast cancer Mother   . Hypertension Mother   . Heart failure Father   . Hypertension Father   . Hyperlipidemia Father      Review of Systems  Constitutional: Negative for chills and fever.  Gastrointestinal: Negative for abdominal pain, nausea and vomiting.  Genitourinary: Negative for dysuria.  Skin: Negative for rash.  Neurological: Negative for dizziness and headaches.  All other systems reviewed and are negative.  Vitals:   09/09/17 1147  BP: 110/80  Pulse: 85  Resp: 12  Temp: 97.8 F (36.6 C)  SpO2: 95%     Physical Exam  Constitutional: She is oriented to person, place, and time. She appears well-developed and well-nourished.  HENT:  Head: Normocephalic and atraumatic.  Eyes: Pupils are equal, round, and reactive to light.  Neck: Normal range of motion.  Cardiovascular: Normal rate.  Pulmonary/Chest: Effort normal.  Musculoskeletal: Normal range of motion.  Neurological: She is alert and oriented to person, place, and time.  Skin: Skin is warm.  Psychiatric: She has a normal mood and affect. Her behavior is normal.  Vitals reviewed.    ASSESSMENT & PLAN: [name] was seen today for thrush and vaginitis.  Diagnoses and all orders for this visit:  Yeast vaginitis -     fluconazole (DIFLUCAN) 150 MG tablet; Take 1 tablet (150 mg total) by mouth once for 1 dose. Repeat in 5-7 days.    Patient Instructions  Vaginal Yeast infection, Adult Vaginal yeast  infection is a condition that causes soreness, swelling, and redness (inflammation) of the vagina. It also causes vaginal discharge. This is a common condition. Some women get this infection frequently. What are the causes? This condition is caused by a change in the normal balance of the yeast (candida) and bacteria that live in the vagina. This change causes an overgrowth of yeast, which causes the inflammation. What increases the risk? This condition is more likely to develop in:  Women who take antibiotic medicines.  Women who have diabetes.  Women who take birth control pills.  Women who are pregnant.  Women who douche often.  Women who have a weak defense (immune) system.  Women who have been taking steroid medicines for a  long time.  Women who frequently wear tight clothing.  What are the signs or symptoms? Symptoms of this condition include:  White, thick vaginal discharge.  Swelling, itching, redness, and irritation of the vagina. The lips of the vagina (vulva) may be affected as well.  Pain or a burning feeling while urinating.  Pain during sex.  How is this diagnosed? This condition is diagnosed with a medical history and physical exam. This will include a pelvic exam. Your health care provider will examine a sample of your vaginal discharge under a microscope. Your health care provider may send this sample for testing to confirm the diagnosis. How is this treated? This condition is treated with medicine. Medicines may be over-the-counter or prescription. You may be told to use one or more of the following:  Medicine that is taken orally.  Medicine that is applied as a cream.  Medicine that is inserted directly into the vagina (suppository).  Follow these instructions at home:  Take or apply over-the-counter and prescription medicines only as told by your health care provider.  Do not have sex until your health care provider has approved. Tell your sex  partner that you have a yeast infection. That person should go to his or her health care provider if he or she develops symptoms.  Do not wear tight clothes, such as pantyhose or tight pants.  Avoid using tampons until your health care provider approves.  Eat more yogurt. This may help to keep your yeast infection from returning.  Try taking a sitz bath to help with discomfort. This is a warm water bath that is taken while you are sitting down. The water should only come up to your hips and should cover your buttocks. Do this 3-4 times per day or as told by your health care provider.  Do not douche.  Wear breathable, cotton underwear.  If you have diabetes, keep your blood sugar levels under control. Contact a health care provider if:  You have a fever.  Your symptoms go away and then return.  Your symptoms do not get better with treatment.  Your symptoms get worse.  You have new symptoms.  You develop blisters in or around your vagina.  You have blood coming from your vagina and it is not your menstrual period.  You develop pain in your abdomen. This information is not intended to replace advice given to you by your health care provider. Make sure you discuss any questions you have with your health care provider. Document Released: 06/08/2005 Document Revised: 02/10/2016 Document Reviewed: 03/02/2015 Elsevier Interactive Patient Education  2018 Elsevier Inc.      Edwina Barth, MD [facility]

## 2017-09-09 NOTE — Patient Instructions (Signed)

## 2017-10-16 ENCOUNTER — Other Ambulatory Visit: Payer: Self-pay | Admitting: Family Medicine

## 2017-10-16 DIAGNOSIS — I10 Essential (primary) hypertension: Secondary | ICD-10-CM

## 2017-11-09 ENCOUNTER — Other Ambulatory Visit: Payer: Self-pay | Admitting: Family Medicine

## 2017-11-09 DIAGNOSIS — E785 Hyperlipidemia, unspecified: Secondary | ICD-10-CM

## 2017-11-10 NOTE — Telephone Encounter (Signed)
Lipitor refill request  LOV 05/11/17 with Lynelle DoctorGreene  [facility] [address] - [place], [place]  [address]

## 2017-11-10 NOTE — Telephone Encounter (Signed)
Called pt and spoke to her about the RX being called in. I reminded her that she will need to make a follow up before Dr. Neva SeatGreene can do any other refills. She made an appt for 11/14/17 with Dr. Neva SeatGreene

## 2017-11-14 ENCOUNTER — Encounter: Payer: Self-pay | Admitting: Family Medicine

## 2017-11-14 ENCOUNTER — Ambulatory Visit: Payer: BLUE CROSS/BLUE SHIELD | Admitting: Family Medicine

## 2017-11-14 VITALS — BP 123/84 | HR 80 | Temp 97.6°F | Resp 18 | Ht 66.75 in | Wt 175.0 lb

## 2017-11-14 DIAGNOSIS — J45909 Unspecified asthma, uncomplicated: Secondary | ICD-10-CM

## 2017-11-14 DIAGNOSIS — M79605 Pain in left leg: Secondary | ICD-10-CM | POA: Diagnosis not present

## 2017-11-14 DIAGNOSIS — R05 Cough: Secondary | ICD-10-CM | POA: Diagnosis not present

## 2017-11-14 DIAGNOSIS — I10 Essential (primary) hypertension: Secondary | ICD-10-CM | POA: Diagnosis not present

## 2017-11-14 DIAGNOSIS — F418 Other specified anxiety disorders: Secondary | ICD-10-CM | POA: Diagnosis not present

## 2017-11-14 DIAGNOSIS — R059 Cough, unspecified: Secondary | ICD-10-CM

## 2017-11-14 DIAGNOSIS — E785 Hyperlipidemia, unspecified: Secondary | ICD-10-CM

## 2017-11-14 DIAGNOSIS — G6289 Other specified polyneuropathies: Secondary | ICD-10-CM | POA: Diagnosis not present

## 2017-11-14 MED ORDER — LISINOPRIL-HYDROCHLOROTHIAZIDE 20-25 MG PO TABS: 1.0000 | ORAL_TABLET | Freq: Every day | ORAL | 1 refills | Status: DC

## 2017-11-14 MED ORDER — ALBUTEROL SULFATE HFA 108 (90 BASE) MCG/ACT IN AERS: 2.0000 | INHALATION_SPRAY | Freq: Four times a day (QID) | RESPIRATORY_TRACT | 0 refills | Status: DC | PRN

## 2017-11-14 MED ORDER — ATORVASTATIN CALCIUM 10 MG PO TABS: ORAL_TABLET | ORAL | 1 refills | Status: DC

## 2017-11-14 MED ORDER — SERTRALINE HCL 50 MG PO TABS: 50.0000 mg | ORAL_TABLET | Freq: Every day | ORAL | 1 refills | Status: DC

## 2017-11-14 MED ORDER — FLUTICASONE PROPIONATE HFA 44 MCG/ACT IN AERO: 1.0000 | INHALATION_SPRAY | Freq: Two times a day (BID) | RESPIRATORY_TRACT | 6 refills | Status: DC

## 2017-11-14 NOTE — Progress Notes (Signed)
Subjective:  By signing my name below, I, [name], attest that this documentation has been prepared under the direction and in the presence of [name], MD Electronically Signed: Charline Bills, ED Scribe 11/14/2017 at 4:08 PM.   Patient ID: [name], female    DOB: 27-Oct-1965, 52 y.o.   MRN: [id number]  Chief Complaint  Patient presents with  . Leg Pain    Follow up left leg  . Medication Refill    zoloft,   . Tingling    follow up bilateral feet   HPI [name] is a 52 y.o. female who presents to Primary Care at [facility] for f/u of multiple issues.  Hyperlipidemia Lab Results  Component Value Date   CHOL 192 05/11/2017   HDL 45 05/11/2017   LDLCALC 80 05/11/2017   TRIG 337 (H) 05/11/2017   CHOLHDL 4.3 05/11/2017   Lab Results  Component Value Date   ALT 44 (H) 03/23/2017   AST 37 03/23/2017   ALKPHOS 110 03/23/2017   BILITOT 0.3 03/23/2017  Lipitor 10 mg qd. Was tolerating that medicine in Aug. She ran out of medication 2 days ago. Denies side-effects. Pt last ate around 12:30 PM today.  Lab Results  Component Value Date   CHOL 192 05/11/2017   HDL 45 05/11/2017   LDLCALC 80 05/11/2017   TRIG 337 (H) 05/11/2017   CHOLHDL 4.3 05/11/2017   Lab Results  Component Value Date   ALT 44 (H) 03/23/2017   AST 37 03/23/2017   ALKPHOS 110 03/23/2017   BILITOT 0.3 03/23/2017   Depression/Anxiety Did have some anhedonia at last visit. Had cut back on alcohol consumption. Restarted Zoloft 50 mg qd. States she doesn't participate in hobbies but she does spend time with her family which she enjoys. She does feel like her mood has improved with medication. Denies side-effects. Pt states that she has cut back to 3-4 drinks containing liquor/day but she is still drinking beer.  Asthma Flovent inhaler added in Aug due to continued albuterol need. Pt is taking Flovent PRN. States some wks she doesn't use it at all but some wks she uses it more often with new  flowers bloom.  Leg Pain/Tingling Pt reports intermittent leg pain and tingling on the bottom of both feet, L worse than R, for over a yr. She reports that pain radiates from her L buttock to her L leg and down to her feet. States symptoms were tolerable for 3 wks but then returned. She has been wearing compression stockings without any relief. Symptoms have been evaluated by neuro.  Patient Active Problem List   Diagnosis Date Noted  . Yeast vaginitis 09/09/2017  . Community acquired pneumonia of left lower lobe of lung (HCC) 09/01/2017  . Moderate persistent asthma with acute exacerbation 09/01/2017  . Paresthesias 08/01/2017  . 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 Asthma   . Hyperlipemia   . Hypertension   . Seizures (HCC)    Past Surgical History:  Procedure Laterality Date  . ABDOMINAL HYSTERECTOMY    . CHOLECYSTECTOMY     Allergies  Allergen Reactions  . Contrast Media [Iodinated Diagnostic Agents] Anaphylaxis    Contrast Dye   . Codeine   . Iohexol      Desc: UNABLE TO BREATH   . Other Other (See Comments)    Cashews and pistachios; reaction unknown  Prior to Admission medications   Medication Sig Start Date End Date Taking? Authorizing Provider  albuterol (PROVENTIL HFA;VENTOLIN HFA) 108 (90 Base) MCG/ACT inhaler Inhale 2 puffs into the lungs every 6 (six) hours as needed for wheezing or shortness of breath. 03/23/17   Shade FloodGreene, Yanisa Goodgame R, MD  atorvastatin (LIPITOR) 10 MG tablet TAKE 1 TABLET BY MOUTH ONCE DAILY AT  6  PM 11/10/17   Shade FloodGreene, Kodie Pick R, MD  benzonatate (TESSALON) 200 MG capsule Take 1 capsule (200 mg total) by mouth 3 (three) times daily as needed for cough. Patient not taking: Reported on 09/09/2017 09/01/17   Sunnie NielsenAlexander, Natalie, DO  chlorpheniramine-HYDROcodone (TUSSIONEX) 10-8 MG/5ML SUER Take 5 mLs by mouth every 12 (twelve) hours as needed for cough (note:  will cause drowsiness.). Patient not taking: Reported on 09/09/2017 09/01/17   Sunnie NielsenAlexander, Natalie, DO  cyclobenzaprine (FLEXERIL) 5 MG tablet Take 1 tablet (5 mg total) by mouth 3 (three) times daily as needed for muscle spasms. 04/14/17   Benjiman CoreWiseman, Brittany D, PA-C  esomeprazole (NEXIUM) 20 MG packet Take 20 mg by mouth daily before breakfast.    [provider]  Fish Oil-Cholecalciferol (FISH OIL + D3) 1000-1000 MG-UNIT CAPS Take by mouth.    [provider]  fluticasone (FLOVENT HFA) 44 MCG/ACT inhaler Inhale 1-2 puffs into the lungs 2 (two) times daily. 05/11/17   Shade FloodGreene, Maleeah Crossman R, MD  levETIRAcetam (KEPPRA) 500 MG tablet TAKE ONE TABLET BY MOUTH IN THE MORNING AND TWO TABLETS AT BEDTIME 08/01/17   Nilda RiggsMartin, Nancy Carolyn, NP  lisinopril-hydrochlorothiazide (PRINZIDE,ZESTORETIC) 20-25 MG tablet TAKE 1 TABLET BY MOUTH ONCE DAILY 10/16/17   Shade FloodGreene, Millie Shorb R, MD  Multiple Vitamin (MULTIVITAMIN) tablet Take 1 tablet by mouth daily.    [provider]  naproxen (NAPROSYN) 500 MG tablet Take 1 tablet (500 mg total) by mouth 2 (two) times daily with a meal. 04/14/17   Barnett AbuWiseman, GrenadaBrittany D, PA-C  sertraline (ZOLOFT) 50 MG tablet Take 1 tablet (50 mg total) by mouth daily. 05/11/17   Shade FloodGreene, Dejanay Wamboldt R, MD   Social History   Socioeconomic History  . Marital status: Widowed    Spouse name: Not on file  . Number of children: 2  . Years of education: 912  . Highest education level: Not on file  Social Needs  . Financial resource strain: Not on file  . Food insecurity - worry: Not on file  . Food insecurity - inability: Not on file  . Transportation needs - medical: Not on file  . Transportation needs - non-medical: Not on file  Occupational History  . Not on file  Tobacco Use  . Smoking status: Former Games developermoker  . Smokeless tobacco: Never Used  Substance and Sexual Activity  . Alcohol use: Yes    Alcohol/week: 1.2 oz    Types: 2 Cans of beer per week    Comment: beer, liquor    . Drug use: No  . Sexual activity: Yes    Birth control/protection: Surgical  Other Topics Concern  . Not on file  Social History Narrative   Patient is widowed.   Patient has 2 children.   Patient is currently unemployed.   Patient has a high school education.   Patient is right-handed.   Patient drinks 2 glass of caffeine daily ( soda and tea).   Review of Systems  Musculoskeletal: Positive for myalgias.  Neurological: Positive for numbness (tingling).  Psychiatric/Behavioral: Negative for dysphoric mood.      Objective:   Physical Exam  Constitutional: She is oriented to person, place, and time. She appears well-developed and well-nourished.  HENT:  Head: Normocephalic and atraumatic.  Eyes: Conjunctivae and EOM are normal. Pupils are equal, round, and reactive to light.  Neck: Carotid bruit is not present.  Cardiovascular: Normal rate, regular rhythm, normal heart sounds and intact distal pulses.  Faint but palpable DP pulses.  Pulmonary/Chest: Effort normal and breath sounds normal.  Abdominal: Soft. She exhibits no pulsatile midline mass. There is no tenderness.  Musculoskeletal:  Pain free hip ROM. Neg seated straight leg raise. Sensation in tact.  Neurological: She is alert and oriented to person, place, and time.  Skin: Skin is warm and dry.  Psychiatric: She has a normal mood and affect. Her behavior is normal.  Vitals reviewed.  Vitals:   11/14/17 1532  BP: 123/84  Pulse: 80  Resp: 18  Temp: 97.6 F (36.4 C)  TempSrc: Oral  SpO2: 96%  Weight: 175 lb (79.4 kg)  Height: 5' 6.75" (1.695 m)      Assessment & Plan:   KENZY CAMPOVERDE is a 52 y.o. female Left leg pain Other polyneuropathy - Plan: TSH, Vitamin B12  - Possible radiculopathy from lumbar source with history of peripheral neuropathy. Check TSH, B12. She plans to follow-up to discuss this further, RTC precautions if acutely worsening  Hyperlipidemia, unspecified hyperlipidemia type - Plan:  atorvastatin (LIPITOR) 10 MG tablet, Comprehensive metabolic panel  -Tolerating Lipitor, out for 2 days. labs pending. Not truly fasting, so may need to repeat testing if elevated. No change in dose for now  Depression with anxiety - Plan: sertraline (ZOLOFT) 50 MG tablet  -Improved symptoms with Zoloft 50 mg. Recommended to continue to cut back on alcohol, and if she notices that she is drinking due to anxiety or depression symptoms would recommend increasing Zoloft 100 mg.   Essential hypertension - Plan: lisinopril-hydrochlorothiazide (PRINZIDE,ZESTORETIC) 20-25 MG tablet  -Stable, continue same dose lisinopril HCTZ  Cough - Plan: albuterol (PROVENTIL HFA;VENTOLIN HFA) 108 (90 Base) MCG/ACT inhaler, fluticasone (FLOVENT HFA) 44 MCG/ACT inhaler Asthma, unspecified asthma severity, unspecified whether complicated, unspecified whether persistent - Plan: albuterol (PROVENTIL HFA;VENTOLIN HFA) 108 (90 Base) MCG/ACT inhaler  -Controlled, continue Flovent as maintenance, albuterol as needed.  Meds ordered this encounter  Medications  . atorvastatin (LIPITOR) 10 MG tablet    Sig: TAKE 1 TABLET BY MOUTH ONCE DAILY AT  6  PM    Dispense:  90 tablet    Refill:  1  . sertraline (ZOLOFT) 50 MG tablet    Sig: Take 1 tablet (50 mg total) by mouth daily.    Dispense:  90 tablet    Refill:  1  . lisinopril-hydrochlorothiazide (PRINZIDE,ZESTORETIC) 20-25 MG tablet    Sig: Take 1 tablet by mouth daily.    Dispense:  90 tablet    Refill:  1  . albuterol (PROVENTIL HFA;VENTOLIN HFA) 108 (90 Base) MCG/ACT inhaler    Sig: Inhale 2 puffs into the lungs every 6 (six) hours as needed for wheezing or shortness of breath.    Dispense:  1 Inhaler    Refill:  0  . fluticasone (FLOVENT HFA) 44 MCG/ACT inhaler    Sig: Inhale 1-2 puffs into the lungs 2 (two) times daily.    Dispense:  1 Inhaler    Refill:  6   Patient Instructions   I will check some bloodwork today, but will need to check fasting labs next  time. Restart cholesterol and follow up in next 6  weeks for fasting labs.   For asthma - flovent twice per day every day for maintenance, then albuterol for rescue inhaler if needed. Once asthma improves, can try to decrease to just albuterol as needed.   No change in blood pressure or cholesterol medication today.  Good work on cutting back on alcohol. Would continue to try to work on cutting back to no more than 1-2 drinks per day. Continue same dose of Zoloft, but we can discuss at next visit in 6 weeks if it may be helpful to increase that to 100 mg.  I will check some blood work for the tingling in your feet but follow-up in 6 weeks to discuss that further as well as  leg symptoms. If any worsening symptoms prior, please return sooner.  Thank you for coming in today.   IF you received an x-ray today, you will receive an invoice from [facility]. Please contact [facility] at [phone] with questions or concerns regarding your invoice.   IF you received labwork today, you will receive an invoice from [facility]. Please contact [facility] at [phone] with questions or concerns regarding your invoice.   Our billing staff will not be able to assist you with questions regarding bills from these companies.  You will be contacted with the lab results as soon as they are available. The fastest way to get your results is to activate your My Chart account. Instructions are located on the last page of this paperwork. If you have not heard from [place] regarding the results in 2 weeks, please contact this office.      I personally performed the services described in this documentation, which was scribed in my presence. The recorded information has been reviewed and considered for accuracy and completeness, addended by me as needed, and agree with information above.  Signed,   [name], MD Primary Care at [facility].  11/17/17 2:52 PM

## 2017-11-14 NOTE — Patient Instructions (Addendum)
I will check some bloodwork today, but will need to check fasting labs next time. Restart cholesterol and follow up in next 6 weeks for fasting labs.   For asthma - flovent twice per day every day for maintenance, then albuterol for rescue inhaler if needed. Once asthma improves, can try to decrease to just albuterol as needed.   No change in blood pressure or cholesterol medication today.  Good work on cutting back on alcohol. Would continue to try to work on cutting back to no more than 1-2 drinks per day. Continue same dose of Zoloft, but we can discuss at next visit in 6 weeks if it may be helpful to increase that to 100 mg.  I will check some blood work for the tingling in your feet but follow-up in 6 weeks to discuss that further as well as  leg symptoms. If any worsening symptoms prior, please return sooner.  Thank you for coming in today.   IF you received an x-ray today, you will receive an invoice from [facility]. Please contact [facility] at [phone] with questions or concerns regarding your invoice.   IF you received labwork today, you will receive an invoice from [facility]. Please contact [facility] at [phone] with questions or concerns regarding your invoice.   Our billing staff will not be able to assist you with questions regarding bills from these companies.  You will be contacted with the lab results as soon as they are available. The fastest way to get your results is to activate your My Chart account. Instructions are located on the last page of this paperwork. If you have not heard from us regarding the results in 2 weeks, please contact this office.

## 2017-11-15 LAB — COMPREHENSIVE METABOLIC PANEL
A/G RATIO: 1.8 (ref 1.2–2.2)
ALBUMIN: 4.9 g/dL (ref 3.5–5.5)
ALK PHOS: 176 IU/L — AB (ref 39–117)
ALT: 91 IU/L — ABNORMAL HIGH (ref 0–32)
AST: 71 IU/L — ABNORMAL HIGH (ref 0–40)
BILIRUBIN TOTAL: 0.4 mg/dL (ref 0.0–1.2)
BUN / CREAT RATIO: 17 (ref 9–23)
BUN: 9 mg/dL (ref 6–24)
CHLORIDE: 92 mmol/L — AB (ref 96–106)
CO2: 26 mmol/L (ref 20–29)
Calcium: 10.2 mg/dL (ref 8.7–10.2)
Creatinine, Ser: 0.52 mg/dL — ABNORMAL LOW (ref 0.57–1.00)
GFR calc non Af Amer: 111 mL/min/{1.73_m2} (ref >59)
GFR, EST AFRICAN AMERICAN: 128 mL/min/{1.73_m2} (ref >59)
GLOBULIN, TOTAL: 2.8 g/dL (ref 1.5–4.5)
Glucose: 152 mg/dL — ABNORMAL HIGH (ref 65–99)
Potassium: 4.2 mmol/L (ref 3.5–5.2)
SODIUM: 136 mmol/L (ref 134–144)
TOTAL PROTEIN: 7.7 g/dL (ref 6.0–8.5)

## 2017-11-15 LAB — TSH: TSH: 1.62 u[IU]/mL (ref 0.450–4.500)

## 2017-11-15 LAB — VITAMIN B12: Vitamin B-12: 904 pg/mL (ref 232–1245)

## 2017-11-17 ENCOUNTER — Encounter: Payer: Self-pay | Admitting: Family Medicine

## 2017-11-28 ENCOUNTER — Encounter: Payer: Self-pay | Admitting: *Deleted

## 2017-11-30 ENCOUNTER — Telehealth: Payer: Self-pay | Admitting: *Deleted

## 2017-11-30 NOTE — Telephone Encounter (Signed)
Called [facility] pharmacy re: fax received levetiracetam on back order, spoke with Selena BattenKim. She stated the medication is on back order. She checked and the last time the patient refilled it was on 09/01/17 for 7 days. Prior to that she filled it on  Jun 30, 2017 for 30 days. This RN will call patient.  LVM for patient advising her that Cephus SlaterKim, [facility] stated she last picked up only 7 days of levetiracetam on 09/01/17. Advised her this RN needs to know if she is taking the medication and where she is refilling it. Left office number.

## 2017-11-30 NOTE — Telephone Encounter (Addendum)
Called patient who stated [facility] to discuss. The pharm tech she spoke with saw that she is auto refill. Patient stated she only has  10 days doses remaining. This RN advised she call other pharmacies pot have a [facility] She verbalized understanding, appreciation. Advised she call for any problems.

## 2017-11-30 NOTE — Telephone Encounter (Signed)
Pt returned RN's call. [facility] Pt said she has auto refill. Last refill date on her bottle is 10/30/17 by Darrol Angelarolyn Martin. Pt states she has not missed any doses. Please call to advise

## 2018-01-01 ENCOUNTER — Encounter: Payer: Self-pay | Admitting: Family Medicine

## 2018-01-01 ENCOUNTER — Other Ambulatory Visit: Payer: Self-pay

## 2018-01-01 ENCOUNTER — Ambulatory Visit: Payer: BLUE CROSS/BLUE SHIELD | Admitting: Family Medicine

## 2018-01-01 VITALS — BP 130/88 | HR 80 | Temp 97.6°F | Ht 66.5 in | Wt 172.0 lb

## 2018-01-01 DIAGNOSIS — R1011 Right upper quadrant pain: Secondary | ICD-10-CM | POA: Diagnosis not present

## 2018-01-01 DIAGNOSIS — R945 Abnormal results of liver function studies: Secondary | ICD-10-CM | POA: Diagnosis not present

## 2018-01-01 DIAGNOSIS — R1013 Epigastric pain: Secondary | ICD-10-CM

## 2018-01-01 DIAGNOSIS — R350 Frequency of micturition: Secondary | ICD-10-CM

## 2018-01-01 DIAGNOSIS — E1165 Type 2 diabetes mellitus with hyperglycemia: Secondary | ICD-10-CM | POA: Diagnosis not present

## 2018-01-01 DIAGNOSIS — R739 Hyperglycemia, unspecified: Secondary | ICD-10-CM | POA: Diagnosis not present

## 2018-01-01 DIAGNOSIS — R112 Nausea with vomiting, unspecified: Secondary | ICD-10-CM

## 2018-01-01 DIAGNOSIS — R7989 Other specified abnormal findings of blood chemistry: Secondary | ICD-10-CM

## 2018-01-01 LAB — POCT CBC
GRANULOCYTE PERCENT: 81.2 % — AB (ref 37–80)
HCT, POC: 40.8 % (ref 37.7–47.9)
Hemoglobin: 13.6 g/dL (ref 12.2–16.2)
Lymph, poc: 1.5 (ref 0.6–3.4)
MCH: 29.8 pg (ref 27–31.2)
MCHC: 33.4 g/dL (ref 31.8–35.4)
MCV: 89.2 fL (ref 80–97)
MID (cbc): 0.2 (ref 0–0.9)
MPV: 8.1 fL (ref 0–99.8)
PLATELET COUNT, POC: 271 10*3/uL (ref 142–424)
POC Granulocyte: 7.3 — AB (ref 2–6.9)
POC LYMPH PERCENT: 16.9 %L (ref 10–50)
POC MID %: 1.9 %M (ref 0–12)
RBC: 4.57 M/uL (ref 4.04–5.48)
RDW, POC: 12.3 %
WBC: 9 10*3/uL (ref 4.6–10.2)

## 2018-01-01 LAB — POC MICROSCOPIC URINALYSIS (UMFC): Mucus: ABSENT

## 2018-01-01 LAB — POCT GLYCOSYLATED HEMOGLOBIN (HGB A1C): Hemoglobin A1C: 8.2

## 2018-01-01 LAB — POCT URINALYSIS DIP (MANUAL ENTRY)
Bilirubin, UA: NEGATIVE
Glucose, UA: NEGATIVE mg/dL
Ketones, POC UA: NEGATIVE mg/dL
Leukocytes, UA: NEGATIVE
Nitrite, UA: NEGATIVE
PH UA: 5.5 (ref 5.0–8.0)
PROTEIN UA: NEGATIVE mg/dL
SPEC GRAV UA: 1.025 (ref 1.010–1.025)
UROBILINOGEN UA: 0.2 U/dL

## 2018-01-01 LAB — GLUCOSE, POCT (MANUAL RESULT ENTRY): POC GLUCOSE: 178 mg/dL — AB (ref 70–99)

## 2018-01-01 MED ORDER — EMPAGLIFLOZIN 10 MG PO TABS: 10.0000 mg | ORAL_TABLET | Freq: Every day | ORAL | 2 refills | Status: DC

## 2018-01-01 MED ORDER — BLOOD GLUCOSE METER KIT: PACK | 0 refills | Status: AC

## 2018-01-01 NOTE — Patient Instructions (Addendum)
Blood sugar is in diabetes range, but not dangerous level today.  Start Jardiance once per day for now, then recheck for blood sugar in the next 2 weeks.  I will also provide a monitor for you to check home readings once per day at either fasting or 2 hours after meals.  Please bring a record of those readings to next visit.  See other information on diabetes below.  Blood count was borderline elevated today, but still in normal range from a standpoint of infection.  I will check liver tests, pancreas test, and other tests to check into cause of pain, but would recommend evaluation with gastroenterology as soon as possible to discuss the pain and diarrhea.  Let me know if you need another referral.  Continue proton pump inhibitor once per day and recheck in the next 1 to 2 weeks, sooner if that pain worsens (be seen right away if ANY worsening). Return to the clinic or go to the nearest emergency room if any of your symptoms worsen or new symptoms occur.  As we discussed the numbness and  leg pain may be related to pinched nerve or degenerative changes in the back, but for now we will hold on anti-inflammatories as this may worsen your abdominal symptoms or blood sugar.   If that pain worsens, return to discuss acute treatment. Return to the clinic or go to the nearest emergency room if any of your symptoms worsen or new symptoms occur.   Type 2 Diabetes Mellitus, Self Care, Adult When you have type 2 diabetes (type 2 diabetes mellitus), you must keep your blood sugar (glucose) under control. You can do this with:  Nutrition.  Exercise.  Lifestyle changes.  Medicines or insulin, if needed.  Support from your doctors and others.  How do I manage my blood sugar?  Check your blood sugar level every day, as often as told.  Call your doctor if your blood sugar is above your goal numbers for 2 tests in a row.  Have your A1c (hemoglobin A1c) level checked at least two times a year. Have it  checked more often if your doctor tells you to. Your doctor will set treatment goals for you. Generally, you should have these blood sugar levels:  Before meals (preprandial): 80-130 mg/dL (4.4-7.2 mmol/L).  After meals (postprandial): lower than 180 mg/dL (10 mmol/L).  A1c level: less than 7%.  What do I need to know about high blood sugar? High blood sugar is called hyperglycemia. Know the signs of high blood sugar. Signs may include:  Feeling: ? Thirsty. ? Hungry. ? Very tired.  Needing to pee (urinate) more than usual.  Blurry vision.  What do I need to know about low blood sugar? Low blood sugar is called hypoglycemia. This is when blood sugar is at or below 70 mg/dL (3.9 mmol/L). Symptoms may include:  Feeling: ? Hungry. ? Worried or nervous (anxious). ? Sweaty and clammy. ? Confused. ? Dizzy. ? Sleepy. ? Sick to your stomach (nauseous).  Having: ? A fast heartbeat (palpitations). ? A headache. ? A change in your vision. ? Jerky movements that you cannot control (seizure). ? Nightmares. ? Tingling or no feeling (numbness) around the mouth, lips, or tongue.  Having trouble with: ? Talking. ? Paying attention (concentrating). ? Moving (coordination). ? Sleeping.  Shaking.  Passing out (fainting).  Getting upset easily (irritability).  Treating low blood sugar  To treat low blood sugar, eat or drink something sugary right away. If you can  think clearly and swallow safely, follow the 15:15 rule:  Take 15 grams of a fast-acting carb (carbohydrate). Some fast-acting carbs are: ? 1 tube of glucose gel. ? 3 sugar tablets (glucose pills). ? 6-8 pieces of hard candy. ? 4 oz (120 mL) of fruit juice. ? 4 oz (120 mL) regular (not diet) soda.  Check your blood sugar 15 minutes after you take the carb.  If your blood sugar is still at or below 70 mg/dL (3.9 mmol/L), take 15 grams of a carb again.  If your blood sugar does not go above 70 mg/dL (3.9 mmol/L)  after 3 tries, get help right away.  After your blood sugar goes back to normal, eat a meal or a snack within 1 hour.  Treating very low blood sugar If your blood sugar is at or below 54 mg/dL (3 mmol/L), you have very low blood sugar (severe hypoglycemia). This is an emergency. Do not wait to see if the symptoms will go away. Get medical help right away. Call your local emergency services (911 in the [place] Do not drive yourself to the hospital. If you have very low blood sugar and you cannot eat or drink, you may need a glucagon shot (injection). A family member or friend should learn how to check your blood sugar and how to give you a glucagon shot. Ask your doctor if you need to have a glucagon shot kit at home. What else is important to manage my diabetes? Medicine Follow these instructions about insulin and diabetes medicines:  Take them as told by your doctor.  Adjust them as told by your doctor.  Do not run out of them.  Having diabetes can raise your risk for other long-term conditions. These include heart or kidney disease. Your doctor may prescribe medicines to help prevent problems from diabetes. Food   Make healthy food choices. These include: ? Chicken, fish, egg whites, and beans. ? Oats, whole wheat, bulgur, brown rice, quinoa, and millet. ? Fresh fruits and vegetables. ? Low-fat dairy products. ? Nuts, avocado, olive oil, and canola oil.  Make a food plan with a specialist (dietitian).  Follow instructions from your doctor about what you cannot eat or drink.  Drink enough fluid to keep your pee (urine) clear or pale yellow.  Eat healthy snacks between healthy meals.  Keep track of carbs that you eat. Read food labels. Learn food serving sizes.  Follow your sick day plan when you cannot eat or drink normally. Make this plan with your doctor so it is ready to use. Activity  Exercise at least 3 times a week.  Do not go more than 2 days without  exercising.  Talk with your doctor before you start a new exercise. Your doctor may need to adjust your insulin, medicines, or food. Lifestyle   Do not use any tobacco products. These include cigarettes, chewing tobacco, and e-cigarettes.If you need help quitting, ask your doctor.  Ask your doctor how much alcohol is safe for you.  Learn to deal with stress. If you need help with this, ask your doctor. Body care  Stay up to date with your shots (immunizations).  Have your eyes and feet checked by a doctor as often as told.  Check your skin and feet every day. Check for cuts, bruises, redness, blisters, or sores.  Brush your teeth and gums two times a day.  Floss at least one time a day.  Go to the dentist least one time every 6 months.  Stay at a healthy weight. General instructions   Take over-the-counter and prescription medicines only as told by your doctor.  Share your diabetes care plan with: ? Your work or school. ? People you live with.  Check your pee (urine) for ketones: ? When you are sick. ? As told by your doctor.  Carry a card or wear jewelry that says that you have diabetes.  Ask your doctor: ? Do I need to meet with a diabetes educator? ? Where can I find a support group for people with diabetes?  Keep all follow-up visits as told by your doctor. This is important. Where to find more information: To learn more about diabetes, visit:  American Diabetes Association: www.diabetes.org  American Association of Diabetes Educators: www.diabeteseducator.org/patient-resources  This information is not intended to replace advice given to you by your health care provider. Make sure you discuss any questions you have with your health care provider. Document Released: 12/21/2015 Document Revised: 02/04/2016 Document Reviewed: 10/02/2015 Elsevier Interactive Patient Education  2018 [employer].       IF you received an x-ray today, you will receive an  invoice from [facility]. Please contact [facility] at [phone] with questions or concerns regarding your invoice.   IF you received labwork today, you will receive an invoice from [facility]. Please contact [facility] at [phone] with questions or concerns regarding your invoice.   Our billing staff will not be able to assist you with questions regarding bills from these companies.  You will be contacted with the lab results as soon as they are available. The fastest way to get your results is to activate your My Chart account. Instructions are located on the last page of this paperwork. If you have not heard from [place] regarding the results in 2 weeks, please contact this office.

## 2018-01-01 NOTE — Progress Notes (Addendum)
By signing my name below, I, Schuyler Bain, attest that this documentation has been prepared under the direction and in the presence of Dr. Ranell Patrick. Carlota Raspberry.   Electronically Signed: Baldwin [place], Medical Scribe 01/01/2018 at 8:58 AM.  Subjective:    Patient ID: [name], female    DOB: 1966/03/31, 52 y.o.   MRN: [id number] Chief Complaint  Patient presents with  . left leg pain    follow up.. knots have been going down her back (lower back area)   HPI [name] is a 52 y.o. female who presents to Primary Care at [facility] for a follow up of her left leg pain. She was last seen appx 6 weeks ago. She had reported intermittent leg pain with tingling of left more than right foot for over a year. Pain radiated from left buttock down left leg- possible lumbar radiculopathy with peripheral neuropathy possible as well. Normal TSH and normal B12 at last visit. Last lumbar spine imaging in 2011 appeared normal. She denies any trouble urinating. No saddle anaesthesia and no incontinence. She notes that her leg pain has not been extremely bothersome recently and has been stable.   Hyperglycemia elevated readings last few times on blood work. She had an A1C of 6.3 last year. She notes that she has been drinking much more water than usual. She also notes that her eyes have been more blurry over the last several months. She notes that the frequency of her urinating has increased as well.   Elevated LFTs: Lab Results  Component Value Date   ALT 91 (H) 11/14/2017   AST 71 (H) 11/14/2017   ALKPHOS 176 (H) 11/14/2017   BILITOT 0.4 11/14/2017   Those had increased from AST of 37 ALT of 34. She does drink ETOH but had cut back. She was drinking 3-4 drinks of liquor per day along with beer. Today 01/01/18 she notes that she has been too busy to drink very much. She notes that she has 6-7 beers each week and has cut out liquor.   The pt also notes upper abdominal pain and nausea. She notes that she has  not seen her GI due to concerns of insurance coverage. She denies acutely worsening abdominal pain. She reports chronic diarrhea and occasional vomiting as well.  She takes Nexium qd.  The pt notes that she had a swollen abdomen last night but the swelling has since resolved. She is notably tender of the epigastric area and RUQ. Murphy's sign mildly positive with slight discomfort. On 05/09/17 US Abdomen, liver had an appearance of heterogeneous parenchymal pattern consistent with fatty infiltration and/or hepatocellular disease prominent spleen at 12.9 cm.    Patient Active Problem List   Diagnosis Date Noted  . Yeast vaginitis 09/09/2017  . Community acquired pneumonia of left lower lobe of lung ([place]) 09/01/2017  . Moderate persistent asthma with acute exacerbation 09/01/2017  . Paresthesias 08/01/2017  . 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 Asthma   . Hyperlipemia   . Hypertension   . Seizures ([place])    Past Surgical History:  Procedure Laterality Date  . ABDOMINAL HYSTERECTOMY    . CHOLECYSTECTOMY     Allergies  Allergen Reactions  . Contrast Media [Iodinated Diagnostic Agents] Anaphylaxis    Contrast Dye   . Codeine   . Iohexol      Desc: UNABLE TO BREATH   . Other  Other (See Comments)    Cashews and pistachios; reaction unknown   Prior to Admission medications   Medication Sig Start Date End Date Taking? Authorizing Provider  albuterol (PROVENTIL HFA;VENTOLIN HFA) 108 (90 Base) MCG/ACT inhaler Inhale 2 puffs into the lungs every 6 (six) hours as needed for wheezing or shortness of breath. 11/14/17  Yes [name], MD  atorvastatin (LIPITOR) 10 MG tablet TAKE 1 TABLET BY MOUTH ONCE DAILY AT  6  PM 11/14/17  Yes [name], MD  chlorpheniramine-HYDROcodone (TUSSIONEX) 10-8 MG/5ML SUER Take 5 mLs by mouth every 12 (twelve) hours as needed for cough (note: will cause  drowsiness.). 09/01/17  Yes Emeterio Reeve, DO  esomeprazole (NEXIUM) 20 MG packet Take 20 mg by mouth daily before breakfast.   Yes [provider]  Fish Oil-Cholecalciferol (FISH OIL + D3) 1000-1000 MG-UNIT CAPS Take by mouth.   Yes [provider]  fluticasone (FLOVENT HFA) 44 MCG/ACT inhaler Inhale 1-2 puffs into the lungs 2 (two) times daily. 11/14/17  Yes [name], MD  levETIRAcetam (KEPPRA) 500 MG tablet TAKE ONE TABLET BY MOUTH IN THE MORNING AND TWO TABLETS AT BEDTIME 08/01/17  Yes Dennie Bible, NP  lisinopril-hydrochlorothiazide (PRINZIDE,ZESTORETIC) 20-25 MG tablet Take 1 tablet by mouth daily. 11/14/17  Yes [name], MD  Multiple Vitamin (MULTIVITAMIN) tablet Take 1 tablet by mouth daily.   Yes [provider]  sertraline (ZOLOFT) 50 MG tablet Take 1 tablet (50 mg total) by mouth daily. 11/14/17  Yes [name], MD   Social History   Socioeconomic History  . Marital status: Widowed    Spouse name: Not on file  . Number of children: 2  . Years of education: 93  . Highest education level: Not on file  Occupational History  . Not on file  Social Needs  . Financial resource strain: Not on file  . Food insecurity:    Worry: Not on file    Inability: Not on file  . Transportation needs:    Medical: Not on file    Non-medical: Not on file  Tobacco Use  . Smoking status: Former Research scientist (life sciences)  . Smokeless tobacco: Never Used  Substance and Sexual Activity  . Alcohol use: Yes    Alcohol/week: 1.2 oz    Types: 2 Cans of beer per week    Comment: beer, liquor  . Drug use: No  . Sexual activity: Yes    Birth control/protection: Surgical  Lifestyle  . Physical activity:    Days per week: Not on file    Minutes per session: Not on file  . Stress: Not on file  Relationships  . Social connections:    Talks on phone: Not on file    Gets together: Not on file    Attends religious service: Not on file    Active member of club  or organization: Not on file    Attends meetings of clubs or organizations: Not on file    Relationship status: Not on file  . Intimate partner violence:    Fear of current or ex partner: Not on file    Emotionally abused: Not on file    Physically abused: Not on file    Forced sexual activity: Not on file  Other Topics Concern  . Not on file  Social History Narrative   Patient is widowed.   Patient has 2 children.   Patient is currently unemployed.   Patient has a high school education.  Patient is right-handed.   Patient drinks 2 glass of caffeine daily ( soda and tea).    Review of Systems  Constitutional: Negative for fatigue.  Eyes: Positive for visual disturbance (Increasingly blurry vision).  Gastrointestinal: Positive for abdominal distention (Intermittent), abdominal pain (RUQ), diarrhea (Occasional), nausea and vomiting (Occasional).  Endocrine: Positive for polydipsia and polyuria.  Genitourinary: Positive for frequency (urination).  Musculoskeletal: Positive for myalgias (Left leg).      Objective:   Physical Exam  Constitutional: She is oriented to person, place, and time. She appears well-developed and well-nourished.  HENT:  Head: Normocephalic and atraumatic.  Cardiovascular: Normal rate, regular rhythm and normal heart sounds. Exam reveals no gallop and no friction rub.  No murmur heard. Pulmonary/Chest: Effort normal and breath sounds normal. No respiratory distress. She has no wheezes. She has no rales.  Abdominal: There is tenderness in the right upper quadrant and epigastric area.  Murphy's sign mildly positive with slight discomfort.   Musculoskeletal:  Negative seated straight leg raise.  Neurological: She is alert and oriented to person, place, and time.  Skin: Skin is warm and dry.  Psychiatric: She has a normal mood and affect. Her behavior is normal.     Vitals:   01/01/18 0845  BP: 130/88  Pulse: 80  Temp: 97.6 F (36.4 C)  TempSrc:  Oral  SpO2: 97%  Weight: 172 lb (78 kg)  Height: 5' 6.5" (1.689 m)    Results for orders placed or performed in visit on 01/01/18  POCT glucose (manual entry)  Result Value Ref Range   POC Glucose 178 (A) 70 - 99 mg/dl  POCT glycosylated hemoglobin (Hb A1C)  Result Value Ref Range   Hemoglobin A1C 8.2   POCT urinalysis dipstick  Result Value Ref Range   Color, UA yellow yellow   Clarity, UA clear clear   Glucose, UA negative negative mg/dL   Bilirubin, UA negative negative   Ketones, POC UA negative negative mg/dL   Spec Grav, UA 1.025 1.010 - 1.025   Blood, UA trace-lysed (A) negative   pH, UA 5.5 5.0 - 8.0   Protein Ur, POC negative negative mg/dL   Urobilinogen, UA 0.2 0.2 or 1.0 E.U./dL   Nitrite, UA Negative Negative   Leukocytes, UA Negative Negative  POCT Microscopic Urinalysis ([facility])  Result Value Ref Range   WBC,UR,HPF,POC None None WBC/hpf   RBC,UR,HPF,POC None None RBC/hpf   Bacteria Many (A) None, Too numerous to count   Mucus Absent Absent   Epithelial Cells, UR Per Microscopy Moderate (A) None, Too numerous to count cells/hpf  POCT CBC  Result Value Ref Range   WBC 9.0 4.6 - 10.2 K/uL   Lymph, poc 1.5 0.6 - 3.4   POC LYMPH PERCENT 16.9 10 - 50 %L   MID (cbc) 0.2 0 - 0.9   POC MID % 1.9 0 - 12 %M   POC Granulocyte 7.3 (A) 2 - 6.9   Granulocyte percent 81.2 (A) 37 - 80 %G   RBC 4.57 4.04 - 5.48 M/uL   Hemoglobin 13.6 12.2 - 16.2 g/dL   HCT, POC 40.8 37.7 - 47.9 %   MCV 89.2 80 - 97 fL   MCH, POC 29.8 27 - 31.2 pg   MCHC 33.4 31.8 - 35.4 g/dL   RDW, POC 12.3 %   Platelet Count, POC 271 142 - 424 K/uL   MPV 8.1 0 - 99.8 fL       Assessment & Plan:  [name] is a 52 y.o. female Abdominal pain, epigastric - Plan: POCT CBC, Lipase RUQ abdominal pain - Plan: Comprehensive metabolic panel, POCT CBC, Hepatitis panel, acute, Hepatitis C antibody Nausea and vomiting, intractability of vomiting not specified, unspecified vomiting type - Plan:  Lipase Elevated LFTs - Plan: Comprehensive metabolic panel, Hepatitis panel, acute, Hepatitis C antibody Recurrent abdominal pain.  With prior alcohol history and elevated LFTs, alcoholic liver disease possible, gastritis possible peptic ulcer disease.    -Recommend gastroenterology follow-up previously, stressed importance of that follow-up again.  Check CMP, acute hepatitis panel, hep C antibody, lipase, and continue PPI for now.  If unable to be seen by her GI in the next 1 to 2 weeks, or any worsening symptoms sooner, RTC precautions/ER precautions discussed.  Minimize alcohol use, continue to cut back.  Hyperglycemia - Plan: POCT glucose (manual entry), POCT glycosylated hemoglobin (Hb A1C) Urinary frequency - Plan: POCT glucose (manual entry), POCT glycosylated hemoglobin (Hb A1C), POCT urinalysis dipstick, POCT Microscopic Urinalysis ([facility]) Type 2 diabetes mellitus with hyperglycemia, without long-term current use of insulin ([place]) - Plan: blood glucose meter kit and supplies, empagliflozin (JARDIANCE) 10 MG TABS tablet  -Hemoglobin A1c and glucose now at level of type 2 diabetes.  Meter prescribed, start Jardiance, hold on metformin for now given other abdominal issues as above.  Recheck next 2 weeks with home readings.  Briefly discussed her leg pain that has been ongoing, possible sciatica/discogenic.  Plan to discuss further next follow-up as above symptoms discussed with priority at current visit.  Meds ordered this encounter  Medications  . blood glucose meter kit and supplies    Sig: Dispense based on patient and insurance preference. Use once per day.    Dispense:  1 each    Refill:  0    Order Specific Question:   Number of strips    Answer:   78    Order Specific Question:   Number of lancets    Answer:   90  . empagliflozin (JARDIANCE) 10 MG TABS tablet    Sig: Take 10 mg by mouth daily.    Dispense:  30 tablet    Refill:  2   Patient Instructions    Blood sugar is in  diabetes range, but not dangerous level today.  Start Jardiance once per day for now, then recheck for blood sugar in the next 2 weeks.  I will also provide a monitor for you to check home readings once per day at either fasting or 2 hours after meals.  Please bring a record of those readings to next visit.  See other information on diabetes below.  Blood count was borderline elevated today, but still in normal range from a standpoint of infection.  I will check liver tests, pancreas test, and other tests to check into cause of pain, but would recommend evaluation with gastroenterology as soon as possible to discuss the pain and diarrhea.  Let me know if you need another referral.  Continue proton pump inhibitor once per day and recheck in the next 1 to 2 weeks, sooner if that pain worsens (be seen right away if ANY worsening). Return to the clinic or go to the nearest emergency room if any of your symptoms worsen or new symptoms occur.  As we discussed the numbness and  leg pain may be related to pinched nerve or degenerative changes in the back, but for now we will hold on anti-inflammatories as this may worsen your abdominal symptoms  or blood sugar.   If that pain worsens, return to discuss acute treatment. Return to the clinic or go to the nearest emergency room if any of your symptoms worsen or new symptoms occur.   Type 2 Diabetes Mellitus, Self Care, Adult When you have type 2 diabetes (type 2 diabetes mellitus), you must keep your blood sugar (glucose) under control. You can do this with:  Nutrition.  Exercise.  Lifestyle changes.  Medicines or insulin, if needed.  Support from your doctors and others.  How do I manage my blood sugar?  Check your blood sugar level every day, as often as told.  Call your doctor if your blood sugar is above your goal numbers for 2 tests in a row.  Have your A1c (hemoglobin A1c) level checked at least two times a year. Have it checked more often if  your doctor tells you to. Your doctor will set treatment goals for you. Generally, you should have these blood sugar levels:  Before meals (preprandial): 80-130 mg/dL (4.4-7.2 mmol/L).  After meals (postprandial): lower than 180 mg/dL (10 mmol/L).  A1c level: less than 7%.  What do I need to know about high blood sugar? High blood sugar is called hyperglycemia. Know the signs of high blood sugar. Signs may include:  Feeling: ? Thirsty. ? Hungry. ? Very tired.  Needing to pee (urinate) more than usual.  Blurry vision.  What do I need to know about low blood sugar? Low blood sugar is called hypoglycemia. This is when blood sugar is at or below 70 mg/dL (3.9 mmol/L). Symptoms may include:  Feeling: ? Hungry. ? Worried or nervous (anxious). ? Sweaty and clammy. ? Confused. ? Dizzy. ? Sleepy. ? Sick to your stomach (nauseous).  Having: ? A fast heartbeat (palpitations). ? A headache. ? A change in your vision. ? Jerky movements that you cannot control (seizure). ? Nightmares. ? Tingling or no feeling (numbness) around the mouth, lips, or tongue.  Having trouble with: ? Talking. ? Paying attention (concentrating). ? Moving (coordination). ? Sleeping.  Shaking.  Passing out (fainting).  Getting upset easily (irritability).  Treating low blood sugar  To treat low blood sugar, eat or drink something sugary right away. If you can think clearly and swallow safely, follow the 15:15 rule:  Take 15 grams of a fast-acting carb (carbohydrate). Some fast-acting carbs are: ? 1 tube of glucose gel. ? 3 sugar tablets (glucose pills). ? 6-8 pieces of hard candy. ? 4 oz (120 mL) of fruit juice. ? 4 oz (120 mL) regular (not diet) soda.  Check your blood sugar 15 minutes after you take the carb.  If your blood sugar is still at or below 70 mg/dL (3.9 mmol/L), take 15 grams of a carb again.  If your blood sugar does not go above 70 mg/dL (3.9 mmol/L) after 3 tries, get help  right away.  After your blood sugar goes back to normal, eat a meal or a snack within 1 hour.  Treating very low blood sugar If your blood sugar is at or below 54 mg/dL (3 mmol/L), you have very low blood sugar (severe hypoglycemia). This is an emergency. Do not wait to see if the symptoms will go away. Get medical help right away. Call your local emergency services (911 in the [place] Do not drive yourself to the hospital. If you have very low blood sugar and you cannot eat or drink, you may need a glucagon shot (injection). A family member or friend should  learn how to check your blood sugar and how to give you a glucagon shot. Ask your doctor if you need to have a glucagon shot kit at home. What else is important to manage my diabetes? Medicine Follow these instructions about insulin and diabetes medicines:  Take them as told by your doctor.  Adjust them as told by your doctor.  Do not run out of them.  Having diabetes can raise your risk for other long-term conditions. These include heart or kidney disease. Your doctor may prescribe medicines to help prevent problems from diabetes. Food   Make healthy food choices. These include: ? Chicken, fish, egg whites, and beans. ? Oats, whole wheat, bulgur, brown rice, quinoa, and millet. ? Fresh fruits and vegetables. ? Low-fat dairy products. ? Nuts, avocado, olive oil, and canola oil.  Make a food plan with a specialist (dietitian).  Follow instructions from your doctor about what you cannot eat or drink.  Drink enough fluid to keep your pee (urine) clear or pale yellow.  Eat healthy snacks between healthy meals.  Keep track of carbs that you eat. Read food labels. Learn food serving sizes.  Follow your sick day plan when you cannot eat or drink normally. Make this plan with your doctor so it is ready to use. Activity  Exercise at least 3 times a week.  Do not go more than 2 days without exercising.  Talk with your doctor  before you start a new exercise. Your doctor may need to adjust your insulin, medicines, or food. Lifestyle   Do not use any tobacco products. These include cigarettes, chewing tobacco, and e-cigarettes.If you need help quitting, ask your doctor.  Ask your doctor how much alcohol is safe for you.  Learn to deal with stress. If you need help with this, ask your doctor. Body care  Stay up to date with your shots (immunizations).  Have your eyes and feet checked by a doctor as often as told.  Check your skin and feet every day. Check for cuts, bruises, redness, blisters, or sores.  Brush your teeth and gums two times a day.  Floss at least one time a day.  Go to the dentist least one time every 6 months.  Stay at a healthy weight. General instructions   Take over-the-counter and prescription medicines only as told by your doctor.  Share your diabetes care plan with: ? Your work or school. ? People you live with.  Check your pee (urine) for ketones: ? When you are sick. ? As told by your doctor.  Carry a card or wear jewelry that says that you have diabetes.  Ask your doctor: ? Do I need to meet with a diabetes educator? ? Where can I find a support group for people with diabetes?  Keep all follow-up visits as told by your doctor. This is important. Where to find more information: To learn more about diabetes, visit:  American Diabetes Association: www.diabetes.org  American Association of Diabetes Educators: www.diabeteseducator.org/patient-resources  This information is not intended to replace advice given to you by your health care provider. Make sure you discuss any questions you have with your health care provider. Document Released: 12/21/2015 Document Revised: 02/04/2016 Document Reviewed: 10/02/2015 Elsevier Interactive Patient Education  2018 [employer].       IF you received an x-ray today, you will receive an invoice from [facility].  Please contact [facility] at [phone] with questions or concerns regarding your invoice.   IF you received  labwork today, you will receive an invoice from [facility]. Please contact [facility] at [phone] with questions or concerns regarding your invoice.   Our billing staff will not be able to assist you with questions regarding bills from these companies.  You will be contacted with the lab results as soon as they are available. The fastest way to get your results is to activate your My Chart account. Instructions are located on the last page of this paperwork. If you have not heard from [place] regarding the results in 2 weeks, please contact this office.       I personally performed the services described in this documentation, which was scribed in my presence. The recorded information has been reviewed and considered for accuracy and completeness, addended by me as needed, and agree with information above.  Signed,   Merri Ray, MD [facility] at [place].  01/03/18 2:28 PM

## 2018-01-02 LAB — COMPREHENSIVE METABOLIC PANEL
A/G RATIO: 1.8 (ref 1.2–2.2)
ALK PHOS: 204 IU/L — AB (ref 39–117)
ALT: 67 IU/L — ABNORMAL HIGH (ref 0–32)
AST: 62 IU/L — AB (ref 0–40)
Albumin: 4.7 g/dL (ref 3.5–5.5)
BUN/Creatinine Ratio: 10 (ref 9–23)
BUN: 6 mg/dL (ref 6–24)
Bilirubin Total: 0.4 mg/dL (ref 0.0–1.2)
CO2: 24 mmol/L (ref 20–29)
CREATININE: 0.62 mg/dL (ref 0.57–1.00)
Calcium: 10 mg/dL (ref 8.7–10.2)
Chloride: 93 mmol/L — ABNORMAL LOW (ref 96–106)
GFR calc Af Amer: 120 mL/min/{1.73_m2} (ref >59)
GFR calc non Af Amer: 104 mL/min/{1.73_m2} (ref >59)
GLOBULIN, TOTAL: 2.6 g/dL (ref 1.5–4.5)
Glucose: 184 mg/dL — ABNORMAL HIGH (ref 65–99)
POTASSIUM: 3.8 mmol/L (ref 3.5–5.2)
SODIUM: 135 mmol/L (ref 134–144)
Total Protein: 7.3 g/dL (ref 6.0–8.5)

## 2018-01-02 LAB — HEPATITIS PANEL, ACUTE
HEP A IGM: NEGATIVE
HEP B S AG: NEGATIVE
Hep B C IgM: NEGATIVE
Hep C Virus Ab: <0.1 s/co ratio (ref 0.0–0.9)

## 2018-01-02 LAB — LIPASE: Lipase: 27 U/L (ref 14–72)

## 2018-01-08 ENCOUNTER — Other Ambulatory Visit (HOSPITAL_COMMUNITY): Payer: Self-pay | Admitting: Gastroenterology

## 2018-01-08 ENCOUNTER — Other Ambulatory Visit: Payer: Self-pay | Admitting: Gastroenterology

## 2018-01-08 DIAGNOSIS — K219 Gastro-esophageal reflux disease without esophagitis: Secondary | ICD-10-CM | POA: Diagnosis not present

## 2018-01-08 DIAGNOSIS — R1084 Generalized abdominal pain: Secondary | ICD-10-CM | POA: Diagnosis not present

## 2018-01-08 DIAGNOSIS — R945 Abnormal results of liver function studies: Secondary | ICD-10-CM | POA: Diagnosis not present

## 2018-01-11 ENCOUNTER — Ambulatory Visit (HOSPITAL_COMMUNITY)
Admission: RE | Admit: 2018-01-11 | Discharge: 2018-01-11 | Disposition: A | Discharge status: Home / Self Care | Payer: BLUE CROSS/BLUE SHIELD | Source: Ambulatory Visit | Attending: Gastroenterology | Admitting: Gastroenterology

## 2018-01-11 DIAGNOSIS — K760 Fatty (change of) liver, not elsewhere classified: Secondary | ICD-10-CM | POA: Diagnosis not present

## 2018-01-11 DIAGNOSIS — R1084 Generalized abdominal pain: Secondary | ICD-10-CM

## 2018-01-15 ENCOUNTER — Encounter: Payer: Self-pay | Admitting: *Deleted

## 2018-01-18 ENCOUNTER — Ambulatory Visit: Payer: BLUE CROSS/BLUE SHIELD | Admitting: Family Medicine

## 2018-01-18 ENCOUNTER — Other Ambulatory Visit: Payer: Self-pay

## 2018-01-18 ENCOUNTER — Encounter: Payer: Self-pay | Admitting: Family Medicine

## 2018-01-18 VITALS — BP 110/72 | HR 77 | Temp 97.6°F | Ht 66.0 in | Wt 168.0 lb

## 2018-01-18 DIAGNOSIS — R05 Cough: Secondary | ICD-10-CM | POA: Diagnosis not present

## 2018-01-18 DIAGNOSIS — E785 Hyperlipidemia, unspecified: Secondary | ICD-10-CM | POA: Diagnosis not present

## 2018-01-18 DIAGNOSIS — J45909 Unspecified asthma, uncomplicated: Secondary | ICD-10-CM | POA: Diagnosis not present

## 2018-01-18 DIAGNOSIS — R1013 Epigastric pain: Secondary | ICD-10-CM | POA: Diagnosis not present

## 2018-01-18 DIAGNOSIS — E1165 Type 2 diabetes mellitus with hyperglycemia: Secondary | ICD-10-CM

## 2018-01-18 DIAGNOSIS — I10 Essential (primary) hypertension: Secondary | ICD-10-CM | POA: Diagnosis not present

## 2018-01-18 DIAGNOSIS — R059 Cough, unspecified: Secondary | ICD-10-CM

## 2018-01-18 DIAGNOSIS — B3731 Acute candidiasis of vulva and vagina: Secondary | ICD-10-CM

## 2018-01-18 DIAGNOSIS — B373 Candidiasis of vulva and vagina: Secondary | ICD-10-CM

## 2018-01-18 LAB — GLUCOSE, POCT (MANUAL RESULT ENTRY): POC GLUCOSE: 135 mg/dL — AB (ref 70–99)

## 2018-01-18 MED ORDER — LISINOPRIL-HYDROCHLOROTHIAZIDE 20-25 MG PO TABS: 1.0000 | ORAL_TABLET | Freq: Every day | ORAL | 1 refills | Status: DC

## 2018-01-18 MED ORDER — FLUCONAZOLE 150 MG PO TABS: 150.0000 mg | ORAL_TABLET | Freq: Once | ORAL | 0 refills | Status: AC

## 2018-01-18 MED ORDER — ATORVASTATIN CALCIUM 10 MG PO TABS: ORAL_TABLET | ORAL | 1 refills | Status: DC

## 2018-01-18 MED ORDER — ALBUTEROL SULFATE HFA 108 (90 BASE) MCG/ACT IN AERS: 2.0000 | INHALATION_SPRAY | Freq: Four times a day (QID) | RESPIRATORY_TRACT | 0 refills | Status: DC | PRN

## 2018-01-18 MED ORDER — FLUTICASONE PROPIONATE HFA 44 MCG/ACT IN AERO: 2.0000 | INHALATION_SPRAY | Freq: Two times a day (BID) | RESPIRATORY_TRACT | 6 refills | Status: DC

## 2018-01-18 NOTE — Patient Instructions (Addendum)
Continue heartburn med and follow up with gastroenterology as planned.   If yeast infection symptoms are not improving in next 3-4 days with over the counter treatment - can take 1 diflucan.   Ok to continue [place] for now, recheck in 6 weeks. If blood sugars remain in 200's, or any return of yeast infection symptoms - return sooner to discuss med changes.   Increase flovent to 2 puffs twice per day, but if you are still requiring albuterol daily in next 2 weeks we need to change meds. Please return if this is the case.   Cholesterol and blood pressure med refilled, but keep follow up with gastroenterologist regarding liver tests. We may need to put the statin on hold if those levels remain elevated.   Return to the clinic or go to the nearest emergency room if any of your symptoms worsen or new symptoms occur.  I recommend pneumonia vaccine and tetanus vaccines. Ask your neurologist if those are ok to take with your history of seizure wit flu shot.    Diabetes Mellitus and Nutrition When you have diabetes (diabetes mellitus), it is very important to have healthy eating habits because your blood sugar (glucose) levels are greatly affected by what you eat and drink. Eating healthy foods in the appropriate amounts, at about the same times every day, can help you:  Control your blood glucose.  Lower your risk of heart disease.  Improve your blood pressure.  Reach or maintain a healthy weight.  Every person with diabetes is different, and each person has different needs for a meal plan. Your health care provider may recommend that you work with a diet and nutrition specialist (dietitian) to make a meal plan that is best for you. Your meal plan may vary depending on factors such as:  The calories you need.  The medicines you take.  Your weight.  Your blood glucose, blood pressure, and cholesterol levels.  Your activity level.  Other health conditions you have, such as heart or  kidney disease.  How do carbohydrates affect me? Carbohydrates affect your blood glucose level more than any other type of food. Eating carbohydrates naturally increases the amount of glucose in your blood. Carbohydrate counting is a method for keeping track of how many carbohydrates you eat. Counting carbohydrates is important to keep your blood glucose at a healthy level, especially if you use insulin or take certain oral diabetes medicines. It is important to know how many carbohydrates you can safely have in each meal. This is different for every person. Your dietitian can help you calculate how many carbohydrates you should have at each meal and for snack. Foods that contain carbohydrates include:  Bread, cereal, rice, pasta, and crackers.  Potatoes and corn.  Peas, beans, and lentils.  Milk and yogurt.  Fruit and juice.  Desserts, such as cakes, cookies, ice cream, and candy.  How does alcohol affect me? Alcohol can cause a sudden decrease in blood glucose (hypoglycemia), especially if you use insulin or take certain oral diabetes medicines. Hypoglycemia can be a life-threatening condition. Symptoms of hypoglycemia (sleepiness, dizziness, and confusion) are similar to symptoms of having too much alcohol. If your health care provider says that alcohol is safe for you, follow these guidelines:  Limit alcohol intake to no more than 1 drink per day for nonpregnant women and 2 drinks per day for men. One drink equals 12 oz of beer, 5 oz of wine, or 1 oz of hard liquor.  Do not  drink on an empty stomach.  Keep yourself hydrated with water, diet soda, or unsweetened iced tea.  Keep in mind that regular soda, juice, and other mixers may contain a lot of sugar and must be counted as carbohydrates.  What are tips for following this plan? Reading food labels  Start by checking the serving size on the label. The amount of calories, carbohydrates, fats, and other nutrients listed on the  label are based on one serving of the food. Many foods contain more than one serving per package.  Check the total grams (g) of carbohydrates in one serving. You can calculate the number of servings of carbohydrates in one serving by dividing the total carbohydrates by 15. For example, if a food has 30 g of total carbohydrates, it would be equal to 2 servings of carbohydrates.  Check the number of grams (g) of saturated and trans fats in one serving. Choose foods that have low or no amount of these fats.  Check the number of milligrams (mg) of sodium in one serving. Most people should limit total sodium intake to less than 2,300 mg per day.  Always check the nutrition information of foods labeled as "low-fat" or "nonfat". These foods may be higher in added sugar or refined carbohydrates and should be avoided.  Talk to your dietitian to identify your daily goals for nutrients listed on the label. Shopping  Avoid buying canned, premade, or processed foods. These foods tend to be high in fat, sodium, and added sugar.  Shop around the outside edge of the grocery store. This includes fresh fruits and vegetables, bulk grains, fresh meats, and fresh dairy. Cooking  Use low-heat cooking methods, such as baking, instead of high-heat cooking methods like deep frying.  Cook using healthy oils, such as olive, canola, or sunflower oil.  Avoid cooking with butter, cream, or high-fat meats. Meal planning  Eat meals and snacks regularly, preferably at the same times every day. Avoid going long periods of time without eating.  Eat foods high in fiber, such as fresh fruits, vegetables, beans, and whole grains. Talk to your dietitian about how many servings of carbohydrates you can eat at each meal.  Eat 4-6 ounces of lean protein each day, such as lean meat, chicken, fish, eggs, or tofu. 1 ounce is equal to 1 ounce of meat, chicken, or fish, 1 egg, or 1/4 cup of tofu.  Eat some foods each day that  contain healthy fats, such as avocado, nuts, seeds, and fish. Lifestyle   Check your blood glucose regularly.  Exercise at least 30 minutes 5 or more days each week, or as told by your health care provider.  Take medicines as told by your health care provider.  Do not use any products that contain nicotine or tobacco, such as cigarettes and e-cigarettes. If you need help quitting, ask your health care provider.  Work with a Social worker or diabetes educator to identify strategies to manage stress and any emotional and social challenges. What are some questions to ask my health care provider?  Do I need to meet with a diabetes educator?  Do I need to meet with a dietitian?  What number can I call if I have questions?  When are the best times to check my blood glucose? Where to find more information:  American Diabetes Association: diabetes.org/food-and-fitness/food  Academy of Nutrition and Dietetics: PokerClues.dk  [employer] of Diabetes and Digestive and Kidney Diseases (NIH): ContactWire.be Summary  A healthy meal plan will help  you control your blood glucose and maintain a healthy lifestyle.  Working with a diet and nutrition specialist (dietitian) can help you make a meal plan that is best for you.  Keep in mind that carbohydrates and alcohol have immediate effects on your blood glucose levels. It is important to count carbohydrates and to use alcohol carefully. This information is not intended to replace advice given to you by your health care provider. Make sure you discuss any questions you have with your health care provider. Document Released: 05/26/2005 Document Revised: 10/03/2016 Document Reviewed: 10/03/2016 Elsevier Interactive Patient Education  2018 [employer].    Type 2 Diabetes Mellitus, Self Care, Adult When you have type 2  diabetes (type 2 diabetes mellitus), you must keep your blood sugar (glucose) under control. You can do this with:  Nutrition.  Exercise.  Lifestyle changes.  Medicines or insulin, if needed.  Support from your doctors and others.  How do I manage my blood sugar?  Check your blood sugar level every day, as often as told.  Call your doctor if your blood sugar is above your goal numbers for 2 tests in a row.  Have your A1c (hemoglobin A1c) level checked at least two times a year. Have it checked more often if your doctor tells you to. Your doctor will set treatment goals for you. Generally, you should have these blood sugar levels:  Before meals (preprandial): 80-130 mg/dL (4.4-7.2 mmol/L).  After meals (postprandial): lower than 180 mg/dL (10 mmol/L).  A1c level: less than 7%.  What do I need to know about high blood sugar? High blood sugar is called hyperglycemia. Know the signs of high blood sugar. Signs may include:  Feeling: ? Thirsty. ? Hungry. ? Very tired.  Needing to pee (urinate) more than usual.  Blurry vision.  What do I need to know about low blood sugar? Low blood sugar is called hypoglycemia. This is when blood sugar is at or below 70 mg/dL (3.9 mmol/L). Symptoms may include:  Feeling: ? Hungry. ? Worried or nervous (anxious). ? Sweaty and clammy. ? Confused. ? Dizzy. ? Sleepy. ? Sick to your stomach (nauseous).  Having: ? A fast heartbeat (palpitations). ? A headache. ? A change in your vision. ? Jerky movements that you cannot control (seizure). ? Nightmares. ? Tingling or no feeling (numbness) around the mouth, lips, or tongue.  Having trouble with: ? Talking. ? Paying attention (concentrating). ? Moving (coordination). ? Sleeping.  Shaking.  Passing out (fainting).  Getting upset easily (irritability).  Treating low blood sugar  To treat low blood sugar, eat or drink something sugary right away. If you can think clearly and  swallow safely, follow the 15:15 rule:  Take 15 grams of a fast-acting carb (carbohydrate). Some fast-acting carbs are: ? 1 tube of glucose gel. ? 3 sugar tablets (glucose pills). ? 6-8 pieces of hard candy. ? 4 oz (120 mL) of fruit juice. ? 4 oz (120 mL) regular (not diet) soda.  Check your blood sugar 15 minutes after you take the carb.  If your blood sugar is still at or below 70 mg/dL (3.9 mmol/L), take 15 grams of a carb again.  If your blood sugar does not go above 70 mg/dL (3.9 mmol/L) after 3 tries, get help right away.  After your blood sugar goes back to normal, eat a meal or a snack within 1 hour.  Treating very low blood sugar If your blood sugar is at or below 54 mg/dL (3 mmol/L),  you have very low blood sugar (severe hypoglycemia). This is an emergency. Do not wait to see if the symptoms will go away. Get medical help right away. Call your local emergency services (911 in the [place] Do not drive yourself to the hospital. If you have very low blood sugar and you cannot eat or drink, you may need a glucagon shot (injection). A family member or friend should learn how to check your blood sugar and how to give you a glucagon shot. Ask your doctor if you need to have a glucagon shot kit at home. What else is important to manage my diabetes? Medicine Follow these instructions about insulin and diabetes medicines:  Take them as told by your doctor.  Adjust them as told by your doctor.  Do not run out of them.  Having diabetes can raise your risk for other long-term conditions. These include heart or kidney disease. Your doctor may prescribe medicines to help prevent problems from diabetes. Food   Make healthy food choices. These include: ? Chicken, fish, egg whites, and beans. ? Oats, whole wheat, bulgur, brown rice, quinoa, and millet. ? Fresh fruits and vegetables. ? Low-fat dairy products. ? Nuts, avocado, olive oil, and canola oil.  Make a food plan with a  specialist (dietitian).  Follow instructions from your doctor about what you cannot eat or drink.  Drink enough fluid to keep your pee (urine) clear or pale yellow.  Eat healthy snacks between healthy meals.  Keep track of carbs that you eat. Read food labels. Learn food serving sizes.  Follow your sick day plan when you cannot eat or drink normally. Make this plan with your doctor so it is ready to use. Activity  Exercise at least 3 times a week.  Do not go more than 2 days without exercising.  Talk with your doctor before you start a new exercise. Your doctor may need to adjust your insulin, medicines, or food. Lifestyle   Do not use any tobacco products. These include cigarettes, chewing tobacco, and e-cigarettes.If you need help quitting, ask your doctor.  Ask your doctor how much alcohol is safe for you.  Learn to deal with stress. If you need help with this, ask your doctor. Body care  Stay up to date with your shots (immunizations).  Have your eyes and feet checked by a doctor as often as told.  Check your skin and feet every day. Check for cuts, bruises, redness, blisters, or sores.  Brush your teeth and gums two times a day.  Floss at least one time a day.  Go to the dentist least one time every 6 months.  Stay at a healthy weight. General instructions   Take over-the-counter and prescription medicines only as told by your doctor.  Share your diabetes care plan with: ? Your work or school. ? People you live with.  Check your pee (urine) for ketones: ? When you are sick. ? As told by your doctor.  Carry a card or wear jewelry that says that you have diabetes.  Ask your doctor: ? Do I need to meet with a diabetes educator? ? Where can I find a support group for people with diabetes?  Keep all follow-up visits as told by your doctor. This is important. Where to find more information: To learn more about diabetes, visit:  American Diabetes  Association: www.diabetes.org  American Association of Diabetes Educators: www.diabeteseducator.org/patient-resources  This information is not intended to replace advice given to you by your health  care provider. Make sure you discuss any questions you have with your health care provider. Document Released: 12/21/2015 Document Revised: 02/04/2016 Document Reviewed: 10/02/2015 Elsevier Interactive Patient Education  2018 [employer].   IF you received an x-ray today, you will receive an invoice from [facility]. Please contact [facility] at [phone] with questions or concerns regarding your invoice.   IF you received labwork today, you will receive an invoice from [facility]. Please contact [facility] at [phone] with questions or concerns regarding your invoice.   Our billing staff will not be able to assist you with questions regarding bills from these companies.  You will be contacted with the lab results as soon as they are available. The fastest way to get your results is to activate your My Chart account. Instructions are located on the last page of this paperwork. If you have not heard from [place] regarding the results in 2 weeks, please contact this office.

## 2018-01-18 NOTE — Progress Notes (Addendum)
Subjective:  By signing my name below, I, [name], attest that this documentation has been prepared under the direction and in the presence of [name], MD Electronically Signed: Ladene Artist, ED Scribe 01/18/2018 at 10:52 AM.   Patient ID: [name], female    DOB: 1966/06/02, 53 y.o.   MRN: [id number]  Chief Complaint  Patient presents with  . Diabetes    2 week f/u (may have yest infection due to meds)   HPI [name] is a 52 y.o. female who presents to Primary Care at [facility] for f/u on DM. See last visit 4/22. Multiple concerns discussed.  Abdominal Pain See last visit. Recommended GI f/u, continue PPI and cutting back on alcohol. She had a neg acute hepatitis panel, lipase. LFTS overall stable of AST 71, ALT 91 in March. CT abdomen/pelvis on 5/2 with diffuse fatty infiltrate of liver, no pancreatic abnormality or acute findings. Hgb was stable last visit at 13.6. Lab Results  Component Value Date   ALT 67 (H) 01/01/2018   AST 62 (H) 01/01/2018   ALKPHOS 204 (H) 01/01/2018   BILITOT 0.4 01/01/2018  She did see GI who advised that she double PPI which she states has improved abdominal pain. F/u appointment on 5/14 regarding fatty liver.  DM New diagnosis last visit with prior Pre-DM. Started Jardiance 10 mg qd. Decided to hold Metformin given abdominal issues as above. Lab Results  Component Value Date   HGBA1C 8.2 01/01/2018  Checks her blood glucose daily with lowest reading of 160, highest of 260 while "splurging" last wk in [place]. Typically blood glucose ranges 180s-190s, she notes a reading of 183 this morning.  Pt suspects she has developed a yeast infection since starting Jardiance. She has noticed vaginal itching onset 4 days ago. Denies vaginal discharge. She has tried increasing her water consumption, Monistat and OTC cream with minimal relief.  Unable to provide urine for microalbumin today, plans to do so next visit.  Health maintenance  plans to discuss colonoscopy with gastroenterologist,: Status post hysterectomy.  Reportedly had seizure with flu vaccine, asked that she discuss with her neurologist whether or not pneumonia vaccine and Tdap can be given.  Reactive Airway Using albuterol 2-3 times/day and Flovent 1 puff bid. States she has more wheezing and has been using albuterol more often due to pollen. She has been taking her allergy meds daily as well.  HTN BP Readings from Last 3 Encounters:  01/18/18 110/72  01/01/18 130/88  11/14/17 123/84   Lab Results  Component Value Date   CREATININE 0.62 01/01/2018   Hyperlipidemia Lab Results  Component Value Date   CHOL 192 05/11/2017   HDL 45 05/11/2017   LDLCALC 80 05/11/2017   TRIG 337 (H) 05/11/2017   CHOLHDL 4.3 05/11/2017   Lab Results  Component Value Date   ALT 67 (H) 01/01/2018   AST 62 (H) 01/01/2018   ALKPHOS 204 (H) 01/01/2018   BILITOT 0.4 01/01/2018  Pt is fasting at this visit.  Pt states that she has not seen an eye doctor in several yrs since she no longer has vision insurance. Reports occasional, unchanged HAs that she attributes to straining to see. States her vision has gradually worsened.  Patient Active Problem List   Diagnosis Date Noted  . Yeast vaginitis 09/09/2017  . Community acquired pneumonia of left lower lobe of lung ([place]) 09/01/2017  . Moderate persistent asthma with acute exacerbation 09/01/2017  . Paresthesias 08/01/2017  . LGSIL  Pap smear of vagina 11/19/2013  . 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 Asthma   . Hyperlipemia   . Hypertension   . Seizures ([place])    Past Surgical History:  Procedure Laterality Date  . ABDOMINAL HYSTERECTOMY    . CHOLECYSTECTOMY     Allergies  Allergen Reactions  . Contrast Media [Iodinated Diagnostic Agents] Anaphylaxis    Contrast Dye   . Codeine   . Iohexol      Desc: UNABLE TO BREATH   . Other Other (See Comments)     Cashews and pistachios; reaction unknown   Prior to Admission medications   Medication Sig Start Date End Date Taking? Authorizing Provider  albuterol (PROVENTIL HFA;VENTOLIN HFA) 108 (90 Base) MCG/ACT inhaler Inhale 2 puffs into the lungs every 6 (six) hours as needed for wheezing or shortness of breath. 11/14/17  Yes [name], MD  atorvastatin (LIPITOR) 10 MG tablet TAKE 1 TABLET BY MOUTH ONCE DAILY AT  6  PM 11/14/17  Yes [name], MD  blood glucose meter kit and supplies Dispense based on patient and insurance preference. Use once per day. 01/01/18  Yes [name], MD  chlorpheniramine-HYDROcodone (TUSSIONEX) 10-8 MG/5ML SUER Take 5 mLs by mouth every 12 (twelve) hours as needed for cough (note: will cause drowsiness.). 09/01/17  Yes Emeterio Reeve, DO  empagliflozin (JARDIANCE) 10 MG TABS tablet Take 10 mg by mouth daily. 01/01/18  Yes [name], MD  esomeprazole (NEXIUM) 20 MG packet Take 20 mg by mouth daily before breakfast.   Yes [provider]  Fish Oil-Cholecalciferol (FISH OIL + D3) 1000-1000 MG-UNIT CAPS Take by mouth.   Yes [provider]  fluticasone (FLOVENT HFA) 44 MCG/ACT inhaler Inhale 1-2 puffs into the lungs 2 (two) times daily. 11/14/17  Yes [name], MD  levETIRAcetam (KEPPRA) 500 MG tablet TAKE ONE TABLET BY MOUTH IN THE MORNING AND TWO TABLETS AT BEDTIME 08/01/17  Yes Dennie Bible, NP  lisinopril-hydrochlorothiazide (PRINZIDE,ZESTORETIC) 20-25 MG tablet Take 1 tablet by mouth daily. 11/14/17  Yes [name], MD  Multiple Vitamin (MULTIVITAMIN) tablet Take 1 tablet by mouth daily.   Yes [provider]  sertraline (ZOLOFT) 50 MG tablet Take 1 tablet (50 mg total) by mouth daily. 11/14/17  Yes [name], MD   Social History   Socioeconomic History  . Marital status: Widowed    Spouse name: Not on file  . Number of children: 2  . Years of education: 39  . Highest education level: Not  on file  Occupational History  . Not on file  Social Needs  . Financial resource strain: Not on file  . Food insecurity:    Worry: Not on file    Inability: Not on file  . Transportation needs:    Medical: Not on file    Non-medical: Not on file  Tobacco Use  . Smoking status: Former Research scientist (life sciences)  . Smokeless tobacco: Never Used  Substance and Sexual Activity  . Alcohol use: Yes    Alcohol/week: 1.2 oz    Types: 2 Cans of beer per week    Comment: beer, liquor  . Drug use: No  . Sexual activity: Yes    Birth control/protection: Surgical  Lifestyle  . Physical activity:    Days per week: Not on file    Minutes per session: Not on file  . Stress: Not on file  Relationships  .  Social connections:    Talks on phone: Not on file    Gets together: Not on file    Attends religious service: Not on file    Active member of club or organization: Not on file    Attends meetings of clubs or organizations: Not on file    Relationship status: Not on file  . Intimate partner violence:    Fear of current or ex partner: Not on file    Emotionally abused: Not on file    Physically abused: Not on file    Forced sexual activity: Not on file  Other Topics Concern  . Not on file  Social History Narrative   Patient is widowed.   Patient has 2 children.   Patient is currently unemployed.   Patient has a high school education.   Patient is right-handed.   Patient drinks 2 glass of caffeine daily ( soda and tea).   Review of Systems  Constitutional: Negative for fatigue and unexpected weight change.  Respiratory: Positive for wheezing (intermittent). Negative for chest tightness and shortness of breath.   Cardiovascular: Negative for chest pain, palpitations and leg swelling.  Gastrointestinal: Negative for abdominal pain (resolved) and blood in stool.  Genitourinary: Negative for vaginal discharge.       + Vaginal itching  Neurological: Positive for headaches (occasional, unchanged).  Negative for dizziness, syncope and light-headedness.      Objective:   Physical Exam  Constitutional: She is oriented to person, place, and time. She appears well-developed and well-nourished.  HENT:  Head: Normocephalic and atraumatic.  Eyes: Pupils are equal, round, and reactive to light. Conjunctivae and EOM are normal.  Neck: Carotid bruit is not present.  Cardiovascular: Normal rate, regular rhythm, normal heart sounds and intact distal pulses.  Pulmonary/Chest: Effort normal and breath sounds normal.  Abdominal: Soft. She exhibits no pulsatile midline mass. There is no tenderness.  Neurological: She is alert and oriented to person, place, and time.  Skin: Skin is warm and dry.  Psychiatric: She has a normal mood and affect. Her behavior is normal.  Vitals reviewed.  Vitals:   01/18/18 1023  BP: 110/72  Pulse: 77  Temp: 97.6 F (36.4 C)  TempSrc: Oral  SpO2: 96%  Weight: 168 lb (76.2 kg)  Height: _0  (1.676 m)      Assessment & Plan:    [name] is a 52 y.o. female Abdominal pain, epigastric  - recent CT reviewed and followed by GI. Continued plan to decrease alcohol intake, continue PPI, and planned follow up with GI.   Candidal vaginitis - Plan: fluconazole (DIFLUCAN) 150 MG tablet  - may be related to SGLT2. On OTC treatment. Diflucan Rx if not improving. Consider change from SGLT2 if recurrent.   Type 2 diabetes mellitus with hyperglycemia, without long-term current use of insulin (HCC) - Plan: POCT glucose (manual entry)  - recommend optho eval. Monitor home readings. If remains in 200s may need additional med besides Jardiance.   Cough - Plan: albuterol (PROVENTIL HFA;VENTOLIN HFA) 108 (90 Base) MCG/ACT inhaler, fluticasone (FLOVENT HFA) 44 MCG/ACT inhaler Asthma, unspecified asthma severity, unspecified whether complicated, unspecified whether persistent - Plan: albuterol (PROVENTIL HFA;VENTOLIN HFA) 108 (90 Base) MCG/ACT inhaler  - increase flovent  to 2 puffs BID. If frequent albuterol needed - may need to add LABA.   Hyperlipidemia, unspecified hyperlipidemia type - Plan: atorvastatin (LIPITOR) 10 MG tablet, Lipid panel  - continue lipitor for now, but consider trial off if LFt''s remain  elevated.   Essential hypertension - Plan: lisinopril-hydrochlorothiazide (PRINZIDE,ZESTORETIC) 20-25 MG tablet  - stable. Continue same dose lisinopril HCT.   Meds ordered this encounter  Medications  . fluconazole (DIFLUCAN) 150 MG tablet    Sig: Take 1 tablet (150 mg total) by mouth once for 1 dose.    Dispense:  1 tablet    Refill:  0  . albuterol (PROVENTIL HFA;VENTOLIN HFA) 108 (90 Base) MCG/ACT inhaler    Sig: Inhale 2 puffs into the lungs every 6 (six) hours as needed for wheezing or shortness of breath.    Dispense:  1 Inhaler    Refill:  0  . fluticasone (FLOVENT HFA) 44 MCG/ACT inhaler    Sig: Inhale 2 puffs into the lungs 2 (two) times daily.    Dispense:  1 Inhaler    Refill:  6  . atorvastatin (LIPITOR) 10 MG tablet    Sig: TAKE 1 TABLET BY MOUTH ONCE DAILY AT  6  PM    Dispense:  90 tablet    Refill:  1  . lisinopril-hydrochlorothiazide (PRINZIDE,ZESTORETIC) 20-25 MG tablet    Sig: Take 1 tablet by mouth daily.    Dispense:  90 tablet    Refill:  1   Patient Instructions   Continue heartburn med and follow up with gastroenterology as planned.   If yeast infection symptoms are not improving in next 3-4 days with over the counter treatment - can take 1 diflucan.   Ok to continue [place] for now, recheck in 6 weeks. If blood sugars remain in 200's, or any return of yeast infection symptoms - return sooner to discuss med changes.   Increase flovent to 2 puffs twice per day, but if you are still requiring albuterol daily in next 2 weeks we need to change meds. Please return if this is the case.   Cholesterol and blood pressure med refilled, but keep follow up with gastroenterologist regarding liver tests. We may need to put  the statin on hold if those levels remain elevated.   Return to the clinic or go to the nearest emergency room if any of your symptoms worsen or new symptoms occur.  I recommend pneumonia vaccine and tetanus vaccines. Ask your neurologist if those are ok to take with your history of seizure wit flu shot.    Diabetes Mellitus and Nutrition When you have diabetes (diabetes mellitus), it is very important to have healthy eating habits because your blood sugar (glucose) levels are greatly affected by what you eat and drink. Eating healthy foods in the appropriate amounts, at about the same times every day, can help you:  Control your blood glucose.  Lower your risk of heart disease.  Improve your blood pressure.  Reach or maintain a healthy weight.  Every person with diabetes is different, and each person has different needs for a meal plan. Your health care provider may recommend that you work with a diet and nutrition specialist (dietitian) to make a meal plan that is best for you. Your meal plan may vary depending on factors such as:  The calories you need.  The medicines you take.  Your weight.  Your blood glucose, blood pressure, and cholesterol levels.  Your activity level.  Other health conditions you have, such as heart or kidney disease.  How do carbohydrates affect me? Carbohydrates affect your blood glucose level more than any other type of food. Eating carbohydrates naturally increases the amount of glucose in your blood. Carbohydrate counting is a method  for keeping track of how many carbohydrates you eat. Counting carbohydrates is important to keep your blood glucose at a healthy level, especially if you use insulin or take certain oral diabetes medicines. It is important to know how many carbohydrates you can safely have in each meal. This is different for every person. Your dietitian can help you calculate how many carbohydrates you should have at each meal and for  snack. Foods that contain carbohydrates include:  Bread, cereal, rice, pasta, and crackers.  Potatoes and corn.  Peas, beans, and lentils.  Milk and yogurt.  Fruit and juice.  Desserts, such as cakes, cookies, ice cream, and candy.  How does alcohol affect me? Alcohol can cause a sudden decrease in blood glucose (hypoglycemia), especially if you use insulin or take certain oral diabetes medicines. Hypoglycemia can be a life-threatening condition. Symptoms of hypoglycemia (sleepiness, dizziness, and confusion) are similar to symptoms of having too much alcohol. If your health care provider says that alcohol is safe for you, follow these guidelines:  Limit alcohol intake to no more than 1 drink per day for nonpregnant women and 2 drinks per day for men. One drink equals 12 oz of beer, 5 oz of wine, or 1 oz of hard liquor.  Do not drink on an empty stomach.  Keep yourself hydrated with water, diet soda, or unsweetened iced tea.  Keep in mind that regular soda, juice, and other mixers may contain a lot of sugar and must be counted as carbohydrates.  What are tips for following this plan? Reading food labels  Start by checking the serving size on the label. The amount of calories, carbohydrates, fats, and other nutrients listed on the label are based on one serving of the food. Many foods contain more than one serving per package.  Check the total grams (g) of carbohydrates in one serving. You can calculate the number of servings of carbohydrates in one serving by dividing the total carbohydrates by 15. For example, if a food has 30 g of total carbohydrates, it would be equal to 2 servings of carbohydrates.  Check the number of grams (g) of saturated and trans fats in one serving. Choose foods that have low or no amount of these fats.  Check the number of milligrams (mg) of sodium in one serving. Most people should limit total sodium intake to less than 2,300 mg per day.  Always  check the nutrition information of foods labeled as "low-fat" or "nonfat". These foods may be higher in added sugar or refined carbohydrates and should be avoided.  Talk to your dietitian to identify your daily goals for nutrients listed on the label. Shopping  Avoid buying canned, premade, or processed foods. These foods tend to be high in fat, sodium, and added sugar.  Shop around the outside edge of the grocery store. This includes fresh fruits and vegetables, bulk grains, fresh meats, and fresh dairy. Cooking  Use low-heat cooking methods, such as baking, instead of high-heat cooking methods like deep frying.  Cook using healthy oils, such as olive, canola, or sunflower oil.  Avoid cooking with butter, cream, or high-fat meats. Meal planning  Eat meals and snacks regularly, preferably at the same times every day. Avoid going long periods of time without eating.  Eat foods high in fiber, such as fresh fruits, vegetables, beans, and whole grains. Talk to your dietitian about how many servings of carbohydrates you can eat at each meal.  Eat 4-6 ounces of lean protein each  day, such as lean meat, chicken, fish, eggs, or tofu. 1 ounce is equal to 1 ounce of meat, chicken, or fish, 1 egg, or 1/4 cup of tofu.  Eat some foods each day that contain healthy fats, such as avocado, nuts, seeds, and fish. Lifestyle   Check your blood glucose regularly.  Exercise at least 30 minutes 5 or more days each week, or as told by your health care provider.  Take medicines as told by your health care provider.  Do not use any products that contain nicotine or tobacco, such as cigarettes and e-cigarettes. If you need help quitting, ask your health care provider.  Work with a Social worker or diabetes educator to identify strategies to manage stress and any emotional and social challenges. What are some questions to ask my health care provider?  Do I need to meet with a diabetes educator?  Do I need  to meet with a dietitian?  What number can I call if I have questions?  When are the best times to check my blood glucose? Where to find more information:  American Diabetes Association: diabetes.org/food-and-fitness/food  Academy of Nutrition and Dietetics: PokerClues.dk  [employer] of Diabetes and Digestive and Kidney Diseases (NIH): ContactWire.be Summary  A healthy meal plan will help you control your blood glucose and maintain a healthy lifestyle.  Working with a diet and nutrition specialist (dietitian) can help you make a meal plan that is best for you.  Keep in mind that carbohydrates and alcohol have immediate effects on your blood glucose levels. It is important to count carbohydrates and to use alcohol carefully. This information is not intended to replace advice given to you by your health care provider. Make sure you discuss any questions you have with your health care provider. Document Released: 05/26/2005 Document Revised: 10/03/2016 Document Reviewed: 10/03/2016 Elsevier Interactive Patient Education  2018 [employer].    Type 2 Diabetes Mellitus, Self Care, Adult When you have type 2 diabetes (type 2 diabetes mellitus), you must keep your blood sugar (glucose) under control. You can do this with:  Nutrition.  Exercise.  Lifestyle changes.  Medicines or insulin, if needed.  Support from your doctors and others.  How do I manage my blood sugar?  Check your blood sugar level every day, as often as told.  Call your doctor if your blood sugar is above your goal numbers for 2 tests in a row.  Have your A1c (hemoglobin A1c) level checked at least two times a year. Have it checked more often if your doctor tells you to. Your doctor will set treatment goals for you. Generally, you should have these blood sugar levels:  Before  meals (preprandial): 80-130 mg/dL (4.4-7.2 mmol/L).  After meals (postprandial): lower than 180 mg/dL (10 mmol/L).  A1c level: less than 7%.  What do I need to know about high blood sugar? High blood sugar is called hyperglycemia. Know the signs of high blood sugar. Signs may include:  Feeling: ? Thirsty. ? Hungry. ? Very tired.  Needing to pee (urinate) more than usual.  Blurry vision.  What do I need to know about low blood sugar? Low blood sugar is called hypoglycemia. This is when blood sugar is at or below 70 mg/dL (3.9 mmol/L). Symptoms may include:  Feeling: ? Hungry. ? Worried or nervous (anxious). ? Sweaty and clammy. ? Confused. ? Dizzy. ? Sleepy. ? Sick to your stomach (nauseous).  Having: ? A fast heartbeat (palpitations). ? A headache. ? A change in  your vision. ? Jerky movements that you cannot control (seizure). ? Nightmares. ? Tingling or no feeling (numbness) around the mouth, lips, or tongue.  Having trouble with: ? Talking. ? Paying attention (concentrating). ? Moving (coordination). ? Sleeping.  Shaking.  Passing out (fainting).  Getting upset easily (irritability).  Treating low blood sugar  To treat low blood sugar, eat or drink something sugary right away. If you can think clearly and swallow safely, follow the 15:15 rule:  Take 15 grams of a fast-acting carb (carbohydrate). Some fast-acting carbs are: ? 1 tube of glucose gel. ? 3 sugar tablets (glucose pills). ? 6-8 pieces of hard candy. ? 4 oz (120 mL) of fruit juice. ? 4 oz (120 mL) regular (not diet) soda.  Check your blood sugar 15 minutes after you take the carb.  If your blood sugar is still at or below 70 mg/dL (3.9 mmol/L), take 15 grams of a carb again.  If your blood sugar does not go above 70 mg/dL (3.9 mmol/L) after 3 tries, get help right away.  After your blood sugar goes back to normal, eat a meal or a snack within 1 hour.  Treating very low blood sugar If  your blood sugar is at or below 54 mg/dL (3 mmol/L), you have very low blood sugar (severe hypoglycemia). This is an emergency. Do not wait to see if the symptoms will go away. Get medical help right away. Call your local emergency services (911 in the [place] Do not drive yourself to the hospital. If you have very low blood sugar and you cannot eat or drink, you may need a glucagon shot (injection). A family member or friend should learn how to check your blood sugar and how to give you a glucagon shot. Ask your doctor if you need to have a glucagon shot kit at home. What else is important to manage my diabetes? Medicine Follow these instructions about insulin and diabetes medicines:  Take them as told by your doctor.  Adjust them as told by your doctor.  Do not run out of them.  Having diabetes can raise your risk for other long-term conditions. These include heart or kidney disease. Your doctor may prescribe medicines to help prevent problems from diabetes. Food   Make healthy food choices. These include: ? Chicken, fish, egg whites, and beans. ? Oats, whole wheat, bulgur, brown rice, quinoa, and millet. ? Fresh fruits and vegetables. ? Low-fat dairy products. ? Nuts, avocado, olive oil, and canola oil.  Make a food plan with a specialist (dietitian).  Follow instructions from your doctor about what you cannot eat or drink.  Drink enough fluid to keep your pee (urine) clear or pale yellow.  Eat healthy snacks between healthy meals.  Keep track of carbs that you eat. Read food labels. Learn food serving sizes.  Follow your sick day plan when you cannot eat or drink normally. Make this plan with your doctor so it is ready to use. Activity  Exercise at least 3 times a week.  Do not go more than 2 days without exercising.  Talk with your doctor before you start a new exercise. Your doctor may need to adjust your insulin, medicines, or food. Lifestyle   Do not use any tobacco  products. These include cigarettes, chewing tobacco, and e-cigarettes.If you need help quitting, ask your doctor.  Ask your doctor how much alcohol is safe for you.  Learn to deal with stress. If you need help with this, ask your  doctor. Body care  Stay up to date with your shots (immunizations).  Have your eyes and feet checked by a doctor as often as told.  Check your skin and feet every day. Check for cuts, bruises, redness, blisters, or sores.  Brush your teeth and gums two times a day.  Floss at least one time a day.  Go to the dentist least one time every 6 months.  Stay at a healthy weight. General instructions   Take over-the-counter and prescription medicines only as told by your doctor.  Share your diabetes care plan with: ? Your work or school. ? People you live with.  Check your pee (urine) for ketones: ? When you are sick. ? As told by your doctor.  Carry a card or wear jewelry that says that you have diabetes.  Ask your doctor: ? Do I need to meet with a diabetes educator? ? Where can I find a support group for people with diabetes?  Keep all follow-up visits as told by your doctor. This is important. Where to find more information: To learn more about diabetes, visit:  American Diabetes Association: www.diabetes.org  American Association of Diabetes Educators: www.diabeteseducator.org/patient-resources  This information is not intended to replace advice given to you by your health care provider. Make sure you discuss any questions you have with your health care provider. Document Released: 12/21/2015 Document Revised: 02/04/2016 Document Reviewed: 10/02/2015 Elsevier Interactive Patient Education  2018 [employer].   IF you received an x-ray today, you will receive an invoice from [facility]. Please contact [facility] at [phone] with questions or concerns regarding your invoice.   IF you received labwork today, you will  receive an invoice from [place]. Please contact [facility] at [phone] with questions or concerns regarding your invoice.   Our billing staff will not be able to assist you with questions regarding bills from these companies.  You will be contacted with the lab results as soon as they are available. The fastest way to get your results is to activate your My Chart account. Instructions are located on the last page of this paperwork. If you have not heard from [place] regarding the results in 2 weeks, please contact this office.       I personally performed the services described in this documentation, which was scribed in my presence. The recorded information has been reviewed and considered for accuracy and completeness, addended by me as needed, and agree with information above.  Signed,   Merri Ray, MD [facility].  01/18/18 11:17 AM

## 2018-01-19 LAB — LIPID PANEL
CHOLESTEROL TOTAL: 204 mg/dL — AB (ref 100–199)
Chol/HDL Ratio: 5.8 ratio — ABNORMAL HIGH (ref 0.0–4.4)
HDL: 35 mg/dL — ABNORMAL LOW (ref >39)
LDL CALC: 112 mg/dL — AB (ref 0–99)
TRIGLYCERIDES: 287 mg/dL — AB (ref 0–149)
VLDL CHOLESTEROL CAL: 57 mg/dL — AB (ref 5–40)

## 2018-01-20 ENCOUNTER — Encounter: Payer: Self-pay | Admitting: Family Medicine

## 2018-01-23 DIAGNOSIS — E119 Type 2 diabetes mellitus without complications: Secondary | ICD-10-CM | POA: Diagnosis not present

## 2018-01-23 DIAGNOSIS — R1013 Epigastric pain: Secondary | ICD-10-CM | POA: Diagnosis not present

## 2018-01-23 DIAGNOSIS — K219 Gastro-esophageal reflux disease without esophagitis: Secondary | ICD-10-CM | POA: Diagnosis not present

## 2018-01-23 DIAGNOSIS — R945 Abnormal results of liver function studies: Secondary | ICD-10-CM | POA: Diagnosis not present

## 2018-01-30 ENCOUNTER — Encounter: Payer: Self-pay | Admitting: *Deleted

## 2018-01-30 DIAGNOSIS — E119 Type 2 diabetes mellitus without complications: Secondary | ICD-10-CM | POA: Diagnosis not present

## 2018-01-30 DIAGNOSIS — H40033 Anatomical narrow angle, bilateral: Secondary | ICD-10-CM | POA: Diagnosis not present

## 2018-03-01 ENCOUNTER — Ambulatory Visit: Payer: BLUE CROSS/BLUE SHIELD | Admitting: Family Medicine

## 2018-03-13 DIAGNOSIS — R1013 Epigastric pain: Secondary | ICD-10-CM | POA: Diagnosis not present

## 2018-03-13 DIAGNOSIS — K449 Diaphragmatic hernia without obstruction or gangrene: Secondary | ICD-10-CM | POA: Diagnosis not present

## 2018-03-13 DIAGNOSIS — Z1211 Encounter for screening for malignant neoplasm of colon: Secondary | ICD-10-CM | POA: Diagnosis not present

## 2018-03-13 DIAGNOSIS — K219 Gastro-esophageal reflux disease without esophagitis: Secondary | ICD-10-CM | POA: Diagnosis not present

## 2018-03-13 LAB — HM COLONOSCOPY

## 2018-03-19 ENCOUNTER — Ambulatory Visit: Payer: BLUE CROSS/BLUE SHIELD | Admitting: Family Medicine

## 2018-03-19 ENCOUNTER — Ambulatory Visit (INDEPENDENT_AMBULATORY_CARE_PROVIDER_SITE_OTHER): Payer: BLUE CROSS/BLUE SHIELD

## 2018-03-19 ENCOUNTER — Encounter: Payer: Self-pay | Admitting: Family Medicine

## 2018-03-19 VITALS — BP 110/73 | HR 76 | Temp 98.2°F | Ht 66.5 in | Wt 165.0 lb

## 2018-03-19 DIAGNOSIS — M79605 Pain in left leg: Secondary | ICD-10-CM | POA: Diagnosis not present

## 2018-03-19 DIAGNOSIS — M545 Low back pain: Secondary | ICD-10-CM | POA: Diagnosis not present

## 2018-03-19 DIAGNOSIS — R208 Other disturbances of skin sensation: Secondary | ICD-10-CM | POA: Diagnosis not present

## 2018-03-19 DIAGNOSIS — Z794 Long term (current) use of insulin: Secondary | ICD-10-CM | POA: Diagnosis not present

## 2018-03-19 DIAGNOSIS — E119 Type 2 diabetes mellitus without complications: Secondary | ICD-10-CM | POA: Diagnosis not present

## 2018-03-19 DIAGNOSIS — M79604 Pain in right leg: Secondary | ICD-10-CM

## 2018-03-19 DIAGNOSIS — E1165 Type 2 diabetes mellitus with hyperglycemia: Secondary | ICD-10-CM | POA: Diagnosis not present

## 2018-03-19 NOTE — Progress Notes (Signed)
Subjective:  By signing my name below, I, Moises Blood, attest that this documentation has been prepared under the direction and in the presence of Merri Ray, MD. Electronically Signed: Moises Blood, [place]. 03/19/2018 , 4:06 PM .  Patient was seen in Room 10 .   Patient ID: [name], female    DOB: 10-23-1965, 52 y.o.   MRN: [id number] Chief Complaint  Patient presents with  . both leg pain.    maybe due to lower back  . Diabetes   HPI [name] is a 52 y.o. female Here for 2 concerns today.   Diabetes Lab Results  Component Value Date   HGBA1C 8.2 01/01/2018   She takes Jardiance 10 mg qd. She has received treatment for candidal vaginitis previously. She denies any return of the vaginitis with the Jardiance. She checks her blood sugar at home, ranging from 127 to 180s; this morning was 142.   Optho: recommended last visit; seen 2 months ago. Received new glasses, and informed no diabetic retinopathy.  Pneumovax: due for this; she states needing to discuss with her neurologist first, as she can't receive flu shot (seizure).   Bilateral leg pain and low back pain Briefly discussed in April with numbness and leg pain at that time; thought to be possibly degenerative disc disease. When seen in April, reported intermittent leg pain and tingling in her right foot for over a year, radiating pain from buttock down left leg. She had normal imaging in 2011, and normal TSH and B-12 in March 2019. Her pain had been stable at that point. We held on anti-inflammatory due to other abdominal issues at that time. Differential included sciatica and neuropathy.   Patient reports bilateral feet burning and stinging has gotten worse, left worse than right. She notes tingling has been ongoing for about a year. She denies urinary or bowel incontinence, saddle anesthesia, or weakness. She denies any known history of injury to her back. She also notes having pain in her low back as well.  She denies any new unexplained weight loss, or night sweats. She's taken naproxen in the past, and taking OTC ibuprofen right now. She's had heartburn lately, but only when she misses her heartburn medication. Her tingling hasn't improved with Jardiance. 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 She has a couple alcoholic drinks every other day.   She saw Tenna Delaine, PA-C in Aug 2018 for low back pain. She was seen by her neurologist, Evlyn Courier, in Nov 2018 for burning and tingling in her feet; discussed EMG conduction test but was declined.   Patient Active Problem List   Diagnosis Date Noted  . Yeast vaginitis 09/09/2017  . Community acquired pneumonia of left lower lobe of lung ([place]) 09/01/2017  . Moderate persistent asthma with acute exacerbation 09/01/2017  . Paresthesias 08/01/2017  . 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 Asthma   . Hyperlipemia   . Hypertension   . Seizures ([place])    Past Surgical History:  Procedure Laterality Date  . ABDOMINAL HYSTERECTOMY    . CHOLECYSTECTOMY     Allergies  Allergen Reactions  . Contrast Media [Iodinated Diagnostic Agents] Anaphylaxis    Contrast Dye   . Codeine   . Iohexol      Desc: UNABLE TO BREATH   . Other Other (See Comments)    Cashews  and pistachios; reaction unknown   Prior to Admission medications   Medication Sig Start Date End Date Taking? Authorizing Provider  albuterol (PROVENTIL HFA;VENTOLIN HFA) 108 (90 Base) MCG/ACT inhaler Inhale 2 puffs into the lungs every 6 (six) hours as needed for wheezing or shortness of breath. 01/18/18   [name], MD  atorvastatin (LIPITOR) 10 MG tablet TAKE 1 TABLET BY MOUTH ONCE DAILY AT  6  PM 01/18/18   [name], MD  blood glucose meter kit and supplies Dispense based on patient and insurance preference. Use once per day. 01/01/18   [name], MD  chlorpheniramine-HYDROcodone (TUSSIONEX) 10-8 MG/5ML SUER Take 5 mLs by mouth every 12 (twelve) hours as needed for cough (note: will cause drowsiness.). 09/01/17   Emeterio Reeve, DO  empagliflozin (JARDIANCE) 10 MG TABS tablet Take 10 mg by mouth daily. 01/01/18   [name], MD  esomeprazole (NEXIUM) 20 MG packet Take 20 mg by mouth daily before breakfast.    [provider]  Fish Oil-Cholecalciferol (FISH OIL + D3) 1000-1000 MG-UNIT CAPS Take by mouth.    [provider]  fluticasone (FLOVENT HFA) 44 MCG/ACT inhaler Inhale 2 puffs into the lungs 2 (two) times daily. 01/18/18   [name], MD  levETIRAcetam (KEPPRA) 500 MG tablet TAKE ONE TABLET BY MOUTH IN THE MORNING AND TWO TABLETS AT BEDTIME 08/01/17   Dennie Bible, NP  lisinopril-hydrochlorothiazide (PRINZIDE,ZESTORETIC) 20-25 MG tablet Take 1 tablet by mouth daily. 01/18/18   [name], MD  Multiple Vitamin (MULTIVITAMIN) tablet Take 1 tablet by mouth daily.    [provider]  sertraline (ZOLOFT) 50 MG tablet Take 1 tablet (50 mg total) by mouth daily. 11/14/17   [name], MD   Social History   Socioeconomic History  . Marital status: Widowed    Spouse name: Not on file  . Number of children: 2  . Years of education: 51  . Highest education level: Not on file  Occupational History  . Not on file  Social Needs  . Financial resource strain: Not on file  . Food insecurity:    Worry: Not on file    Inability: Not on file  . Transportation needs:    Medical: Not on file    Non-medical: Not on file  Tobacco Use  . Smoking status: Former Research scientist (life sciences)  . Smokeless tobacco: Never Used  Substance and Sexual Activity  . Alcohol use: Yes    Alcohol/week: 1.2 oz    Types: 2 Cans of beer per week    Comment: beer, liquor  . Drug use: No  . Sexual activity: Yes    Birth control/protection: Surgical  Lifestyle  . Physical activity:    Days per week: Not on file     Minutes per session: Not on file  . Stress: Not on file  Relationships  . Social connections:    Talks on phone: Not on file    Gets together: Not on file    Attends religious service: Not on file    Active member of club or organization: Not on file    Attends meetings of clubs or organizations: Not on file    Relationship status: Not on file  . Intimate partner violence:    Fear of current or ex partner: Not on file    Emotionally abused: Not on file    Physically abused: Not on file    Forced sexual activity: Not on file  Other Topics Concern  . Not on file  Social History Narrative   Patient is widowed.   Patient has 2 children.   Patient is currently unemployed.   Patient has a high school education.   Patient is right-handed.   Patient drinks 2 glass of caffeine daily ( soda and tea).   Review of Systems  Constitutional: Negative for fatigue and unexpected weight change.  Respiratory: Negative for chest tightness and shortness of breath.   Cardiovascular: Negative for chest pain, palpitations and leg swelling.  Gastrointestinal: Negative for abdominal pain and blood in stool.  Musculoskeletal: Positive for back pain and myalgias.  Neurological: Positive for numbness. Negative for dizziness, syncope, weakness, light-headedness and headaches.       Objective:   Physical Exam  Constitutional: She is oriented to person, place, and time. She appears well-developed and well-nourished. No distress.  HENT:  Head: Normocephalic and atraumatic.  Eyes: Pupils are equal, round, and reactive to light. EOM are normal.  Neck: Neck supple.  Cardiovascular: Normal rate.  Pulmonary/Chest: Effort normal. No respiratory distress.  Musculoskeletal: Normal range of motion.  Back: no focal tenderness, no bony tenderness, full ROM lumbar spine; able to heel-toe walk without difficulty; negative seated straight leg raise bilaterally Lower extremities: no focal tenderness or bony tenderness  along the thighs bilaterally, pain free internal and external rotation of hips bilaterally  Neurological: She is alert and oriented to person, place, and time.  Reflex Scores:      Patellar reflexes are 2+ on the right side and 2+ on the left side.      Achilles reflexes are 2+ on the right side and 2+ on the left side. Skin: Skin is warm and dry.  Psychiatric: She has a normal mood and affect. Her behavior is normal.  Nursing note and vitals reviewed.   Vitals:   03/19/18 1515  BP: 110/73  Pulse: 76  Temp: 98.2 F (36.8 C)  TempSrc: Oral  SpO2: 96%  Weight: 165 lb (74.8 kg)  Height: 5' 6.5" (1.689 m)   Dg Lumbar Spine Complete  Result Date: 03/19/2018 CLINICAL DATA:  Low back pain. EXAM: LUMBAR SPINE - COMPLETE 4+ VIEW COMPARISON:  Abdomen and pelvis Jan 11, 2018 FINDINGS: For non rib-bearing lumbar-type vertebral bodies are intact and aligned with maintenance of the lumbar lordosis. Moderate L5-S1 disc height loss in part reflecting transitional anatomy. Mild lower lumbar facet arthropathy. No destructive bony lesions. Sacroiliac joints are symmetric. Included prevertebral and paraspinal soft tissue planes are non-suspicious. Surgical clips in the included right abdomen compatible with cholecystectomy. Phleboliths project in the pelvis. Mild vascular calcifications. IMPRESSION: 1. Transitional anatomy.  No fracture deformity or malalignment. 2. L5-S1 disc protrusion/extrusion better characterized on prior CT. 3. Mild lower lumbar facet arthropathy. Aortic Atherosclerosis (ICD10-I70.0). Electronically Signed   By: Elon Alas M.D.   On: 03/19/2018 16:27       Assessment & Plan:   [name] is a 52 y.o. female Type 2 diabetes mellitus without complication, without long-term current use of insulin ([place]) - Plan: Hemoglobin A1c, Comprehensive metabolic panel  -  Stable, tolerating current regimen. Medications refilled.   lab only visit next few weeks as early for A1c at this  time.  Low back pain, unspecified back pain laterality, unspecified chronicity, with sciatica presence unspecified - Plan: DG Lumbar Spine Complete Bilateral leg pain - Plan: DG Lumbar Spine Complete Dysesthesia - Plan: DG Lumbar Spine Complete  -X-ray indicates possible herniated disc at L5-S1.  Symptoms in the legs may be related partly to diabetes, but suspect primarily back source.  Treatment options discussed, would consider physical therapy initially with MRI if not improving, or any intolerance to PT.  If worsening symptoms, consider prednisone but would be cautious with diabetes.  Could also consider neuropathic pain meds such as gabapentin, but would want her to discuss that with her neurologist first as she is currently on Keppra.  No orders of the defined types were placed in this encounter.  Patient Instructions    Return for lab only visit in 2 weeks as a little early for blood sugar test today.  I will recheck liver test then too. Depending on those results we can discuss changes.   I would recommend pneumonia vaccine if not contraindicated.  Let me know after you discuss further with your neurologist.   Leg pain and tingling may be related to your back or possibly combination of back and neuropathy from diabetes.  I will check an x-ray, but it would be reasonable to try physical therapy as an initial approach to the symptoms.  Let me know if I can refer you, and I can do that without another office visit.  Another option is checking an MRI, but we can also check that if the physical therapy is not improving or worsening symptoms during therapy.   You can also discuss other medications for nerve pain with your neurologist as I would not start anything new given your current regimen. Return to the clinic or go to the nearest emergency room if any of your symptoms worsen or new symptoms occur.   Back Pain, Adult Many adults have back pain from time to time. Common causes of back  pain include:  A strained muscle or ligament.  Wear and tear (degeneration) of the spinal disks.  Arthritis.  A hit to the back.  Back pain can be short-lived (acute) or last a long time (chronic). A physical exam, lab tests, and imaging studies may be done to find the cause of your pain. Follow these instructions at home: Managing pain and stiffness  Take over-the-counter and prescription medicines only as told by your health care provider.  If directed, apply heat to the affected area as often as told by your health care provider. Use the heat source that your health care provider recommends, such as a moist heat pack or a heating pad. ? Place a towel between your skin and the heat source. ? Leave the heat on for 20-30 minutes. ? Remove the heat if your skin turns bright red. This is especially important if you are unable to feel pain, heat, or cold. You have a greater risk of getting burned.  If directed, apply ice to the injured area: ? Put ice in a plastic bag. ? Place a towel between your skin and the bag. ? Leave the ice on for 20 minutes, 2-3 times a day for the first 2-3 days. Activity  Do not stay in bed. Resting more than 1-2 days can delay your recovery.  Take short walks on even surfaces as soon as you are able. Try to increase the length of time you walk each day.  Do not sit, drive, or stand in one place for more than 30 minutes at a time. Sitting or standing for long periods of time can put stress on your back.  Use proper lifting techniques. When you bend and lift, use positions that put less stress on your back: ? [place]  your knees. ? Keep the load close to your body. ? Avoid twisting.  Exercise regularly as told by your health care provider. Exercising will help your back heal faster. This also helps prevent back injuries by keeping muscles strong and flexible.  Your health care provider may recommend that you see a physical therapist. This person can help you  come up with a safe exercise program. Do any exercises as told by your physical therapist. Lifestyle  Maintain a healthy weight. Extra weight puts stress on your back and makes it difficult to have good posture.  Avoid activities or situations that make you feel anxious or stressed. Learn ways to manage anxiety and stress. One way to manage stress is through exercise. Stress and anxiety increase muscle tension and can make back pain worse. General instructions  Sleep on a firm mattress in a comfortable position. Try lying on your side with your knees slightly bent. If you lie on your back, put a pillow under your knees.  Follow your treatment plan as told by your health care provider. This may include: ? Cognitive or behavioral therapy. ? Acupuncture or massage therapy. ? Meditation or yoga. Contact a health care provider if:  You have pain that is not relieved with rest or medicine.  You have increasing pain going down into your legs or buttocks.  Your pain does not improve in 2 weeks.  You have pain at night.  You lose weight.  You have a fever or chills. Get help right away if:  You develop new bowel or bladder control problems.  You have unusual weakness or numbness in your arms or legs.  You develop nausea or vomiting.  You develop abdominal pain.  You feel faint. Summary  Many adults have back pain from time to time. A physical exam, lab tests, and imaging studies may be done to find the cause of your pain.  Use proper lifting techniques. When you bend and lift, use positions that put less stress on your back.  Take over-the-counter and prescription medicines and apply heat or ice as directed by your health care provider. This information is not intended to replace advice given to you by your health care provider. Make sure you discuss any questions you have with your health care provider. Document Released: 08/29/2005 Document Revised: 10/03/2016 Document  Reviewed: 10/03/2016 Elsevier Interactive Patient Education  2018 [employer].    IF you received an x-ray today, you will receive an invoice from [facility]. Please contact [facility] at [phone] with questions or concerns regarding your invoice.   IF you received labwork today, you will receive an invoice from [place]. Please contact [facility] at [phone] with questions or concerns regarding your invoice.   Our billing staff will not be able to assist you with questions regarding bills from these companies.  You will be contacted with the lab results as soon as they are available. The fastest way to get your results is to activate your My Chart account. Instructions are located on the last page of this paperwork. If you have not heard from [place] regarding the results in 2 weeks, please contact this office.      I personally performed the services described in this documentation, which was scribed in my presence. The recorded information has been reviewed and considered for accuracy and completeness, addended by me as needed, and agree with information above.  Signed,   Merri Ray, MD [facility] [place].  03/21/18  2:37 PM

## 2018-03-19 NOTE — Patient Instructions (Addendum)
Return for lab only visit in 2 weeks as a little early for blood sugar test today.  I will recheck liver test then too. Depending on those results we can discuss changes.   I would recommend pneumonia vaccine if not contraindicated.  Let me know after you discuss further with your neurologist.   Leg pain and tingling may be related to your back or possibly combination of back and neuropathy from diabetes.  I will check an x-ray, but it would be reasonable to try physical therapy as an initial approach to the symptoms.  Let me know if I can refer you, and I can do that without another office visit.  Another option is checking an MRI, but we can also check that if the physical therapy is not improving or worsening symptoms during therapy.   You can also discuss other medications for nerve pain with your neurologist as I would not start anything new given your current regimen. Return to the clinic or go to the nearest emergency room if any of your symptoms worsen or new symptoms occur.   Back Pain, Adult Many adults have back pain from time to time. Common causes of back pain include:  A strained muscle or ligament.  Wear and tear (degeneration) of the spinal disks.  Arthritis.  A hit to the back.  Back pain can be short-lived (acute) or last a long time (chronic). A physical exam, lab tests, and imaging studies may be done to find the cause of your pain. Follow these instructions at home: Managing pain and stiffness  Take over-the-counter and prescription medicines only as told by your health care provider.  If directed, apply heat to the affected area as often as told by your health care provider. Use the heat source that your health care provider recommends, such as a moist heat pack or a heating pad. ? Place a towel between your skin and the heat source. ? Leave the heat on for 20-30 minutes. ? Remove the heat if your skin turns bright red. This is especially important if you are  unable to feel pain, heat, or cold. You have a greater risk of getting burned.  If directed, apply ice to the injured area: ? Put ice in a plastic bag. ? Place a towel between your skin and the bag. ? Leave the ice on for 20 minutes, 2-3 times a day for the first 2-3 days. Activity  Do not stay in bed. Resting more than 1-2 days can delay your recovery.  Take short walks on even surfaces as soon as you are able. Try to increase the length of time you walk each day.  Do not sit, drive, or stand in one place for more than 30 minutes at a time. Sitting or standing for long periods of time can put stress on your back.  Use proper lifting techniques. When you bend and lift, use positions that put less stress on your back: ? Clare your knees. ? Keep the load close to your body. ? Avoid twisting.  Exercise regularly as told by your health care provider. Exercising will help your back heal faster. This also helps prevent back injuries by keeping muscles strong and flexible.  Your health care provider may recommend that you see a physical therapist. This person can help you come up with a safe exercise program. Do any exercises as told by your physical therapist. Lifestyle  Maintain a healthy weight. Extra weight puts stress on your back and makes  it difficult to have good posture.  Avoid activities or situations that make you feel anxious or stressed. Learn ways to manage anxiety and stress. One way to manage stress is through exercise. Stress and anxiety increase muscle tension and can make back pain worse. General instructions  Sleep on a firm mattress in a comfortable position. Try lying on your side with your knees slightly bent. If you lie on your back, put a pillow under your knees.  Follow your treatment plan as told by your health care provider. This may include: ? Cognitive or behavioral therapy. ? Acupuncture or massage therapy. ? Meditation or yoga. Contact a health care provider  if:  You have pain that is not relieved with rest or medicine.  You have increasing pain going down into your legs or buttocks.  Your pain does not improve in 2 weeks.  You have pain at night.  You lose weight.  You have a fever or chills. Get help right away if:  You develop new bowel or bladder control problems.  You have unusual weakness or numbness in your arms or legs.  You develop nausea or vomiting.  You develop abdominal pain.  You feel faint. Summary  Many adults have back pain from time to time. A physical exam, lab tests, and imaging studies may be done to find the cause of your pain.  Use proper lifting techniques. When you bend and lift, use positions that put less stress on your back.  Take over-the-counter and prescription medicines and apply heat or ice as directed by your health care provider. This information is not intended to replace advice given to you by your health care provider. Make sure you discuss any questions you have with your health care provider. Document Released: 08/29/2005 Document Revised: 10/03/2016 Document Reviewed: 10/03/2016 Elsevier Interactive Patient Education  2018 [employer]    IF you received an x-ray today, you will receive an invoice from [facility]. Please contact [facility] at [phone] with questions or concerns regarding your invoice.   IF you received labwork today, you will receive an invoice from [facility]. Please contact [facility] at [phone] with questions or concerns regarding your invoice.   Our billing staff will not be able to assist you with questions regarding bills from these companies.  You will be contacted with the lab results as soon as they are available. The fastest way to get your results is to activate your My Chart account. Instructions are located on the last page of this paperwork. If you have not heard from us regarding the results in 2 weeks, please contact this  office.

## 2018-03-21 MED ORDER — EMPAGLIFLOZIN 10 MG PO TABS: 10.0000 mg | ORAL_TABLET | Freq: Every day | ORAL | 1 refills | Status: DC

## 2018-03-28 ENCOUNTER — Encounter: Payer: Self-pay | Admitting: *Deleted

## 2018-04-03 ENCOUNTER — Ambulatory Visit (INDEPENDENT_AMBULATORY_CARE_PROVIDER_SITE_OTHER): Payer: BLUE CROSS/BLUE SHIELD | Admitting: Family Medicine

## 2018-04-03 DIAGNOSIS — E119 Type 2 diabetes mellitus without complications: Secondary | ICD-10-CM

## 2018-04-03 LAB — COMPREHENSIVE METABOLIC PANEL
ALK PHOS: 119 IU/L — AB (ref 39–117)
ALT: 29 IU/L (ref 0–32)
AST: 29 IU/L (ref 0–40)
Albumin/Globulin Ratio: 1.7 (ref 1.2–2.2)
Albumin: 4.5 g/dL (ref 3.5–5.5)
BILIRUBIN TOTAL: 0.3 mg/dL (ref 0.0–1.2)
BUN/Creatinine Ratio: 10 (ref 9–23)
BUN: 7 mg/dL (ref 6–24)
CHLORIDE: 98 mmol/L (ref 96–106)
CO2: 23 mmol/L (ref 20–29)
CREATININE: 0.7 mg/dL (ref 0.57–1.00)
Calcium: 9.7 mg/dL (ref 8.7–10.2)
GFR calc Af Amer: 115 mL/min/{1.73_m2} (ref >59)
GFR calc non Af Amer: 100 mL/min/{1.73_m2} (ref >59)
Globulin, Total: 2.6 g/dL (ref 1.5–4.5)
Glucose: 131 mg/dL — ABNORMAL HIGH (ref 65–99)
Potassium: 4.1 mmol/L (ref 3.5–5.2)
Sodium: 140 mmol/L (ref 134–144)
Total Protein: 7.1 g/dL (ref 6.0–8.5)

## 2018-04-03 LAB — HEMOGLOBIN A1C
ESTIMATED AVERAGE GLUCOSE: 148 mg/dL
Hgb A1c MFr Bld: 6.8 % — ABNORMAL HIGH (ref 4.8–5.6)

## 2018-04-03 NOTE — Progress Notes (Signed)
Pt came for a lab only visit. Pt was not seen by provider.  

## 2018-04-23 ENCOUNTER — Telehealth: Payer: Self-pay | Admitting: Family Medicine

## 2018-04-23 DIAGNOSIS — M545 Low back pain: Secondary | ICD-10-CM

## 2018-04-23 NOTE — Telephone Encounter (Signed)
Please advise  Copied from CRM [phone]. Topic: Referral - Request >> Apr 23, 2018 10:53 AM Jay SchlichterWeikart, Melissa J wrote: Reason for CRM:  Pt is requesting ortho referral for her back. Please call [phone]

## 2018-04-25 NOTE — Telephone Encounter (Signed)
Referral placed.

## 2018-06-19 ENCOUNTER — Ambulatory Visit: Payer: BLUE CROSS/BLUE SHIELD | Admitting: Family Medicine

## 2018-07-19 ENCOUNTER — Other Ambulatory Visit: Payer: Self-pay | Admitting: Family Medicine

## 2018-07-19 DIAGNOSIS — Z1231 Encounter for screening mammogram for malignant neoplasm of breast: Secondary | ICD-10-CM

## 2018-07-31 ENCOUNTER — Other Ambulatory Visit: Payer: Self-pay | Admitting: Family Medicine

## 2018-07-31 DIAGNOSIS — F418 Other specified anxiety disorders: Secondary | ICD-10-CM

## 2018-07-31 NOTE — Progress Notes (Signed)
[facility]  PATIENT: [name] DOB: November 16, 1965   REASON FOR VISIT: Follow-up for epilepsy  burning in the feet HISTORY FROM: Patient    HISTORY OF PRESENT ILLNESS:UPDATE 11/20/2019CM [name], 52 year old female returns for follow-up with history of epilepsy, last seizure occurred November 29, 2015.  She is currently on Keppra 500 mg in the a.m. thousand milligrams at night.  She denies side effects to the medication.  She claims she is compliant with the medication she has recently been diagnosed with diabetes.  She continues to have some burning in her feet.  She claims her blood sugars are in good control for the most part.  She wears support stockings that she is on her feet 8 to 10 hours a day.  She returns for reevaluation    11/20/18CMMs. [name], 52 year old white female returns for followup. She has history of epilepsy with last seizure 11/29/2015 after being stressed over a close a friends death. Prior to that her last seizure was Jan 17, 2013 after missing several doses of her Keppra medication. She is currently on Keppra 500 mg a.m. and 1000 PM. She denies missing any doses of medication. She denies any side effects to the drug.  She has a new complaint of burning in her feet.  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  She has never used support stockings.  She has been told she is prediabetic by her primary care Dr. Nyoka Cowden.  She returns for reevaluation .  She now has insurance and her blood pressure is in good control because she is taking her hypertensive medications. HISTORY: This 52 year old cauc. right handed female presents with sudden onset of "seizures" fits, that started with an episode on May 12th at home, in 8 AM. She lost her husband on April 6th, and had not been able to stay in the marital home, slept at her sisters. Her husband suffered a sudden cardiac death in the home, in the presence of several family members. She came to  her own house on 12 th may, in the morning to get the post from her letter box. Than she has a memory gap- She found herself on the floor, was surprised to "sleep on the floor" , nobody witnessed this in effect. She had lots of bruising, no incontinence/ no tongue bite , but shoulder pain. She went to the PCP the next day, Xray of the shoulder showed no bone injuries, she did not want to have CT head taken, because of insurance difficulties.  On the 13 th April 10 she had a second spell, this is a Thursday night, she had gone with her sister to her house, she entered the house "fell suddenly,,with herlips blue, her eyes rolling," she fell into a glass table, suff. injuries plus urine and stool incontinence, brought to [facility] after 3 minutes of generalized convusions. She was released without further work up, had severe bruising in the face and right shouler. Her sister states she believes the seizure lasted 7-8 minutes - a rather unlikely duration.Next day she developed a fever, she is brought by private car to [facility], this time had a CT of the brain and lab work- with no abnormal results, except aspiration pneumonia.     REVIEW OF SYSTEMS: Full 14 system review of systems performed and notable only for those listed, all others are neg:  Constitutional: neg  Cardiovascular: neg Ear/Nose/Throat: neg  Skin: neg Eyes: neg Respiratory: ,  neg Gastroitestinal: neg  Hematology/Lymphatic: neg  Endocrine: Recently diagnosed with diabetes Musculoskeletal:neg Allergy/Immunology: Seasonal allergies Neurological: Seizure disorder, burning feet Psychiatric:  Sleep : neg   ALLERGIES: Allergies  Allergen Reactions  . Contrast Media [Iodinated Diagnostic Agents] Anaphylaxis    Contrast Dye   . Codeine   . Iohexol      Desc: UNABLE TO BREATH   . Other Other (See Comments)    Cashews and pistachios; reaction unknown    HOME MEDICATIONS: Outpatient Medications Prior to Visit  Medication Sig  Dispense Refill  . albuterol (PROVENTIL HFA;VENTOLIN HFA) 108 (90 Base) MCG/ACT inhaler Inhale 2 puffs into the lungs every 6 (six) hours as needed for wheezing or shortness of breath. 1 Inhaler 0  . atorvastatin (LIPITOR) 10 MG tablet TAKE 1 TABLET BY MOUTH ONCE DAILY AT  6  PM 90 tablet 1  . blood glucose meter kit and supplies Dispense based on patient and insurance preference. Use once per day. 1 each 0  . empagliflozin (JARDIANCE) 10 MG TABS tablet Take 10 mg by mouth daily. 90 tablet 1  . esomeprazole (NEXIUM) 20 MG packet Take 20 mg by mouth daily before breakfast.    . Fish Oil-Cholecalciferol (FISH OIL + D3) 1000-1000 MG-UNIT CAPS Take by mouth.    . fluticasone (FLOVENT HFA) 44 MCG/ACT inhaler Inhale 2 puffs into the lungs 2 (two) times daily. 1 Inhaler 6  . levETIRAcetam (KEPPRA) 500 MG tablet TAKE ONE TABLET BY MOUTH IN THE MORNING AND TWO TABLETS AT BEDTIME 90 tablet 11  . lisinopril-hydrochlorothiazide (PRINZIDE,ZESTORETIC) 20-25 MG tablet Take 1 tablet by mouth daily. 90 tablet 1  . Multiple Vitamin (MULTIVITAMIN) tablet Take 1 tablet by mouth daily.    . sertraline (ZOLOFT) 50 MG tablet Take 1 tablet (50 mg total) by mouth daily. 90 tablet 1   No facility-administered medications prior to visit.     PAST MEDICAL HISTORY: Past Medical History:  Diagnosis Date  . Asthma   . Diabetes mellitus without complication ([place])   . Hyperlipemia   . Hypertension   . Seizures ([place])     PAST SURGICAL HISTORY: Past Surgical History:  Procedure Laterality Date  . ABDOMINAL HYSTERECTOMY    . CHOLECYSTECTOMY      FAMILY HISTORY: Family History  Problem Relation Age of Onset  . Breast cancer Mother   . Hypertension Mother   . Heart failure Father   . Hypertension Father   . Hyperlipidemia Father     SOCIAL HISTORY: Social History   Socioeconomic History  . Marital status: Widowed    Spouse name: Not on file  . Number of children: 2  . Years of education: 72  . Highest  education level: Not on file  Occupational History  . Not on file  Social Needs  . Financial resource strain: Not on file  . Food insecurity:    Worry: Not on file    Inability: Not on file  . Transportation needs:    Medical: Not on file    Non-medical: Not on file  Tobacco Use  . Smoking status: Former Research scientist (life sciences)  . Smokeless tobacco: Never Used  Substance and Sexual Activity  . Alcohol use: Yes    Alcohol/week: 2.0 standard drinks    Types: 2 Cans of beer per week    Comment: beer, liquor  . Drug use: No  . Sexual activity: Yes    Birth control/protection: Surgical  Lifestyle  . Physical activity:    Days per week: Not  on file    Minutes per session: Not on file  . Stress: Not on file  Relationships  . Social connections:    Talks on phone: Not on file    Gets together: Not on file    Attends religious service: Not on file    Active member of club or organization: Not on file    Attends meetings of clubs or organizations: Not on file    Relationship status: Not on file  . Intimate partner violence:    Fear of current or ex partner: Not on file    Emotionally abused: Not on file    Physically abused: Not on file    Forced sexual activity: Not on file  Other Topics Concern  . Not on file  Social History Narrative   Patient is widowed.   Patient has 2 children.   Patient is currently unemployed.   Patient has a high school education.   Patient is right-handed.   Patient drinks 2 glass of caffeine daily ( soda and tea).     PHYSICAL EXAM  Vitals:   08/01/18 0918  BP: (!) 145/90  Pulse: 82  Weight: 170 lb (77.1 kg)  Height: 5' 6.5" (1.689 m)   Body mass index is 27.03 kg/m. Generalized: Well developed, in no acute distress  Head: normocephalic and atraumatic. Oropharynx benign  Neck: Supple,  Musculoskeletal: No deformity   Neurological examination  Mentation: Alert oriented to time, place, history taking. Follows all commands speech and language  fluent Cranial nerve II-XII:  Pupils were equal round reactive to light extraocular movements were full, visual field were full on confrontational test. Facial sensation and strength were normal. hearing was intact to finger rubbing bilaterally. Uvula tongue midline. head turning and shoulder shrug and were normal and symmetric.Tongue protrusion into cheek strength was normal. Motor: The motor testing reveals 5 over 5 strength of all 4 extremities. Good symmetric motor tone is noted throughout.  Sensory: Sensory testing is intact to soft touch, vibratory and proprioception.  Decreased pinprick to toes otherwise normal.  Coordination: Cerebellar testing reveals good finger-nose-finger and heel-to-shin bilaterally.  Gait and station: Gait is normal. Tandem gait is normal. Romberg is negative. Reflexes: Deep tendon reflexes are symmetric and normal bilaterally.  DIAGNOSTIC DATA (LABS, IMAGING, TESTING) - ASSESSMENT AND PLAN  52 y.o. year old female  has a past medical history of Asthma; Hyperlipemia; Hypertension; and Seizures ([place]). here to follow-up for seizure disorder. Last seizure event occurred November 29, 2015.Complaints of burning in her feet.  She has been diagnosed as diabetic.  Her symptoms likely related to diabetic neuropathy.  Continue Keppra at current dose will refill for one year Call for any seizure activity Follow-up yearly and when necessary For her burning in the feet we can get EMG nerve conduction test, however patient declines at this time. She will use support stockings  She will call for worsening symptoms of burning paresthesias and  additional testing can be done at that time Dennie Bible, [facility], [facility], [facility] [address] [place],  [address] [phone]

## 2018-08-01 ENCOUNTER — Encounter: Payer: Self-pay | Admitting: Nurse Practitioner

## 2018-08-01 ENCOUNTER — Ambulatory Visit: Payer: BLUE CROSS/BLUE SHIELD | Admitting: Nurse Practitioner

## 2018-08-01 VITALS — BP 145/90 | HR 82 | Ht 66.5 in | Wt 170.0 lb

## 2018-08-01 DIAGNOSIS — G40309 Generalized idiopathic epilepsy and epileptic syndromes, not intractable, without status epilepticus: Secondary | ICD-10-CM | POA: Diagnosis not present

## 2018-08-01 DIAGNOSIS — R202 Paresthesia of skin: Secondary | ICD-10-CM

## 2018-08-01 MED ORDER — LEVETIRACETAM 500 MG PO TABS: ORAL_TABLET | ORAL | 3 refills | Status: DC

## 2018-08-01 NOTE — Patient Instructions (Signed)
Continue Keppra at current dose will refill for one year Call for any seizure activity Follow-up yearly and when necessary For her burning in the feet we can get EMG nerve conduction test, however patient declines at this time. She will use support stockings  She will call for worsening symptoms of burning paresthesias and  additional testing can be done at that time

## 2018-08-01 NOTE — Telephone Encounter (Signed)
Accu-Check Aviva test strips are not on medication list.

## 2018-08-22 ENCOUNTER — Ambulatory Visit: Payer: BLUE CROSS/BLUE SHIELD | Admitting: Family Medicine

## 2018-08-22 ENCOUNTER — Other Ambulatory Visit: Payer: Self-pay

## 2018-08-22 ENCOUNTER — Encounter: Payer: Self-pay | Admitting: Family Medicine

## 2018-08-22 VITALS — BP 118/78 | HR 78 | Temp 97.5°F | Resp 14 | Ht 66.5 in | Wt 163.2 lb

## 2018-08-22 DIAGNOSIS — G6289 Other specified polyneuropathies: Secondary | ICD-10-CM | POA: Diagnosis not present

## 2018-08-22 DIAGNOSIS — Z114 Encounter for screening for human immunodeficiency virus [HIV]: Secondary | ICD-10-CM | POA: Diagnosis not present

## 2018-08-22 DIAGNOSIS — E119 Type 2 diabetes mellitus without complications: Secondary | ICD-10-CM

## 2018-08-22 DIAGNOSIS — J309 Allergic rhinitis, unspecified: Secondary | ICD-10-CM

## 2018-08-22 DIAGNOSIS — E785 Hyperlipidemia, unspecified: Secondary | ICD-10-CM | POA: Diagnosis not present

## 2018-08-22 DIAGNOSIS — Z23 Encounter for immunization: Secondary | ICD-10-CM

## 2018-08-22 DIAGNOSIS — H1013 Acute atopic conjunctivitis, bilateral: Secondary | ICD-10-CM

## 2018-08-22 DIAGNOSIS — E1165 Type 2 diabetes mellitus with hyperglycemia: Secondary | ICD-10-CM

## 2018-08-22 MED ORDER — GABAPENTIN 300 MG PO CAPS: 300.0000 mg | ORAL_CAPSULE | Freq: Two times a day (BID) | ORAL | 2 refills | Status: DC

## 2018-08-22 MED ORDER — EMPAGLIFLOZIN 10 MG PO TABS: 10.0000 mg | ORAL_TABLET | Freq: Every day | ORAL | 1 refills | Status: DC

## 2018-08-22 MED ORDER — ATORVASTATIN CALCIUM 10 MG PO TABS: ORAL_TABLET | ORAL | 1 refills | Status: DC

## 2018-08-22 NOTE — Progress Notes (Signed)
Subjective:    Patient ID: [name], female    DOB: [birth year], 52 y.o.   MRN: [id number]  HPI [name] is a 52 y.o. female Presents today for: Chief Complaint  Patient presents with  . Follow-up    medication refills= need refills on jardiance 10 mg, keppra 500 mg, zoloft 50 mg   Diabetes: Currently on Jardiance 10 mg daily, possible complication of diabetic neuropathy with burning sensation in her feet.  However we have also discussed lumbar source as possible herniated disc on L5-S1 when seen in July.  Offered PT, but wants to see ortho first. Referred to [facility]. Has not heard from them.   At most recent neurology visit was advised to call neurology if worsening paresthesias to determine if other testing needed.  EMG testing was declined.  Support stockings recommended. Burning pain in toes, but also some pain in legs - upper left thigh to hip at time. Tightness in lower back at times as well.  Home readings: 118-155.  No new side effects. One mild yeast infection resolved with OTC meds.    Lab Results  Component Value Date   HGBA1C 6.8 (H) 04/03/2018   HGBA1C 8.2 01/01/2018   HGBA1C 6.3 02/07/2017   Lab Results  Component Value Date   LDLCALC 112 (H) 01/18/2018   CREATININE 0.70 04/03/2018  Diabetic health maintenance, due for foot exam, flu vaccine, pneumonia vaccine.  We have discussed pneumonia vaccine in the past, but apparently had seizure with flu shots, so advised to discuss with neurology first.  Flu vaccine declined due to history of seizure with flu vaccine reported. Spoke to neuro, and they did not think flu or pneumonia vaccine caused seizure - will have done today.  Ophthalmology, reported at July visit that had screening in approximately May with no retinopathy. Plans to obtain new appointment for vision issues.   All rhinitis: Runny eyes, itching in eyes. Sneezing. Cat near home that she is  feeding.  Depression: Taking Zoloft 50 mg daily.  Mood has been doing ok. No new side effects of Zoloft.    Depression screen [facility] 2/9 08/22/2018 03/19/2018 01/18/2018 01/01/2018 11/14/2017  Decreased Interest 0 0 0 0 0  Down, Depressed, Hopeless 0 0 0 0 0  PHQ - 2 Score 0 0 0 0 0  Altered sleeping - - - - -  Tired, decreased energy - - - - -  Change in appetite - - - - -  Feeling bad or failure about yourself  - - - - -  Trouble concentrating - - - - -  Moving slowly or fidgety/restless - - - - -  Suicidal thoughts - - - - -  PHQ-9 Score - - - - -    Hyperlipidemia:  Lab Results  Component Value Date   CHOL 204 (H) 01/18/2018   HDL 35 (L) 01/18/2018   LDLCALC 112 (H) 01/18/2018   TRIG 287 (H) 01/18/2018   CHOLHDL 5.8 (H) 01/18/2018   Lab Results  Component Value Date   ALT 29 04/03/2018   AST 29 04/03/2018   ALKPHOS 119 (H) 04/03/2018   BILITOT 0.3 04/03/2018   Elevated LDL, on Lipitor 31m QD. Considered higher dose but with elevated liver tests remain on same dosing last visit.  Subsequent liver testing was normal.no new side effects and agreeable to increase if needed for Lipitor.   History of seizure disorder. Seen by neurology November 20.  Reportedly last event  was March 2017.  Recommended to continue Keppra same dose with refills given for 1 year. Tolerating current med regimen.  Patient Active Problem List   Diagnosis Date Noted  . Yeast vaginitis 09/09/2017  . Community acquired pneumonia of left lower lobe of lung ([place]) 09/01/2017  . Moderate persistent asthma with acute exacerbation 09/01/2017  . Paresthesias 08/01/2017  . 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 Asthma   . Diabetes mellitus without complication ([place])   . Hyperlipemia   . Hypertension   . Seizures ([place])    Past Surgical History:  Procedure Laterality Date  . ABDOMINAL HYSTERECTOMY    .  CHOLECYSTECTOMY     Allergies  Allergen Reactions  . Contrast Media [Iodinated Diagnostic Agents] Anaphylaxis    Contrast Dye   . Codeine   . Iohexol      Desc: UNABLE TO BREATH   . Other Other (See Comments)    Cashews and pistachios; reaction unknown   Prior to Admission medications   Medication Sig Start Date End Date Taking? Authorizing Provider  ACCU-CHEK AVIVA PLUS test strip USE  STRIP TO CHECK GLUCOSE ONCE DAILY 08/01/18  Yes [name], MD  albuterol (PROVENTIL HFA;VENTOLIN HFA) 108 (90 Base) MCG/ACT inhaler Inhale 2 puffs into the lungs every 6 (six) hours as needed for wheezing or shortness of breath. 01/18/18  Yes [name], MD  atorvastatin (LIPITOR) 10 MG tablet TAKE 1 TABLET BY MOUTH ONCE DAILY AT  6  PM 01/18/18  Yes [name], MD  blood glucose meter kit and supplies Dispense based on patient and insurance preference. Use once per day. 01/01/18  Yes [name], MD  empagliflozin (JARDIANCE) 10 MG TABS tablet Take 10 mg by mouth daily. 03/21/18  Yes [name], MD  esomeprazole (NEXIUM) 20 MG packet Take 20 mg by mouth daily before breakfast.   Yes [provider]  Fish Oil-Cholecalciferol (FISH OIL + D3) 1000-1000 MG-UNIT CAPS Take by mouth.   Yes [provider]  fluticasone (FLOVENT HFA) 44 MCG/ACT inhaler Inhale 2 puffs into the lungs 2 (two) times daily. 01/18/18  Yes [name], MD  levETIRAcetam (KEPPRA) 500 MG tablet TAKE ONE TABLET BY MOUTH IN THE MORNING AND TWO TABLETS AT BEDTIME 08/01/18  Yes Dennie Bible, NP  lisinopril-hydrochlorothiazide (PRINZIDE,ZESTORETIC) 20-25 MG tablet Take 1 tablet by mouth daily. 01/18/18  Yes [name], MD  Multiple Vitamin (MULTIVITAMIN) tablet Take 1 tablet by mouth daily.   Yes [provider]  sertraline (ZOLOFT) 50 MG tablet TAKE 1 TABLET BY MOUTH ONCE DAILY 08/01/18  Yes [name], MD   Social History   Socioeconomic History  . Marital  status: Widowed    Spouse name: Not on file  . Number of children: 2  . Years of education: 63  . Highest education level: Not on file  Occupational History  . Not on file  Social Needs  . Financial resource strain: Not on file  . Food insecurity:    Worry: Not on file    Inability: Not on file  . Transportation needs:    Medical: Not on file    Non-medical: Not on file  Tobacco Use  . Smoking status: Former Research scientist (life sciences)  . Smokeless tobacco: Never Used  Substance and Sexual Activity  . Alcohol use: Yes    Alcohol/week: 2.0 standard drinks  Types: 2 Cans of beer per week    Comment: beer, liquor  . Drug use: No  . Sexual activity: Yes    Birth control/protection: Surgical  Lifestyle  . Physical activity:    Days per week: Not on file    Minutes per session: Not on file  . Stress: Not on file  Relationships  . Social connections:    Talks on phone: Not on file    Gets together: Not on file    Attends religious service: Not on file    Active member of club or organization: Not on file    Attends meetings of clubs or organizations: Not on file    Relationship status: Not on file  . Intimate partner violence:    Fear of current or ex partner: Not on file    Emotionally abused: Not on file    Physically abused: Not on file    Forced sexual activity: Not on file  Other Topics Concern  . Not on file  Social History Narrative   Patient is widowed.   Patient has 2 children.   Patient is currently unemployed.   Patient has a high school education.   Patient is right-handed.   Patient drinks 2 glass of caffeine daily ( soda and tea).    Review of Systems Per HPI.     Objective:   Physical Exam  Constitutional: She is oriented to person, place, and time. She appears well-developed and well-nourished. No distress.  HENT:  Head: Normocephalic and atraumatic.  Right Ear: Hearing, tympanic membrane, external ear and ear canal normal.  Left Ear: Hearing, tympanic membrane,  external ear and ear canal normal.  Nose: Nose normal.  Mouth/Throat: Oropharynx is clear and moist. No oropharyngeal exudate.  Eyes: Pupils are equal, round, and reactive to light. Conjunctivae and EOM are normal.  Red/injected, no discolored d/c.   Neck: Carotid bruit is not present.  Cardiovascular: Normal rate, regular rhythm, normal heart sounds and intact distal pulses.  No murmur heard. Pulmonary/Chest: Effort normal and breath sounds normal. No respiratory distress. She has no wheezes. She has no rhonchi.  Abdominal: Soft. She exhibits no pulsatile midline mass. There is no tenderness.  Musculoskeletal:  Negative seated SLR.   Neurological: She is alert and oriented to person, place, and time.  Skin: Skin is warm and dry. No rash noted.  Psychiatric: She has a normal mood and affect. Her behavior is normal.  Vitals reviewed.  Vitals:   08/22/18 0933  BP: 118/78  Pulse: 78  Resp: 14  Temp: (!) 97.5 F (36.4 C)  TempSrc: Oral  SpO2: 97%  Weight: 163 lb 3.2 oz (74 kg)  Height: 5' 6.5" (1.689 m)      Assessment & Plan:   [name] is a 52 y.o. female Type 2 diabetes mellitus without complication, without long-term current use of insulin (HCC) - Plan: Hemoglobin A1c  -Tolerating current regimen with only 1 reported mycotic infection that was easily treated.  Check A1c, no changes for now.  Hyperlipidemia, unspecified hyperlipidemia type - Plan: Lipid panel, Comprehensive metabolic panel, atorvastatin (LIPITOR) 10 MG tablet  -Tolerating 10 mg Lipitor, but if still above goal would increase to 20 mg.  Recheck 6 weeks if dosing change.  labs pending.  Need for pneumococcal vaccination - Plan: Pneumococcal polysaccharide vaccine 23-valent greater than or equal to 2yo subcutaneous/IM  Needs flu shot - Plan: Flu Vaccine QUAD 36+ mos IM  Other polyneuropathy - Plan: gabapentin (NEURONTIN) 300  MG capsule  -Differential of neuropathy from diabetes versus lumbar  source/radiculopathy.  Previously referred to neurosurgery, phone number provided to schedule appointment.  Option of gabapentin 300 mg at bedtime, up to twice daily after 1 week as long as she is cleared to use that from her neurologist given other treatments for seizures.  Potential side effects discussed  Allergic rhinitis, unspecified seasonality, unspecified trigger Allergic conjunctivitis of both eyes  -Over-the-counter treatments discussed including Zaditor for allergies, Flonase nasal spray for congestion/allergic rhinitis  Screening for HIV (human immunodeficiency virus) - Plan: HIV Antibody (routine testing w rflx)  Need for diphtheria-tetanus-pertussis (Tdap) vaccine - Plan: Tdap vaccine greater than or equal to 7yo IM    Meds ordered this encounter  Medications  . gabapentin (NEURONTIN) 300 MG capsule    Sig: Take 1 capsule (300 mg total) by mouth 2 (two) times daily. Start one QHS for first week.    Dispense:  60 capsule    Refill:  2  . DISCONTD: empagliflozin (JARDIANCE) 10 MG TABS tablet    Sig: Take 10 mg by mouth daily.    Dispense:  90 tablet    Refill:  1  . atorvastatin (LIPITOR) 10 MG tablet    Sig: TAKE 1 TABLET BY MOUTH ONCE DAILY AT  6  PM    Dispense:  90 tablet    Refill:  1  . empagliflozin (JARDIANCE) 10 MG TABS tablet    Sig: Take 10 mg by mouth daily.    Dispense:  90 tablet    Refill:  1   Patient Instructions     I will check the hemoglobin A1c test and depend on the results we can discuss any med changes.  Continue the Jardiance once per day for now.   For the cholesterol if the LDL is still above 70 I would recommend increasing Lipitor to 20 mg a day and I can send that to your pharmacy.   As long as it is okay with your neurologist can try gabapentin once at bedtime, then increase to twice per day after 1 week  if needed for burning pain in the feet.  However the pain can also be due to some of your back issues.  Please call neurosurgeon  listed below as we sent a referral there in August.  Let me know if a new referral is needed  [facility]  Address: [address], [place], [place] [address]  Phone: [phone]   Try Zaditor for allergy eye drops, flonase nasal spray can also help other allergy symptoms.  follow up with eye doctor.    If you have lab work done today you will be contacted with your lab results within the next 2 weeks.  If you have not heard from [place] then please contact us. The fastest way to get your results is to register for My Chart.   IF you received an x-ray today, you will receive an invoice from [facility]. Please contact [facility] at [phone] with questions or concerns regarding your invoice.   IF you received labwork today, you will receive an invoice from [facility]. Please contact [facility] at [phone] with questions or concerns regarding your invoice.   Our billing staff will not be able to assist you with questions regarding bills from these companies.  You will be contacted with the lab results as soon as they are available. The fastest way to get your results is to activate your My Chart  account. Instructions are located on the last page of this paperwork. If you have not heard from [place] regarding the results in 2 weeks, please contact this office.       Signed,   Merri Ray, MD [facility].  08/23/18 10:54 PM

## 2018-08-22 NOTE — Patient Instructions (Addendum)
   I will check the hemoglobin A1c test and depend on the results we can discuss any med changes.  Continue the Jardiance once per day for now.   For the cholesterol if the LDL is still above 70 I would recommend increasing Lipitor to 20 mg a day and I can send that to your pharmacy.   As long as it is okay with your neurologist can try gabapentin once at bedtime, then increase to twice per day after 1 week  if needed for burning pain in the feet.  However the pain can also be due to some of your back issues.  Please call neurosurgeon listed below as we sent a referral there in August.  Let me know if a new referral is needed  [facility]  Address: [address], [place], [place] [address]  Phone: [phone]   Try Zaditor for allergy eye drops, flonase nasal spray can also help other allergy symptoms.  follow up with eye doctor.    If you have lab work done today you will be contacted with your lab results within the next 2 weeks.  If you have not heard from us then please contact us. The fastest way to get your results is to register for My Chart.   IF you received an x-ray today, you will receive an invoice from [facility]. Please contact [facility] at [phone] with questions or concerns regarding your invoice.   IF you received labwork today, you will receive an invoice from [facility]. Please contact [facility] at [phone] with questions or concerns regarding your invoice.   Our billing staff will not be able to assist you with questions regarding bills from these companies.  You will be contacted with the lab results as soon as they are available. The fastest way to get your results is to activate your My Chart account. Instructions are located on the last page of this paperwork. If you have not heard from us regarding the results in 2 weeks, please contact this office.

## 2018-08-23 ENCOUNTER — Encounter: Payer: Self-pay | Admitting: Family Medicine

## 2018-08-23 LAB — COMPREHENSIVE METABOLIC PANEL
ALT: 36 IU/L — ABNORMAL HIGH (ref 0–32)
AST: 35 IU/L (ref 0–40)
Albumin/Globulin Ratio: 2 (ref 1.2–2.2)
Albumin: 4.7 g/dL (ref 3.5–5.5)
Alkaline Phosphatase: 132 IU/L — ABNORMAL HIGH (ref 39–117)
BILIRUBIN TOTAL: 0.2 mg/dL (ref 0.0–1.2)
BUN / CREAT RATIO: 14 (ref 9–23)
BUN: 9 mg/dL (ref 6–24)
CO2: 22 mmol/L (ref 20–29)
CREATININE: 0.65 mg/dL (ref 0.57–1.00)
Calcium: 10.5 mg/dL — ABNORMAL HIGH (ref 8.7–10.2)
Chloride: 95 mmol/L — ABNORMAL LOW (ref 96–106)
GFR calc non Af Amer: 102 mL/min/{1.73_m2} (ref >59)
GFR, EST AFRICAN AMERICAN: 118 mL/min/{1.73_m2} (ref >59)
GLOBULIN, TOTAL: 2.4 g/dL (ref 1.5–4.5)
GLUCOSE: 116 mg/dL — AB (ref 65–99)
POTASSIUM: 3.8 mmol/L (ref 3.5–5.2)
SODIUM: 140 mmol/L (ref 134–144)
Total Protein: 7.1 g/dL (ref 6.0–8.5)

## 2018-08-23 LAB — LIPID PANEL
Chol/HDL Ratio: 6 ratio — ABNORMAL HIGH (ref 0.0–4.4)
Cholesterol, Total: 205 mg/dL — ABNORMAL HIGH (ref 100–199)
HDL: 34 mg/dL — ABNORMAL LOW (ref >39)
Triglycerides: 409 mg/dL — ABNORMAL HIGH (ref 0–149)

## 2018-08-23 LAB — HEMOGLOBIN A1C
Est. average glucose Bld gHb Est-mCnc: 143 mg/dL
HEMOGLOBIN A1C: 6.6 % — AB (ref 4.8–5.6)

## 2018-08-23 LAB — HIV ANTIBODY (ROUTINE TESTING W REFLEX): HIV Screen 4th Generation wRfx: NONREACTIVE

## 2018-09-03 ENCOUNTER — Ambulatory Visit
Admission: RE | Admit: 2018-09-03 | Discharge: 2018-09-03 | Disposition: A | Discharge status: Home / Self Care | Payer: BLUE CROSS/BLUE SHIELD | Source: Ambulatory Visit | Attending: Family Medicine | Admitting: Family Medicine

## 2018-09-03 DIAGNOSIS — Z1231 Encounter for screening mammogram for malignant neoplasm of breast: Secondary | ICD-10-CM

## 2018-09-06 ENCOUNTER — Encounter: Payer: Self-pay | Admitting: *Deleted

## 2018-10-03 ENCOUNTER — Other Ambulatory Visit: Payer: Self-pay

## 2018-10-03 ENCOUNTER — Encounter: Payer: Self-pay | Admitting: Family Medicine

## 2018-10-03 ENCOUNTER — Ambulatory Visit: Payer: BLUE CROSS/BLUE SHIELD | Admitting: Family Medicine

## 2018-10-03 VITALS — BP 116/78 | HR 70 | Temp 97.7°F | Resp 14 | Ht 66.5 in | Wt 163.4 lb

## 2018-10-03 DIAGNOSIS — G6289 Other specified polyneuropathies: Secondary | ICD-10-CM

## 2018-10-03 DIAGNOSIS — E785 Hyperlipidemia, unspecified: Secondary | ICD-10-CM

## 2018-10-03 DIAGNOSIS — E1165 Type 2 diabetes mellitus with hyperglycemia: Secondary | ICD-10-CM

## 2018-10-03 MED ORDER — GABAPENTIN 300 MG PO CAPS: 300.0000 mg | ORAL_CAPSULE | Freq: Every day | ORAL | 1 refills | Status: DC

## 2018-10-03 MED ORDER — ATORVASTATIN CALCIUM 20 MG PO TABS: 20.0000 mg | ORAL_TABLET | Freq: Every day | ORAL | 1 refills | Status: DC

## 2018-10-03 MED ORDER — EMPAGLIFLOZIN 10 MG PO TABS: 10.0000 mg | ORAL_TABLET | Freq: Every day | ORAL | 1 refills | Status: DC

## 2018-10-03 NOTE — Patient Instructions (Addendum)
  I am glad to hear the new medication is helping me foot symptoms.  No change in doses of medications at this time.  I did send a 20 mg Lipitor to the pharmacy, so can take just 1/day of that medication.  Recheck in 2 months for repeat diabetes visit.  Thank you for coming in today   If you have lab work done today you will be contacted with your lab results within the next 2 weeks.  If you have not heard from [place] then please contact us. The fastest way to get your results is to register for My Chart.   IF you received an x-ray today, you will receive an invoice from [facility]. Please contact [facility] at [phone] with questions or concerns regarding your invoice.   IF you received labwork today, you will receive an invoice from [facility]. Please contact [facility] at [phone] with questions or concerns regarding your invoice.   Our billing staff will not be able to assist you with questions regarding bills from these companies.  You will be contacted with the lab results as soon as they are available. The fastest way to get your results is to activate your My Chart account. Instructions are located on the last page of this paperwork. If you have not heard from [place] regarding the results in 2 weeks, please contact this office.

## 2018-10-03 NOTE — Progress Notes (Addendum)
Subjective:    Patient ID: [name], female    DOB: April 04, 1966, 53 y.o.   MRN: [id number]  Chief Complaint  Patient presents with  . Hyperlipidemia     6 week f/u   . Foot Burn    been doing better since I started on gabapentin    HPI [name] is a 53 y.o. female who is here at [place] at [facility] complaining of HLD and foot pain.  HLD: She takes Atrovastatin 10MG daily. I recommend increase to 20 MG daily.  She has been taking 2 pills without any side effects at that dose including no new myalgias.  Lab Results  Component Value Date   CHOL 205 (H) 08/22/2018   HDL 34 (L) 08/22/2018   LDLCALC Comment 08/22/2018   TRIG 409 (H) 08/22/2018   CHOLHDL 6.0 (H) 08/22/2018   Lab Results  Component Value Date   ALT 36 (H) 08/22/2018   AST 35 08/22/2018   ALKPHOS 132 (H) 08/22/2018   BILITOT 0.2 08/22/2018    DM: Last seen 08/2018. Was tolerating new regiment at that time. Still has some neuropathic pain that is treated with gabapentin 300 mg. She is tolerating gabapentin well. She denies yeast or fungal infections.  Feels like the gabapentin has improved or nearly resolved her symptoms in her feet and is only taking that once per day.  Lab Results  Component Value Date   HGBA1C 6.6 (H) 08/22/2018    Patient Active Problem List   Diagnosis Date Noted  . Yeast vaginitis 09/09/2017  . Community acquired pneumonia of left lower lobe of lung ([place]) 09/01/2017  . Moderate persistent asthma with acute exacerbation 09/01/2017  . Paresthesias 08/01/2017  . 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 Asthma   . Diabetes mellitus without complication ([place])   . Hyperlipemia   . Hypertension   . Seizures ([place])    Past Surgical History:  Procedure Laterality Date  . ABDOMINAL HYSTERECTOMY    . CHOLECYSTECTOMY     Allergies  Allergen Reactions  . Contrast Media [Iodinated  Diagnostic Agents] Anaphylaxis    Contrast Dye   . Codeine   . Iohexol      Desc: UNABLE TO BREATH   . Other Other (See Comments)    Cashews and pistachios; reaction unknown   Prior to Admission medications   Medication Sig Start Date End Date Taking? Authorizing Provider  ACCU-CHEK AVIVA PLUS test strip USE  STRIP TO CHECK GLUCOSE ONCE DAILY 08/01/18  Yes [name], MD  albuterol (PROVENTIL HFA;VENTOLIN HFA) 108 (90 Base) MCG/ACT inhaler Inhale 2 puffs into the lungs every 6 (six) hours as needed for wheezing or shortness of breath. 01/18/18  Yes [name], MD  atorvastatin (LIPITOR) 10 MG tablet TAKE 1 TABLET BY MOUTH ONCE DAILY AT  6  PM 08/22/18  Yes [name], MD  blood glucose meter kit and supplies Dispense based on patient and insurance preference. Use once per day. 01/01/18  Yes [name], MD  empagliflozin (JARDIANCE) 10 MG TABS tablet Take 10 mg by mouth daily. 08/22/18  Yes [name], MD  esomeprazole (NEXIUM) 20 MG packet Take 20 mg by mouth daily before breakfast.   Yes [provider]  Fish Oil-Cholecalciferol (FISH OIL + D3) 1000-1000 MG-UNIT CAPS Take by mouth.   Yes [provider]  fluticasone (FLOVENT  HFA) 44 MCG/ACT inhaler Inhale 2 puffs into the lungs 2 (two) times daily. 01/18/18  Yes [name], MD  gabapentin (NEURONTIN) 300 MG capsule Take 1 capsule (300 mg total) by mouth 2 (two) times daily. Start one QHS for first week. 08/22/18  Yes [name], MD  levETIRAcetam (KEPPRA) 500 MG tablet TAKE ONE TABLET BY MOUTH IN THE MORNING AND TWO TABLETS AT BEDTIME 08/01/18  Yes Dennie Bible, NP  lisinopril-hydrochlorothiazide (PRINZIDE,ZESTORETIC) 20-25 MG tablet Take 1 tablet by mouth daily. 01/18/18  Yes [name], MD  Multiple Vitamin (MULTIVITAMIN) tablet Take 1 tablet by mouth daily.   Yes [provider]  sertraline (ZOLOFT) 50 MG tablet TAKE 1 TABLET BY MOUTH ONCE DAILY 08/01/18   Yes [name], MD   Social History   Socioeconomic History  . Marital status: Widowed    Spouse name: Not on file  . Number of children: 2  . Years of education: 68  . Highest education level: Not on file  Occupational History  . Not on file  Social Needs  . Financial resource strain: Not on file  . Food insecurity:    Worry: Not on file    Inability: Not on file  . Transportation needs:    Medical: Not on file    Non-medical: Not on file  Tobacco Use  . Smoking status: Former Research scientist (life sciences)  . Smokeless tobacco: Never Used  Substance and Sexual Activity  . Alcohol use: Yes    Alcohol/week: 2.0 standard drinks    Types: 2 Cans of beer per week    Comment: beer, liquor  . Drug use: No  . Sexual activity: Yes    Birth control/protection: Surgical  Lifestyle  . Physical activity:    Days per week: Not on file    Minutes per session: Not on file  . Stress: Not on file  Relationships  . Social connections:    Talks on phone: Not on file    Gets together: Not on file    Attends religious service: Not on file    Active member of club or organization: Not on file    Attends meetings of clubs or organizations: Not on file    Relationship status: Not on file  . Intimate partner violence:    Fear of current or ex partner: Not on file    Emotionally abused: Not on file    Physically abused: Not on file    Forced sexual activity: Not on file  Other Topics Concern  . Not on file  Social History Narrative   Patient is widowed.   Patient has 2 children.   Patient is currently unemployed.   Patient has a high school education.   Patient is right-handed.   Patient drinks 2 glass of caffeine daily ( soda and tea).     Review of Systems As HPI    Objective:     Physical Exam  Constitutional: She is oriented to person, place, and time. She appears well-developed and well-nourished.  HENT:  Head: Normocephalic and atraumatic.  Eyes: Pupils are equal, round, and  reactive to light. Conjunctivae and EOM are normal.  Neck: Carotid bruit is not present.  Cardiovascular: Normal rate, regular rhythm, normal heart sounds and intact distal pulses.  Pulmonary/Chest: Effort normal and breath sounds normal.  Abdominal: Soft. She exhibits no pulsatile midline mass. There is no abdominal tenderness.  Neurological: She is alert and oriented to person, place, and time.  Skin:  Skin is warm and dry.  Psychiatric: She has a normal mood and affect. Her behavior is normal.  Vitals reviewed.    Vitals:   10/03/18 1044  BP: 116/78  Pulse: 70  Resp: 14  Temp: 97.7 F (36.5 C)  TempSrc: Oral  SpO2: 97%  Weight: 163 lb 6.4 oz (74.1 kg)  Height: 5' 6.5" (1.689 m)       Assessment & Plan:   [name] is a 53 y.o. female Hyperlipidemia, unspecified hyperlipidemia type - Plan: Comprehensive metabolic panel, Lipid panel, atorvastatin (LIPITOR) 20 MG tablet  -Tolerating higher dose of statin, refilled same.  Labs pending  Type 2 diabetes mellitus with hyperglycemia, without long-term current use of insulin (HCC) - Plan: empagliflozin (JARDIANCE) 10 MG TABS tablet Other polyneuropathy - Plan: gabapentin (NEURONTIN) 300 MG capsule  -Tolerating Jardiance without new mycotic infections.  Gabapentin has also been helpful once at bedtime for neuropathic pain.  No changes at this time, recheck in 2 months for repeat A1c.  Meds ordered this encounter  Medications  . empagliflozin (JARDIANCE) 10 MG TABS tablet    Sig: Take 10 mg by mouth daily.    Dispense:  90 tablet    Refill:  1  . atorvastatin (LIPITOR) 20 MG tablet    Sig: Take 1 tablet (20 mg total) by mouth daily.    Dispense:  90 tablet    Refill:  1  . gabapentin (NEURONTIN) 300 MG capsule    Sig: Take 1 capsule (300 mg total) by mouth at bedtime. Start one QHS for first week.    Dispense:  90 capsule    Refill:  1   Patient Instructions    I am glad to hear the new medication is helping me foot  symptoms.  No change in doses of medications at this time.  I did send a 20 mg Lipitor to the pharmacy, so can take just 1/day of that medication.  Recheck in 2 months for repeat diabetes visit.  Thank you for coming in today   If you have lab work done today you will be contacted with your lab results within the next 2 weeks.  If you have not heard from [place] then please contact us. The fastest way to get your results is to register for My Chart.   IF you received an x-ray today, you will receive an invoice from [facility]. Please contact [facility] at [phone] with questions or concerns regarding your invoice.   IF you received labwork today, you will receive an invoice from [place]. Please contact [facility] at [phone] with questions or concerns regarding your invoice.   Our billing staff will not be able to assist you with questions regarding bills from these companies.  You will be contacted with the lab results as soon as they are available. The fastest way to get your results is to activate your My Chart account. Instructions are located on the last page of this paperwork. If you have not heard from [place] regarding the results in 2 weeks, please contact this office.       I personally performed the services described in this documentation, which was scribed in my presence. The recorded information has been reviewed and considered for accuracy and completeness, addended by me as needed, and agree with information above.  Signed,   Merri Ray, MD [facility] [place].  10/03/18 2:01 PM     I, Carmon Sails am acting as Education administrator  for Dr. Merri Ray.

## 2018-10-04 LAB — COMPREHENSIVE METABOLIC PANEL
A/G RATIO: 1.2 (ref 1.2–2.2)
ALT: 24 IU/L (ref 0–32)
AST: 22 IU/L (ref 0–40)
Albumin: 4.1 g/dL (ref 3.8–4.9)
Alkaline Phosphatase: 74 IU/L (ref 39–117)
BUN/Creatinine Ratio: 9 (ref 9–23)
BUN: 14 mg/dL (ref 6–24)
Bilirubin Total: 0.3 mg/dL (ref 0.0–1.2)
CO2: 21 mmol/L (ref 20–29)
Calcium: 9.7 mg/dL (ref 8.7–10.2)
Chloride: 101 mmol/L (ref 96–106)
Creatinine, Ser: 1.63 mg/dL — ABNORMAL HIGH (ref 0.57–1.00)
GFR calc Af Amer: 41 mL/min/{1.73_m2} — ABNORMAL LOW (ref >59)
GFR, EST NON AFRICAN AMERICAN: 36 mL/min/{1.73_m2} — AB (ref >59)
Globulin, Total: 3.4 g/dL (ref 1.5–4.5)
Glucose: 90 mg/dL (ref 65–99)
Potassium: 5.2 mmol/L (ref 3.5–5.2)
Sodium: 140 mmol/L (ref 134–144)
Total Protein: 7.5 g/dL (ref 6.0–8.5)

## 2018-10-04 LAB — LIPID PANEL
Chol/HDL Ratio: 4.9 ratio — ABNORMAL HIGH (ref 0.0–4.4)
Cholesterol, Total: 199 mg/dL (ref 100–199)
HDL: 41 mg/dL (ref >39)
LDL Calculated: 110 mg/dL — ABNORMAL HIGH (ref 0–99)
Triglycerides: 241 mg/dL — ABNORMAL HIGH (ref 0–149)
VLDL Cholesterol Cal: 48 mg/dL — ABNORMAL HIGH (ref 5–40)

## 2018-10-08 ENCOUNTER — Other Ambulatory Visit: Payer: Self-pay | Admitting: Family Medicine

## 2018-10-08 DIAGNOSIS — R7989 Other specified abnormal findings of blood chemistry: Secondary | ICD-10-CM

## 2018-12-03 ENCOUNTER — Ambulatory Visit: Payer: BLUE CROSS/BLUE SHIELD | Admitting: Family Medicine

## 2018-12-30 IMAGING — DX DG CHEST 2V
2 series · 2 of 2 positions shown · non-contrast
Comparison: 10/08/2015

CLINICAL DATA: Cough, chest pain

EXAM:
CHEST  2 VIEW

[chest pa]
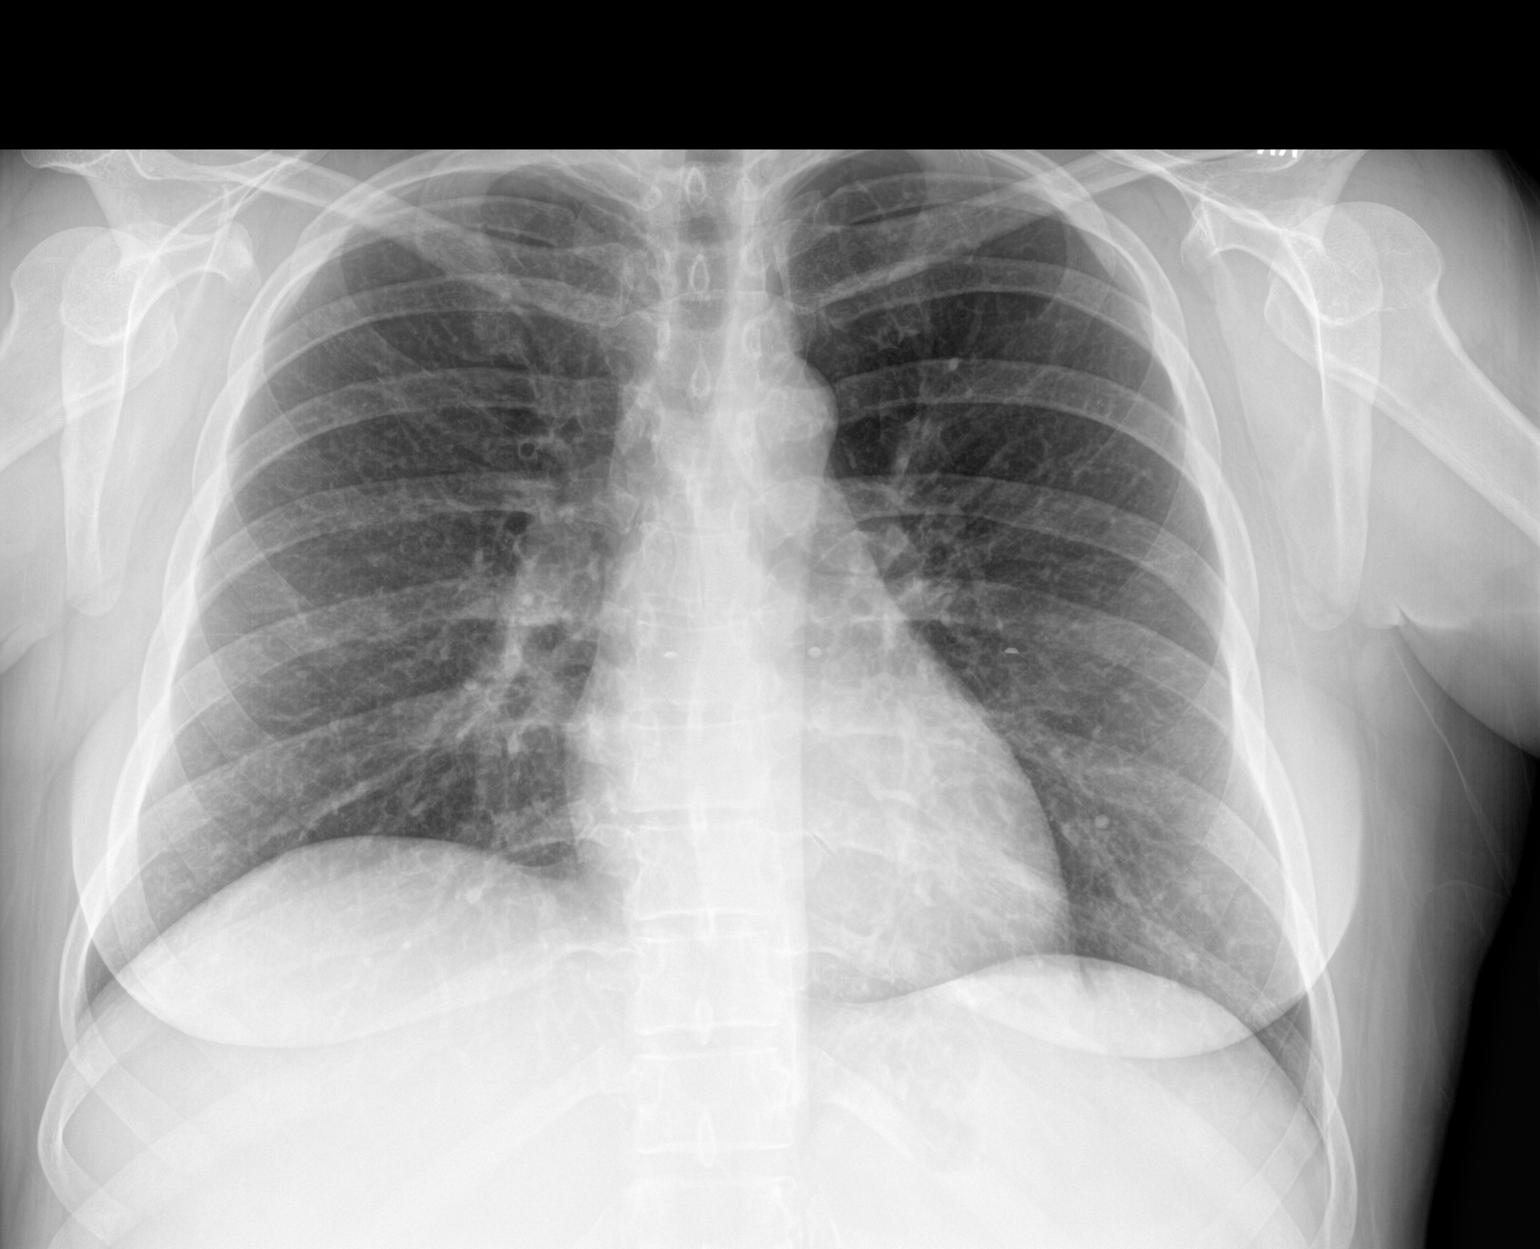

[chest lat]
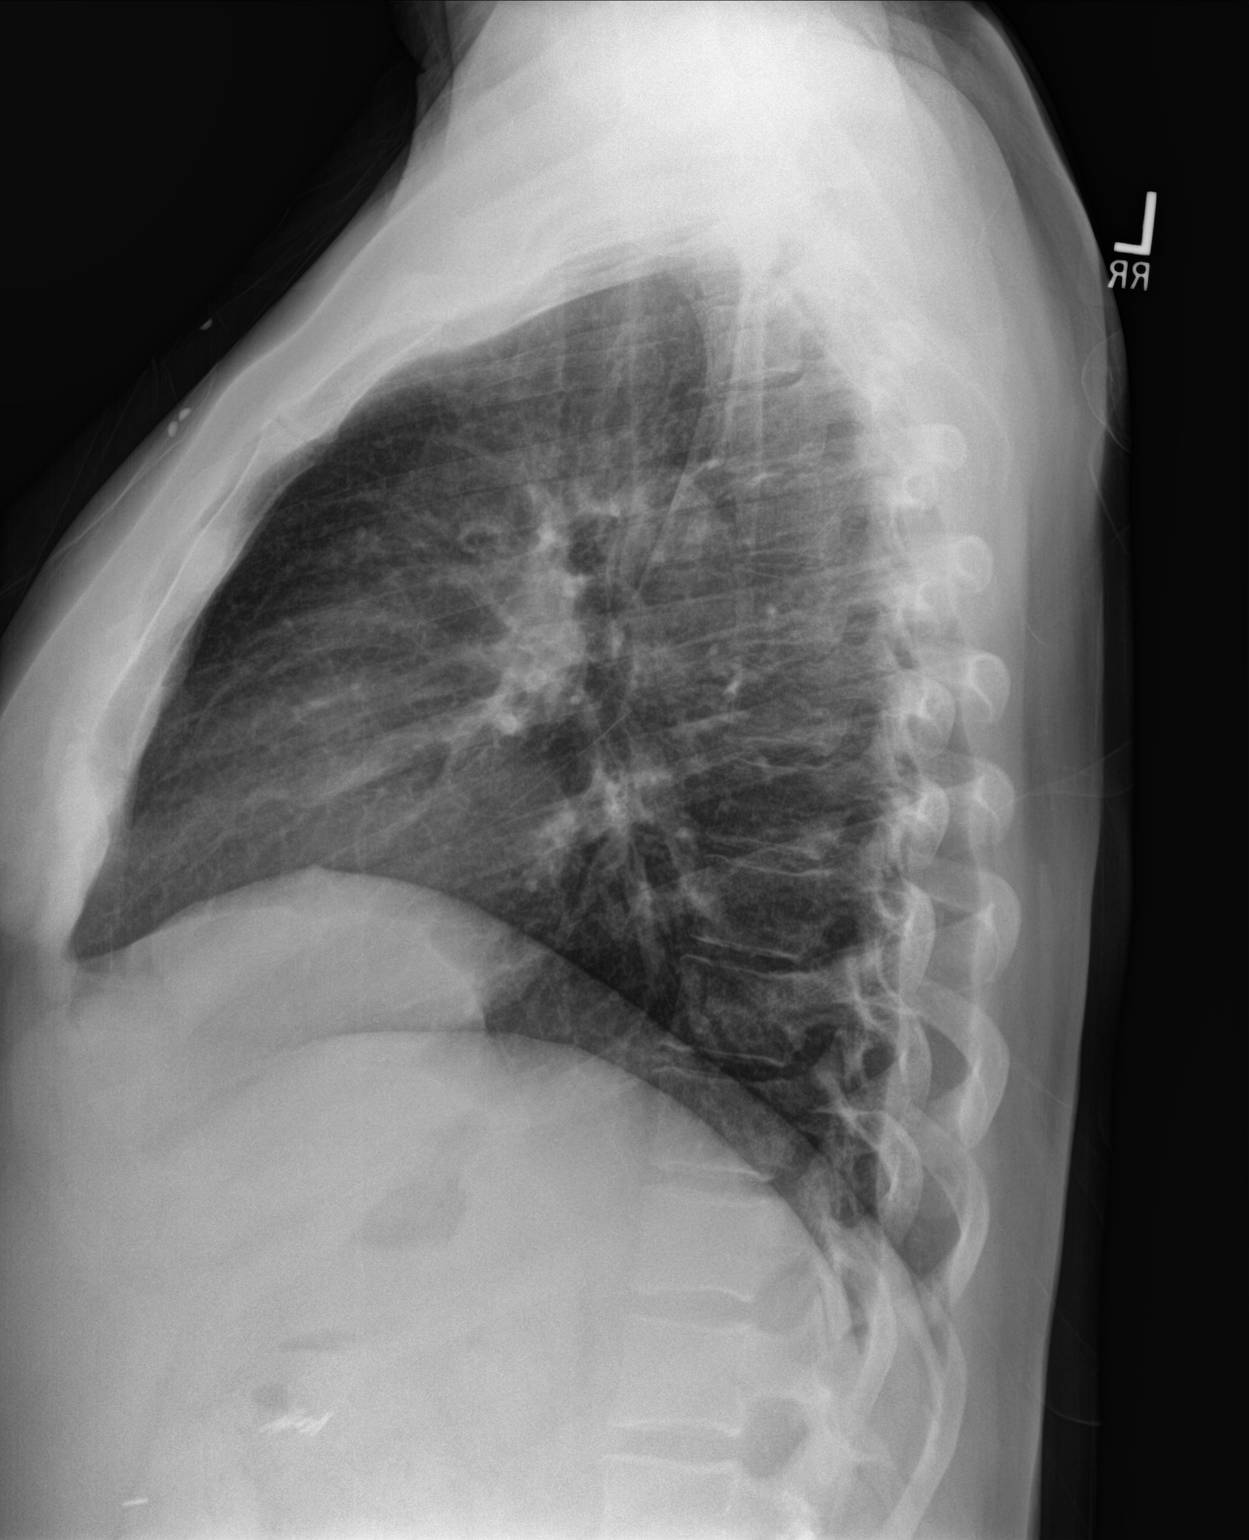

[2 of 2 positions shown; findings below may reference images not displayed]

FINDINGS: Mild peribronchial thickening. Heart and mediastinal contours are
within normal limits. No focal opacities or effusions. No acute bony
abnormality.
IMPRESSION: Mild bronchitic changes.

## 2019-01-18 ENCOUNTER — Telehealth (INDEPENDENT_AMBULATORY_CARE_PROVIDER_SITE_OTHER): Payer: BLUE CROSS/BLUE SHIELD | Admitting: Family Medicine

## 2019-01-18 ENCOUNTER — Other Ambulatory Visit: Payer: Self-pay

## 2019-01-18 DIAGNOSIS — I10 Essential (primary) hypertension: Secondary | ICD-10-CM

## 2019-01-18 DIAGNOSIS — R7989 Other specified abnormal findings of blood chemistry: Secondary | ICD-10-CM

## 2019-01-18 DIAGNOSIS — G629 Polyneuropathy, unspecified: Secondary | ICD-10-CM | POA: Diagnosis not present

## 2019-01-18 DIAGNOSIS — E1165 Type 2 diabetes mellitus with hyperglycemia: Secondary | ICD-10-CM

## 2019-01-18 DIAGNOSIS — G6289 Other specified polyneuropathies: Secondary | ICD-10-CM

## 2019-01-18 DIAGNOSIS — E785 Hyperlipidemia, unspecified: Secondary | ICD-10-CM

## 2019-01-18 MED ORDER — GABAPENTIN 300 MG PO CAPS: 300.0000 mg | ORAL_CAPSULE | Freq: Two times a day (BID) | ORAL | 1 refills | Status: DC

## 2019-01-18 MED ORDER — LISINOPRIL-HYDROCHLOROTHIAZIDE 20-25 MG PO TABS: 1.0000 | ORAL_TABLET | Freq: Every day | ORAL | 1 refills | Status: DC

## 2019-01-18 MED ORDER — EMPAGLIFLOZIN 10 MG PO TABS: 10.0000 mg | ORAL_TABLET | Freq: Every day | ORAL | 1 refills | Status: DC

## 2019-01-18 NOTE — Progress Notes (Signed)
DM f/u  

## 2019-01-18 NOTE — Patient Instructions (Addendum)
Try 2 of the gabapentin at night, remain on one in the morning for now. If not improved in next week, then can take 2 twice per day.    No other changes in medications for now.  Can determine if changes are needed once I see the lab work from this upcoming Monday.  Walking for exercise few days per week can be helpful for stress management as well as exercise for diabetes.  That should be easier once we get on a better dose of meds for the foot symptoms.  Let me know if those do not improve.  Follow-up with me in 3 months, possibly sooner depending on lab work from Monday.  Let me know if there are questions and take care.    If you have lab work done today you will be contacted with your lab results within the next 2 weeks.  If you have not heard from [place] then please contact us. The fastest way to get your results is to register for My Chart.   IF you received an x-ray today, you will receive an invoice from [facility]. Please contact [facility]iology at [phone] with questions or concerns regarding your invoice.   IF you received labwork today, you will receive an invoice from [place]. Please contact [facility] at [phone] with questions or concerns regarding your invoice.   Our billing staff will not be able to assist you with questions regarding bills from these companies.  You will be contacted with the lab results as soon as they are available. The fastest way to get your results is to activate your My Chart account. Instructions are located on the last page of this paperwork. If you have not heard from [place] regarding the results in 2 weeks, please contact this office.

## 2019-01-18 NOTE — Progress Notes (Signed)
Virtual Visit via Telephone Note  I connected with [name] on 01/18/19 at 11:52 AM by telephone and verified that I am speaking with the correct person using two identifiers.   I discussed the limitations, risks, security and privacy concerns of performing an evaluation and management service by telephone and the availability of in person appointments. I also discussed with the patient that there may be a patient responsible charge related to this service. The patient expressed understanding and agreed to proceed, consent obtained  Chief complaint:  Diabetes  History of Present Illness: [name] is a 53 y.o. female  Diabetes: Previously complicated by hyperglycemia and polyneuropathy.  Treated with Jardiance, gabapentin 300 mg nightly for neuropathic pain. On lisinopril HCTZ for ACE inhibitor, Lipitor 20 mg for statin, dose was increased to 20 mg prior to last visit. No new side effects with meds.  1 yeast infection treated with otc meds since last visit.  Gabapentin increased to 320m BID at night for burning in feet over past month. Some improvement, but still persistent symptoms.  Home readings - 135 lowest, up to 272 with dietary indiscretion - with being stuck at home.   bloodwork scheduled this Monday.   Microalbumin:plan on Monday.  Optho, foot exam, pneumovax: up to date  Lab Results  Component Value Date   HGBA1C 6.6 (H) 08/22/2018   HGBA1C 6.8 (H) 04/03/2018   HGBA1C 8.2 01/01/2018   Lab Results  Component Value Date   LDLCALC 110 (H) 10/03/2018   CREATININE 1.63 (H) 10/03/2018    Hypertension: BP Readings from Last 3 Encounters:  10/03/18 116/78  08/22/18 118/78  08/01/18 (!) 145/90   Lab Results  Component Value Date   CREATININE 1.63 (H) 10/03/2018  elevated creatinine last visit. Plan on lab only visit - has not had. Will have labs on Monday. urinating normally.   On lisinopril hct 20/223mqd.  No home readings.  Constitutional:  Negative for fatigue and unexpected weight change.  Eyes: Negative for visual disturbance.  Respiratory: Negative for cough, chest tightness and shortness of breath.   Cardiovascular: Negative for chest pain, palpitations and leg swelling.  Gastrointestinal: Negative for abdominal pain and blood in stool.  Neurological: Negative for dizziness, light-headedness and headaches.   Hyperlipidemia:  Lab Results  Component Value Date   CHOL 199 10/03/2018   HDL 41 10/03/2018   LDLCALC 110 (H) 10/03/2018   TRIG 241 (H) 10/03/2018   CHOLHDL 4.9 (H) 10/03/2018   Lab Results  Component Value Date   ALT 24 10/03/2018   AST 22 10/03/2018   ALKPHOS 74 10/03/2018   BILITOT 0.3 10/03/2018  on lipitor 2021md - plans on repeat test in few days.     Patient Active Problem List   Diagnosis Date Noted  . Yeast vaginitis 09/09/2017  . Community acquired pneumonia of left lower lobe of lung ([place]/2018  . Moderate persistent asthma with acute exacerbation 09/01/2017  . Paresthesias 08/01/2017  . 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 Generalized convulsive epilepsy ([place]1/08/2013   Past Medical History:  Diagnosis Date  . Asthma   . Diabetes mellitus without complication ([place] . Hyperlipemia   . Hypertension   . Seizures ([place]  Past Surgical History:  Procedure Laterality Date  . ABDOMINAL HYSTERECTOMY    . CHOLECYSTECTOMY     Allergies  Allergen Reactions  . Contrast Media [Iodinated Diagnostic Agents] Anaphylaxis    Contrast Dye   .  Codeine   . Iohexol      Desc: UNABLE TO BREATH   . Other Other (See Comments)    Cashews and pistachios; reaction unknown   Prior to Admission medications   Medication Sig Start Date End Date Taking? Authorizing Provider  ACCU-CHEK AVIVA PLUS test strip USE  STRIP TO CHECK GLUCOSE ONCE DAILY 08/01/18  Yes [name], MD  atorvastatin (LIPITOR) 20 MG tablet Take 1 tablet (20 mg total) by mouth daily. 10/03/18  Yes [name], MD  blood glucose meter kit and supplies Dispense based on patient and insurance preference. Use once per day. 01/01/18  Yes [name], MD  empagliflozin (JARDIANCE) 10 MG TABS tablet Take 10 mg by mouth daily. 10/03/18  Yes [name], MD  esomeprazole (NEXIUM) 20 MG packet Take 20 mg by mouth daily before breakfast.   Yes [provider]  gabapentin (NEURONTIN) 300 MG capsule Take 1 capsule (300 mg total) by mouth at bedtime. Start one QHS for first week. 10/03/18  Yes [name], MD  levETIRAcetam (KEPPRA) 500 MG tablet TAKE ONE TABLET BY MOUTH IN THE MORNING AND TWO TABLETS AT BEDTIME 08/01/18  Yes Dennie Bible, NP  lisinopril-hydrochlorothiazide (PRINZIDE,ZESTORETIC) 20-25 MG tablet Take 1 tablet by mouth daily. 01/18/18  Yes [name], MD  Multiple Vitamin (MULTIVITAMIN) tablet Take 1 tablet by mouth daily.   Yes [provider]  sertraline (ZOLOFT) 50 MG tablet TAKE 1 TABLET BY MOUTH ONCE DAILY 08/01/18  Yes [name], MD  albuterol (PROVENTIL HFA;VENTOLIN HFA) 108 (90 Base) MCG/ACT inhaler Inhale 2 puffs into the lungs every 6 (six) hours as needed for wheezing or shortness of breath. Patient not taking: Reported on 01/18/2019 01/18/18   [name], MD   Social History   Socioeconomic History  . Marital status: Widowed    Spouse name: Not on file  . Number of children: 2  . Years of education: 46  . Highest education level: Not on file  Occupational History  . Not on file  Social Needs  . Financial resource strain: Not on file  . Food insecurity:    Worry: Not on file    Inability: Not on file  . Transportation needs:    Medical: Not on file    Non-medical: Not on file  Tobacco Use  . Smoking status: Former Research scientist (life sciences)  . Smokeless tobacco: Never Used  Substance and Sexual Activity  . Alcohol use: Yes    Alcohol/week: 2.0 standard drinks    Types: 2 Cans of beer per week    Comment: beer, liquor  . Drug  use: No  . Sexual activity: Yes    Birth control/protection: Surgical  Lifestyle  . Physical activity:    Days per week: Not on file    Minutes per session: Not on file  . Stress: Not on file  Relationships  . Social connections:    Talks on phone: Not on file    Gets together: Not on file    Attends religious service: Not on file    Active member of club or organization: Not on file    Attends meetings of clubs or organizations: Not on file    Relationship status: Not on file  . Intimate partner violence:    Fear of current or ex partner: Not on file    Emotionally abused: Not on file    Physically abused: Not on file    Forced sexual  activity: Not on file  Other Topics Concern  . Not on file  Social History Narrative   Patient is widowed.   Patient has 2 children.   Patient is currently unemployed.   Patient has a high school education.   Patient is right-handed.   Patient drinks 2 glass of caffeine daily ( soda and tea).     Observations/Objective: No distress, appropriate responses.   Assessment and Plan: Type 2 diabetes mellitus with hyperglycemia, without long-term current use of insulin (HCC) - Plan: Hemoglobin A1c, Microalbumin / creatinine urine ratio, empagliflozin (JARDIANCE) 10 MG TABS tablet Polyneuropathy  -Trial of increased dose of gabapentin, initially 600 mg nightly 300 mg in the morning, then after a week or 2 if not improving can increase to 6 and milligrams twice daily.  Side effects and risk discussed.    -Single mycotic infections since last visit, discussed potential need for change in agent if persistent mycotic infections but will continue Jardiance same dose for now.  A1c pending this Monday.  Essential hypertension - Plan: lisinopril-hydrochlorothiazide (ZESTORETIC) 20-25 MG tablet   - tolerating current regimen. Continue same.   Elevated serum creatinine - Plan: Comprehensive metabolic panel  - repeat testing.   Hyperlipidemia, unspecified  hyperlipidemia type - Plan: Lipid panel  - tolerating 47m lipitor. May need increase depending on labs in 3 days.   Other polyneuropathy - Plan: gabapentin (NEURONTIN) 300 MG capsule  - as above.   Follow Up Instructions:    I discussed the assessment and treatment plan with the patient. The patient was provided an opportunity to ask questions and all were answered. The patient agreed with the plan and demonstrated an understanding of the instructions.   The patient was advised to call back or seek an in-person evaluation if the symptoms worsen or if the condition fails to improve as anticipated.  I provided 11 minutes of non-face-to-face time during this encounter.  Signed,   JMerri Ray MD [facility]  01/18/19

## 2019-01-21 ENCOUNTER — Other Ambulatory Visit: Payer: Self-pay

## 2019-01-21 ENCOUNTER — Ambulatory Visit: Payer: BLUE CROSS/BLUE SHIELD

## 2019-01-21 DIAGNOSIS — R7989 Other specified abnormal findings of blood chemistry: Secondary | ICD-10-CM | POA: Diagnosis not present

## 2019-01-21 DIAGNOSIS — E785 Hyperlipidemia, unspecified: Secondary | ICD-10-CM | POA: Diagnosis not present

## 2019-01-21 DIAGNOSIS — E1165 Type 2 diabetes mellitus with hyperglycemia: Secondary | ICD-10-CM

## 2019-01-22 LAB — MICROALBUMIN / CREATININE URINE RATIO
Creatinine, Urine: 258.4 mg/dL
Microalb/Creat Ratio: 13 mg/g creat (ref 0–29)
Microalbumin, Urine: 33.8 ug/mL

## 2019-01-22 LAB — HEMOGLOBIN A1C
Est. average glucose Bld gHb Est-mCnc: 140 mg/dL
Hgb A1c MFr Bld: 6.5 % — ABNORMAL HIGH (ref 4.8–5.6)

## 2019-01-22 LAB — COMPREHENSIVE METABOLIC PANEL
ALT: 35 IU/L — ABNORMAL HIGH (ref 0–32)
AST: 27 IU/L (ref 0–40)
Albumin/Globulin Ratio: 2 (ref 1.2–2.2)
Albumin: 4.7 g/dL (ref 3.8–4.9)
Alkaline Phosphatase: 128 IU/L — ABNORMAL HIGH (ref 39–117)
BUN/Creatinine Ratio: 18 (ref 9–23)
BUN: 12 mg/dL (ref 6–24)
Bilirubin Total: 0.4 mg/dL (ref 0.0–1.2)
CO2: 21 mmol/L (ref 20–29)
Calcium: 10 mg/dL (ref 8.7–10.2)
Chloride: 95 mmol/L — ABNORMAL LOW (ref 96–106)
Creatinine, Ser: 0.67 mg/dL (ref 0.57–1.00)
GFR calc Af Amer: 116 mL/min/{1.73_m2} (ref >59)
GFR calc non Af Amer: 101 mL/min/{1.73_m2} (ref >59)
Globulin, Total: 2.4 g/dL (ref 1.5–4.5)
Glucose: 159 mg/dL — ABNORMAL HIGH (ref 65–99)
Potassium: 3.9 mmol/L (ref 3.5–5.2)
Sodium: 135 mmol/L (ref 134–144)
Total Protein: 7.1 g/dL (ref 6.0–8.5)

## 2019-01-22 LAB — LIPID PANEL
Chol/HDL Ratio: 5.1 ratio — ABNORMAL HIGH (ref 0.0–4.4)
Cholesterol, Total: 180 mg/dL (ref 100–199)
HDL: 35 mg/dL — ABNORMAL LOW (ref >39)
LDL Calculated: 68 mg/dL (ref 0–99)
Triglycerides: 387 mg/dL — ABNORMAL HIGH (ref 0–149)
VLDL Cholesterol Cal: 77 mg/dL — ABNORMAL HIGH (ref 5–40)

## 2019-01-25 NOTE — Progress Notes (Signed)
spoke with pt, will make 3 month flu with fasting labs

## 2019-03-19 ENCOUNTER — Telehealth: Payer: Self-pay | Admitting: Family Medicine

## 2019-03-19 NOTE — Telephone Encounter (Signed)
Pt states she was asked to call in in regards to her yeast infection. She states that the medicine prescribed is not working. She has tried OTC with no relief. Needs new prescription

## 2019-03-20 ENCOUNTER — Other Ambulatory Visit: Payer: Self-pay | Admitting: Family Medicine

## 2019-03-20 DIAGNOSIS — F418 Other specified anxiety disorders: Secondary | ICD-10-CM

## 2019-03-22 NOTE — Telephone Encounter (Signed)
Please needs an OV appointment

## 2019-03-22 NOTE — Telephone Encounter (Signed)
Appointment

## 2019-03-27 ENCOUNTER — Telehealth: Payer: Self-pay | Admitting: Family Medicine

## 2019-03-27 NOTE — Telephone Encounter (Signed)
Medication Refill - Medication: empagliflozin (JARDIANCE) 10 MG TABS tablet [[id number]]    The patient has been calling trying to reach Dr. Carlota Raspberry or his assistant for over 2 weeks. The patient was told to call back if the medication continued to give her a yeast infection and it has. She is not sure what to do. She urgently needs a call back.   Agent: Please be advised that RX refills may take up to 3 business days. We ask that you follow-up with your pharmacy.

## 2019-03-28 ENCOUNTER — Telehealth: Payer: Self-pay

## 2019-03-28 DIAGNOSIS — B3731 Acute candidiasis of vulva and vagina: Secondary | ICD-10-CM

## 2019-03-28 DIAGNOSIS — B373 Candidiasis of vulva and vagina: Secondary | ICD-10-CM

## 2019-03-28 DIAGNOSIS — I10 Essential (primary) hypertension: Secondary | ICD-10-CM

## 2019-03-28 MED ORDER — FLUCONAZOLE 150 MG PO TABS: 150.0000 mg | ORAL_TABLET | Freq: Once | ORAL | 0 refills | Status: AC

## 2019-03-28 MED ORDER — LISINOPRIL-HYDROCHLOROTHIAZIDE 20-25 MG PO TABS: 1.0000 | ORAL_TABLET | Freq: Every day | ORAL | 1 refills | Status: DC

## 2019-03-28 NOTE — Telephone Encounter (Signed)
Patient was advised.  

## 2019-03-28 NOTE — Telephone Encounter (Signed)
Note reviewed from message 7/7 - 7/10 advised appt needed. I will send in diflucan, with repeat in 1 week if needed for any persistent symptoms, but should be seen in next few days in person if not improving - sooner if worse. . Can also use otc 7 day topical antifungal monistat at the same time which may also help.

## 2019-03-28 NOTE — Telephone Encounter (Signed)
Spoke with pt about her jardiance and pt states that she has had a bad yeast infection for the past 2 weeks and it is getting worst. Pt has tried vagisil with no relief. Can you send her in an Rx? Please advise.

## 2019-04-11 ENCOUNTER — Other Ambulatory Visit: Payer: Self-pay | Admitting: Family Medicine

## 2019-04-11 DIAGNOSIS — E785 Hyperlipidemia, unspecified: Secondary | ICD-10-CM

## 2019-04-19 ENCOUNTER — Ambulatory Visit: Payer: BLUE CROSS/BLUE SHIELD | Admitting: Family Medicine

## 2019-04-19 ENCOUNTER — Encounter: Payer: Self-pay | Admitting: Family Medicine

## 2019-04-19 ENCOUNTER — Other Ambulatory Visit: Payer: Self-pay

## 2019-04-19 VITALS — BP 124/81 | HR 83 | Temp 98.1°F | Resp 14 | Wt 175.6 lb

## 2019-04-19 DIAGNOSIS — Z8742 Personal history of other diseases of the female genital tract: Secondary | ICD-10-CM

## 2019-04-19 DIAGNOSIS — E1165 Type 2 diabetes mellitus with hyperglycemia: Secondary | ICD-10-CM | POA: Diagnosis not present

## 2019-04-19 DIAGNOSIS — E1142 Type 2 diabetes mellitus with diabetic polyneuropathy: Secondary | ICD-10-CM

## 2019-04-19 MED ORDER — SITAGLIPTIN PHOSPHATE 100 MG PO TABS: 100.0000 mg | ORAL_TABLET | Freq: Every day | ORAL | 1 refills | Status: DC

## 2019-04-19 NOTE — Progress Notes (Signed)
Subjective:    Patient ID: [name], female    DOB: 10/29/1965, 53 y.o.   MRN: [id number]  HPI [name] is a 53 y.o. female Presents today for: Chief Complaint  Patient presents with  . Hyperlipidemia    3 month f/u hyperlipidedemia. Patient stated she stopped jardiance casue it was causing her yeast infection. Blood sugar yday was 223   Diabetes: Complicated by hyperglycemia and polyneuropathy. Unfortunately patient did have some issues with mycotic infection with Jardiance.  Diflucan was prescribed mid July.  Stopped Jardiance about 2 weeks ago.  Home readings - 160, 173, 186, high of 223 yesterday with diet change.  No n/v/increased thirst/blurry vision.  Previously tried metformin in the past but concerned with chronic diarrhea.- still has some chronic diarrhea.  Gabapentin dosage was increased in May from 300 mg to 600 mg twice daily as needed Taking 1 in the morning and 2 at night, helping foot symptoms. Some chronic back pain - plans to follow up to discuss in detail.  Microalbumin: nl ratio 01/21/19.  Optho, foot exam, pneumovax: overall up to date. optho - plans to reschedule d/t covid. Last appt 01/2018.   Had hysterectomy., last pap in 2016  Had "vaginal scraping" in 2015, recommended repeat procedure in 2016 after abnormal pap in 2016, but she refused repeat procedure. OBGYN - [facility], no recent visit.    Lab Results  Component Value Date   HGBA1C 6.5 (H) 01/21/2019   HGBA1C 6.6 (H) 08/22/2018   HGBA1C 6.8 (H) 04/03/2018   Lab Results  Component Value Date   LDLCALC 68 01/21/2019   CREATININE 0.67 01/21/2019    Hyperlipidemia:  Lab Results  Component Value Date   CHOL 180 01/21/2019   HDL 35 (L) 01/21/2019   LDLCALC 68 01/21/2019   TRIG 387 (H) 01/21/2019   CHOLHDL 5.1 (H) 01/21/2019   Lab Results  Component Value Date   ALT 35 (H) 01/21/2019   AST 27 01/21/2019   ALKPHOS 128 (H) 01/21/2019   BILITOT 0.4 01/21/2019  lipitor 44m qd. No new  myalgias/side effects.    Patient Active Problem List   Diagnosis Date Noted  . Yeast vaginitis 09/09/2017  . Community acquired pneumonia of left lower lobe of lung [place] 09/01/2017  . Moderate persistent asthma with acute exacerbation 09/01/2017  . Paresthesias 08/01/2017  . 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 Asthma   . Diabetes mellitus without complication ([place]   . Hyperlipemia   . Hypertension   . Seizures ([place]    Past Surgical History:  Procedure Laterality Date  . ABDOMINAL HYSTERECTOMY    . CHOLECYSTECTOMY     Allergies  Allergen Reactions  . Contrast Media [Iodinated Diagnostic Agents] Anaphylaxis    Contrast Dye   . Codeine   . Iohexol      Desc: UNABLE TO BREATH   . Other Other (See Comments)    Cashews and pistachios; reaction unknown   Prior to Admission medications   Medication Sig Start Date End Date Taking? Authorizing Provider  ACCU-CHEK AVIVA PLUS test strip USE  STRIP TO CHECK GLUCOSE ONCE DAILY 08/01/18  Yes GWendie Agreste MD  atorvastatin (LIPITOR) 20 MG tablet Take 1 tablet by mouth once daily 04/11/19  Yes GWendie Agreste MD  blood glucose meter kit and supplies Dispense based on patient and insurance preference. Use once per day. 01/01/18  Yes [name], MD  esomeprazole (NEXIUM) 20 MG packet Take 20 mg by mouth daily before breakfast.   Yes [provider]  gabapentin (NEURONTIN) 300 MG capsule Take 1-2 capsules (300-600 mg total) by mouth 2 (two) times daily. 01/18/19  Yes [name], MD  levETIRAcetam (KEPPRA) 500 MG tablet TAKE ONE TABLET BY MOUTH IN THE MORNING AND TWO TABLETS AT BEDTIME 08/01/18  Yes Dennie Bible, NP  lisinopril-hydrochlorothiazide (ZESTORETIC) 20-25 MG tablet Take 1 tablet by mouth daily. 03/28/19  Yes [name], MD  Multiple Vitamin (MULTIVITAMIN) tablet Take 1 tablet by mouth daily.    Yes [provider]  sertraline (ZOLOFT) 50 MG tablet Take 1 tablet (50 mg total) by mouth daily. KEEP UPCOMING APPT 03/20/19  Yes [name], MD   Social History   Socioeconomic History  . Marital status: Widowed    Spouse name: Not on file  . Number of children: 2  . Years of education: 78  . Highest education level: Not on file  Occupational History  . Not on file  Social Needs  . Financial resource strain: Not on file  . Food insecurity    Worry: Not on file    Inability: Not on file  . Transportation needs    Medical: Not on file    Non-medical: Not on file  Tobacco Use  . Smoking status: Former Research scientist (life sciences)  . Smokeless tobacco: Never Used  Substance and Sexual Activity  . Alcohol use: Yes    Alcohol/week: 2.0 standard drinks    Types: 2 Cans of beer per week    Comment: beer, liquor  . Drug use: No  . Sexual activity: Yes    Birth control/protection: Surgical  Lifestyle  . Physical activity    Days per week: Not on file    Minutes per session: Not on file  . Stress: Not on file  Relationships  . Social Herbalist on phone: Not on file    Gets together: Not on file    Attends religious service: Not on file    Active member of club or organization: Not on file    Attends meetings of clubs or organizations: Not on file    Relationship status: Not on file  . Intimate partner violence    Fear of current or ex partner: Not on file    Emotionally abused: Not on file    Physically abused: Not on file    Forced sexual activity: Not on file  Other Topics Concern  . Not on file  Social History Narrative   Patient is widowed.   Patient has 2 children.   Patient is currently unemployed.   Patient has a high school education.   Patient is right-handed.   Patient drinks 2 glass of caffeine daily ( soda and tea).    Review of Systems  Constitutional: Negative for fatigue and unexpected weight change.  Respiratory: Negative for chest tightness  and shortness of breath.   Cardiovascular: Negative for chest pain, palpitations and leg swelling.  Gastrointestinal: Negative for abdominal pain and blood in stool.  Neurological: Negative for dizziness, syncope, light-headedness and headaches.      Objective:   Physical Exam Vitals signs reviewed.  Constitutional:      Appearance: She is well-developed.  HENT:     Head: Normocephalic and atraumatic.  Eyes:     Conjunctiva/sclera: Conjunctivae normal.     Pupils: Pupils are equal, round, and reactive  to light.  Neck:     Vascular: No carotid bruit.  Cardiovascular:     Rate and Rhythm: Normal rate and regular rhythm.     Heart sounds: Normal heart sounds.  Pulmonary:     Effort: Pulmonary effort is normal.     Breath sounds: Normal breath sounds.  Abdominal:     Palpations: Abdomen is soft. There is no pulsatile mass.     Tenderness: There is no abdominal tenderness.  Skin:    General: Skin is warm and dry.  Neurological:     Mental Status: She is alert and oriented to person, place, and time.  Psychiatric:        Behavior: Behavior normal.    Vitals:   04/19/19 1011  BP: 124/81  Pulse: 83  Resp: 14  Temp: 98.1 F (36.7 C)  TempSrc: Oral  SpO2: 98%  Weight: 175 lb 9.6 oz (79.7 kg)          Assessment & Plan:   [name] is a 53 y.o. female Type 2 diabetes mellitus with hyperglycemia, without long-term current use of insulin ([place]) - Plan: Hemoglobin A1c, Comprehensive metabolic panel, sitaGLIPtin (JANUVIA) 100 MG tablet  -Unfortunately intolerant to SGLT2 as above.  Try Januvia 100 mg daily, check A1c, labs, RTC precautions  Diabetic polyneuropathy associated with type 2 diabetes mellitus ([place])  -Stable with gabapentin, continue same.  History of abnormal cervical Pap smear  -On review of health maintenance initially appeared to be due for Pap testing.  Does report hysterectomy for benign reasons, but apparently has subsequent Pap testing with  abnormality and need for possible biopsy.  Discussed possibility of vulvar disease even without cervix.   - Plan for her to follow-up with her OB/GYN to discuss testing again and other recommendations.  If procedure needed, could discuss potential sedation if needed.  Meds ordered this encounter  Medications  . sitaGLIPtin (JANUVIA) 100 MG tablet    Sig: Take 1 tablet (100 mg total) by mouth daily.    Dispense:  90 tablet    Refill:  1   Patient Instructions      please call your OBGYN for follow up to discuss plan from prior abnormal pap and recommendations, as well as likely repeat exam.   Start januvia once per day for diabetes.   If you have lab work done today you will be contacted with your lab results within the next 2 weeks.  If you have not heard from [place] then please contact us. The fastest way to get your results is to register for My Chart.   IF you received an x-ray today, you will receive an invoice from [facility]. Please contact [facility] at [phone] with questions or concerns regarding your invoice.   IF you received labwork today, you will receive an invoice from [facility]. Please contact [facility] at [phone] with questions or concerns regarding your invoice.   Our billing staff will not be able to assist you with questions regarding bills from these companies.  You will be contacted with the lab results as soon as they are available. The fastest way to get your results is to activate your My Chart account. Instructions are located on the last page of this paperwork. If you have not heard from [place] regarding the results in 2 weeks, please contact this office.       Signed,   Merri Ray, MD [facility] [place].  04/20/19 11:36 AM

## 2019-04-19 NOTE — Patient Instructions (Addendum)
    please call your OBGYN for follow up to discuss plan from prior abnormal pap and recommendations, as well as likely repeat exam.   Start januvia once per day for diabetes.   If you have lab work done today you will be contacted with your lab results within the next 2 weeks.  If you have not heard from [place] then please contact us. The fastest way to get your results is to register for My Chart.   IF you received an x-ray today, you will receive an invoice from [facility]. Please contact [facility] at [phone] with questions or concerns regarding your invoice.   IF you received labwork today, you will receive an invoice from [place]. Please contact [facility] at [phone] with questions or concerns regarding your invoice.   Our billing staff will not be able to assist you with questions regarding bills from these companies.  You will be contacted with the lab results as soon as they are available. The fastest way to get your results is to activate your My Chart account. Instructions are located on the last page of this paperwork. If you have not heard from [place] regarding the results in 2 weeks, please contact this office.

## 2019-04-20 ENCOUNTER — Encounter: Payer: Self-pay | Admitting: Family Medicine

## 2019-04-22 LAB — COMPREHENSIVE METABOLIC PANEL
ALT: 43 IU/L — ABNORMAL HIGH (ref 0–32)
AST: 40 IU/L (ref 0–40)
Albumin/Globulin Ratio: 2.2 (ref 1.2–2.2)
Albumin: 5.1 g/dL — ABNORMAL HIGH (ref 3.8–4.9)
Alkaline Phosphatase: 137 IU/L — ABNORMAL HIGH (ref 39–117)
BUN/Creatinine Ratio: 10 (ref 9–23)
BUN: 7 mg/dL (ref 6–24)
Bilirubin Total: 0.4 mg/dL (ref 0.0–1.2)
CO2: 23 mmol/L (ref 20–29)
Calcium: 9.8 mg/dL (ref 8.7–10.2)
Chloride: 94 mmol/L — ABNORMAL LOW (ref 96–106)
Creatinine, Ser: 0.71 mg/dL (ref 0.57–1.00)
GFR calc Af Amer: 112 mL/min/{1.73_m2} (ref >59)
GFR calc non Af Amer: 98 mL/min/{1.73_m2} (ref >59)
Globulin, Total: 2.3 g/dL (ref 1.5–4.5)
Glucose: 160 mg/dL — ABNORMAL HIGH (ref 65–99)
Potassium: 4.1 mmol/L (ref 3.5–5.2)
Sodium: 138 mmol/L (ref 134–144)
Total Protein: 7.4 g/dL (ref 6.0–8.5)

## 2019-04-22 LAB — HEMOGLOBIN A1C
Est. average glucose Bld gHb Est-mCnc: 217 mg/dL
Hgb A1c MFr Bld: 9.2 % — ABNORMAL HIGH (ref 4.8–5.6)

## 2019-05-01 NOTE — Progress Notes (Signed)
Lab letter sent 

## 2019-05-28 ENCOUNTER — Other Ambulatory Visit: Payer: Self-pay | Admitting: Family Medicine

## 2019-05-28 DIAGNOSIS — G6289 Other specified polyneuropathies: Secondary | ICD-10-CM

## 2019-05-28 NOTE — Telephone Encounter (Signed)
Requested Prescriptions  Pending Prescriptions Disp Refills  . gabapentin (NEURONTIN) 300 MG capsule [Pharmacy Med Name: Gabapentin 300 MG Oral Capsule] 180 capsule 1    Sig: TAKE 1-2 CAPSULES  BY MOUTH TWICE DAILY     Neurology: Anticonvulsants - gabapentin Passed - 05/28/2019 10:02 AM      Passed - Valid encounter within last 12 months    Recent Outpatient Visits          1 month ago Type 2 diabetes mellitus with hyperglycemia, without long-term current use of insulin [facility])   [facility] [place], Ranell Patrick, MD   4 months ago Type 2 diabetes mellitus with hyperglycemia, without long-term current use of insulin [facility])   [facility] [place] [facility], Ranell Patrick, MD   7 months ago Hyperlipidemia, unspecified hyperlipidemia type   [facility] [place] [facility], Ranell Patrick, MD   9 months ago Type 2 diabetes mellitus without complication, without long-term current use of insulin [facility])   [facility] [place] [facility], Ranell Patrick, MD   1 year ago Type 2 diabetes mellitus without complication, without long-term current use of insulin [facility])   [facility] [place] [facility], Ranell Patrick, MD      Future Appointments            In 1 month Carlota Raspberry Ranell Patrick, MD [facility] [place], [facility]

## 2019-06-20 ENCOUNTER — Other Ambulatory Visit: Payer: Self-pay | Admitting: Family Medicine

## 2019-06-20 DIAGNOSIS — F418 Other specified anxiety disorders: Secondary | ICD-10-CM

## 2019-07-10 ENCOUNTER — Telehealth: Payer: Self-pay | Admitting: Neurology

## 2019-07-10 NOTE — Telephone Encounter (Signed)
lvm to r/s 11/23 appt  °

## 2019-07-16 ENCOUNTER — Other Ambulatory Visit: Payer: Self-pay | Admitting: Family Medicine

## 2019-07-16 DIAGNOSIS — E785 Hyperlipidemia, unspecified: Secondary | ICD-10-CM

## 2019-07-19 ENCOUNTER — Ambulatory Visit (INDEPENDENT_AMBULATORY_CARE_PROVIDER_SITE_OTHER): Payer: BC Managed Care – PPO | Admitting: Family Medicine

## 2019-07-19 ENCOUNTER — Encounter: Payer: Self-pay | Admitting: Family Medicine

## 2019-07-19 ENCOUNTER — Other Ambulatory Visit: Payer: Self-pay

## 2019-07-19 VITALS — BP 122/81 | HR 78 | Temp 98.4°F | Wt 177.0 lb

## 2019-07-19 DIAGNOSIS — H6690 Otitis media, unspecified, unspecified ear: Secondary | ICD-10-CM | POA: Diagnosis not present

## 2019-07-19 DIAGNOSIS — I10 Essential (primary) hypertension: Secondary | ICD-10-CM

## 2019-07-19 DIAGNOSIS — H938X1 Other specified disorders of right ear: Secondary | ICD-10-CM

## 2019-07-19 DIAGNOSIS — E785 Hyperlipidemia, unspecified: Secondary | ICD-10-CM

## 2019-07-19 DIAGNOSIS — Z23 Encounter for immunization: Secondary | ICD-10-CM | POA: Diagnosis not present

## 2019-07-19 DIAGNOSIS — F418 Other specified anxiety disorders: Secondary | ICD-10-CM

## 2019-07-19 DIAGNOSIS — E1165 Type 2 diabetes mellitus with hyperglycemia: Secondary | ICD-10-CM | POA: Diagnosis not present

## 2019-07-19 LAB — COMPREHENSIVE METABOLIC PANEL
ALT: 49 IU/L — ABNORMAL HIGH (ref 0–32)
AST: 49 IU/L — ABNORMAL HIGH (ref 0–40)
Albumin/Globulin Ratio: 2 (ref 1.2–2.2)
Albumin: 4.8 g/dL (ref 3.8–4.9)
Alkaline Phosphatase: 164 IU/L — ABNORMAL HIGH (ref 39–117)
BUN/Creatinine Ratio: 13 (ref 9–23)
BUN: 9 mg/dL (ref 6–24)
Bilirubin Total: 0.3 mg/dL (ref 0.0–1.2)
CO2: 23 mmol/L (ref 20–29)
Calcium: 10 mg/dL (ref 8.7–10.2)
Chloride: 97 mmol/L (ref 96–106)
Creatinine, Ser: 0.68 mg/dL (ref 0.57–1.00)
GFR calc Af Amer: 115 mL/min/{1.73_m2} (ref >59)
GFR calc non Af Amer: 100 mL/min/{1.73_m2} (ref >59)
Globulin, Total: 2.4 g/dL (ref 1.5–4.5)
Glucose: 163 mg/dL — ABNORMAL HIGH (ref 65–99)
Potassium: 4 mmol/L (ref 3.5–5.2)
Sodium: 137 mmol/L (ref 134–144)
Total Protein: 7.2 g/dL (ref 6.0–8.5)

## 2019-07-19 LAB — HEMOGLOBIN A1C
Est. average glucose Bld gHb Est-mCnc: 148 mg/dL
Hgb A1c MFr Bld: 6.8 % — ABNORMAL HIGH (ref 4.8–5.6)

## 2019-07-19 LAB — LIPID PANEL
Chol/HDL Ratio: 5.9 ratio — ABNORMAL HIGH (ref 0.0–4.4)
Cholesterol, Total: 190 mg/dL (ref 100–199)
HDL: 32 mg/dL — ABNORMAL LOW (ref >39)
LDL Chol Calc (NIH): 73 mg/dL (ref 0–99)
Triglycerides: 540 mg/dL — ABNORMAL HIGH (ref 0–149)
VLDL Cholesterol Cal: 85 mg/dL — ABNORMAL HIGH (ref 5–40)

## 2019-07-19 MED ORDER — SERTRALINE HCL 50 MG PO TABS: ORAL_TABLET | ORAL | 1 refills | Status: AC

## 2019-07-19 MED ORDER — LISINOPRIL-HYDROCHLOROTHIAZIDE 20-25 MG PO TABS: 1.0000 | ORAL_TABLET | Freq: Every day | ORAL | 1 refills | Status: AC

## 2019-07-19 MED ORDER — AMOXICILLIN 875 MG PO TABS: 875.0000 mg | ORAL_TABLET | Freq: Two times a day (BID) | ORAL | 0 refills | Status: DC

## 2019-07-19 MED ORDER — SITAGLIPTIN PHOSPHATE 100 MG PO TABS: 100.0000 mg | ORAL_TABLET | Freq: Every day | ORAL | 1 refills | Status: AC

## 2019-07-19 MED ORDER — ATORVASTATIN CALCIUM 20 MG PO TABS: 20.0000 mg | ORAL_TABLET | Freq: Every day | ORAL | 0 refills | Status: AC

## 2019-07-19 NOTE — Patient Instructions (Addendum)
   Continue Januvia but likely will need to add other medication, possibly a trial of a very low dose of metformin depending on A1c level.  If we do try Metformin and any worsening of diarrhea, will need to pick something different.  No other med changes for now.  Right ear symptoms appear to be due to a possible middle ear infection versus congestion.  Take antibiotic for 10 days as instructed, if any worsening symptoms during that time recheck, or if hearing is not improving with the antibiotic over the next week to 10 days, return for recheck as well.  Return to the clinic or go to the nearest emergency room if any of your symptoms worsen or new symptoms occur.  Thanks for coming in today, follow-up in 3 months  If you have lab work done today you will be contacted with your lab results within the next 2 weeks.  If you have not heard from [place] then please contact us. The fastest way to get your results is to register for My Chart.   IF you received an x-ray today, you will receive an invoice from [facility]. Please contact [facility] at [phone] with questions or concerns regarding your invoice.   IF you received labwork today, you will receive an invoice from [place]. Please contact [facility] at [phone] with questions or concerns regarding your invoice.   Our billing staff will not be able to assist you with questions regarding bills from these companies.  You will be contacted with the lab results as soon as they are available. The fastest way to get your results is to activate your My Chart account. Instructions are located on the last page of this paperwork. If you have not heard from [place] regarding the results in 2 weeks, please contact this office.

## 2019-07-19 NOTE — Progress Notes (Signed)
Subjective:  Patient ID: [name], female    DOB: 1966-02-01  Age: 53 y.o. MRN: [id number]  CC:  Chief Complaint  Patient presents with  . Medical Management of Chronic Issues    3 month f/u on chronic medical condition  . ear pressure    had a bump in ear 1 week ago but then it drained and now feels like im in a tunnal on the right ear. Just want it looked at    HPI [name] presents for   Diabetes: Complicated by hyperglycemia, diabetic neuropathy, treated with gabapentin.  Elevated A1c in August.  Started Januvia 100 mg daily.  Did not tolerate Jardiance due to mycotic infections.  Concerned about Metformin in the past due to chronic diarrhea. Microalbumin: Normal ratio 01/21/2019 Optho, foot exam, pneumovax: Due for ophthalmology exam, last one in May 2019.  Otherwise up-to-date.  Due for flu vaccine. She is on statin and ACE inhibitor. Planned 6 week follow up in August.  Taking januvia once per day.  elevated A1c in August off Jardiance.  Home readings 123-285, but usually 150-200.  No side effects with Januvia.  Gabapentin 372m QAM, 6079mQHS. appt with Neuro on 11/23 for foot pain.    Lab Results  Component Value Date   HGBA1C 9.2 (H) 04/19/2019   HGBA1C 6.5 (H) 01/21/2019   HGBA1C 6.6 (H) 08/22/2018   Lab Results  Component Value Date   LDLCALC 68 01/21/2019   CREATININE 0.71 04/19/2019   Hypertension: Lisinopril HCTZ 20/25 mg daily.  No new side effects.  BP Readings from Last 3 Encounters:  07/19/19 122/81  04/19/19 124/81  10/03/18 116/78   Lab Results  Component Value Date   CREATININE 0.71 04/19/2019   Hyperlipidemia: Lipitor 20 mg daily.  Borderline elevated ALT, alk phos previously. No new myalgias.daily dosing.  Lab Results  Component Value Date   CHOL 180 01/21/2019   HDL 35 (L) 01/21/2019   LDLCALC 68 01/21/2019   TRIG 387 (H) 01/21/2019   CHOLHDL 5.1 (H) 01/21/2019   Lab Results  Component Value Date   ALT 43 (H)  04/19/2019   AST 40 04/19/2019   ALKPHOS 137 (H) 04/19/2019   BILITOT 0.4 04/19/2019   Depression:  [facility] 07/19/2019 04/19/2019 01/18/2019 10/03/2018 08/22/2018  Decreased Interest 0 0 0 0 0  Down, Depressed, Hopeless 0 0 0 0 0  PHQ - 2 Score 0 0 0 0 0  Altered sleeping - - - - -  Tired, decreased energy - - - - -  Change in appetite - - - - -  Feeling bad or failure about yourself  - - - - -  Trouble concentrating - - - - -  Moving slowly or fidgety/restless - - - - -  Suicidal thoughts - - - - -  PHQ-9 Score - - - - -  sertraline working well. No new side effects.    Ear pain/pressure Bump inside R ear few weeks ago.  Brownish d/c and blood after pooped last week. R ear has been stopped up since. No fever. Less sore, but feels like in a tunnel.  Tx: sweet oil. Earwax kit with suction.    History Patient Active Problem List   Diagnosis Date Noted  . Yeast vaginitis 09/09/2017  . Community acquired pneumonia of left lower lobe of lung 09/01/2017  . Moderate persistent asthma with acute exacerbation 09/01/2017  . Paresthesias 08/01/2017  . LGSIL Pap smear of vagina  11/19/2013  . 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 Asthma   . Diabetes mellitus without complication ([place])   . Hyperlipemia   . Hypertension   . Seizures ([place])    Past Surgical History:  Procedure Laterality Date  . ABDOMINAL HYSTERECTOMY    . CHOLECYSTECTOMY     Allergies  Allergen Reactions  . Contrast Media [Iodinated Diagnostic Agents] Anaphylaxis    Contrast Dye   . Codeine   . Iohexol      Desc: UNABLE TO BREATH   . Other Other (See Comments)    Cashews and pistachios; reaction unknown   Prior to Admission medications   Medication Sig Start Date End Date Taking? Authorizing Provider  ACCU-CHEK AVIVA PLUS test strip USE  STRIP TO CHECK GLUCOSE ONCE DAILY 08/01/18  Yes [name], MD  Accu-Chek Softclix Lancets  lancets USE   TO CHECK GLUCOSE ONCE DAILY 06/20/19  Yes [name], MD  atorvastatin (LIPITOR) 20 MG tablet Take 1 tablet by mouth once daily 07/16/19  Yes [name], MD  blood glucose meter kit and supplies Dispense based on patient and insurance preference. Use once per day. 01/01/18  Yes [name], MD  esomeprazole ([place]) 20 MG packet Take 20 mg by mouth daily before breakfast.   Yes [provider]  gabapentin (NEURONTIN) 300 MG capsule TAKE 1-2 CAPSULES  BY MOUTH TWICE DAILY 05/28/19  Yes [name], MD  levETIRAcetam (KEPPRA) 500 MG tablet TAKE ONE TABLET BY MOUTH IN THE MORNING AND TWO TABLETS AT BEDTIME 08/01/18  Yes Dennie Bible, NP  lisinopril-hydrochlorothiazide (ZESTORETIC) 20-25 MG tablet Take 1 tablet by mouth daily. 03/28/19  Yes [name], MD  Multiple Vitamin (MULTIVITAMIN) tablet Take 1 tablet by mouth daily.   Yes [provider]  sertraline (ZOLOFT) 50 MG tablet TAKE 1 TABLET BY MOUTH ONCE DAILY. KEEP UPCOMING APPT 06/20/19  Yes [name], MD  sitaGLIPtin (JANUVIA) 100 MG tablet Take 1 tablet (100 mg total) by mouth daily. 04/19/19  Yes [name], MD   Social History   Socioeconomic History  . Marital status: Widowed    Spouse name: Not on file  . Number of children: 2  . Years of education: 68  . Highest education level: Not on file  Occupational History  . Not on file  Social Needs  . Financial resource strain: Not on file  . Food insecurity    Worry: Not on file    Inability: Not on file  . Transportation needs    Medical: Not on file    Non-medical: Not on file  Tobacco Use  . Smoking status: Former Research scientist (life sciences)  . Smokeless tobacco: Never Used  Substance and Sexual Activity  . Alcohol use: Yes    Alcohol/week: 2.0 standard drinks    Types: 2 Cans of beer per week    Comment: beer, liquor  . Drug use: No  . Sexual activity: Yes    Birth control/protection: Surgical  Lifestyle  .  Physical activity    Days per week: Not on file    Minutes per session: Not on file  . Stress: Not on file  Relationships  . Social Herbalist on phone: Not on file    Gets together: Not on file    Attends religious service: Not on file    Active member of club or organization: Not on file  Attends meetings of clubs or organizations: Not on file    Relationship status: Not on file  . Intimate partner violence    Fear of current or ex partner: Not on file    Emotionally abused: Not on file    Physically abused: Not on file    Forced sexual activity: Not on file  Other Topics Concern  . Not on file  Social History Narrative   Patient is widowed.   Patient has 2 children.   Patient is currently unemployed.   Patient has a high school education.   Patient is right-handed.   Patient drinks 2 glass of caffeine daily ( soda and tea).    Review of Systems  Constitutional: Negative for fatigue and unexpected weight change.  Respiratory: Negative for chest tightness and shortness of breath.   Cardiovascular: Negative for chest pain, palpitations and leg swelling.  Gastrointestinal: Negative for abdominal pain and blood in stool.  Neurological: Negative for dizziness, syncope, light-headedness and headaches.     Objective:   Vitals:   07/19/19 0954  BP: 122/81  Pulse: 78  Temp: 98.4 F (36.9 C)  TempSrc: Oral  SpO2: 97%  Weight: 177 lb (80.3 kg)     Physical Exam Vitals signs reviewed.  Constitutional:      Appearance: She is well-developed.  HENT:     Head: Normocephalic and atraumatic.     Right Ear: Ear canal and external ear normal. Decreased hearing noted. A middle ear effusion (light yellow/min clouding. ) is present. Tympanic membrane is injected.     Left Ear: Tympanic membrane, ear canal and external ear normal.     Ears:     Weber exam findings: lateralizes right.    Right Rinne: BC > AC. Eyes:     Conjunctiva/sclera: Conjunctivae normal.      Pupils: Pupils are equal, round, and reactive to light.  Neck:     Vascular: No carotid bruit.  Cardiovascular:     Rate and Rhythm: Normal rate and regular rhythm.     Heart sounds: Normal heart sounds.  Pulmonary:     Effort: Pulmonary effort is normal.     Breath sounds: Normal breath sounds.  Abdominal:     Palpations: Abdomen is soft. There is no pulsatile mass.     Tenderness: There is no abdominal tenderness.  Skin:    General: Skin is warm and dry.  Neurological:     Mental Status: She is alert and oriented to person, place, and time.  Psychiatric:        Behavior: Behavior normal.      Assessment & Plan:  [name] is a 53 y.o. female . Type 2 diabetes mellitus with hyperglycemia, without long-term current use of insulin (HCC) - Plan: Hemoglobin A1c, Comprehensive metabolic panel, sitaGLIPtin (JANUVIA) 100 MG tablet  -Tolerating Januvia, but home readings are uncontrolled.  Likely will need additional agent.  Check A1c.  Could consider low-dose Metformin but would be cautious with history of chronic diarrhea.  Unfortunately did not tolerate SGLT2.  Also complicated by peripheral neuropathy, will be following up with neurology.  Continue gabapentin for now.  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  Possible mild acute otitis media with effusion.  Start amoxicillin, RTC precautions possible ENT eval precautions discussed  Hyperlipidemia, unspecified hyperlipidemia type - Plan: Comprehensive metabolic panel, Lipid Panel, atorvastatin (LIPITOR) 20 MG tablet  -  Stable, tolerating current regimen. Medications refilled. Labs pending as above.   Depression with anxiety - Plan: sertraline (ZOLOFT) 50 MG  tablet  -Stable, continue Zoloft.  Essential hypertension - Plan: Comprehensive metabolic panel, lisinopril-hydrochlorothiazide (ZESTORETIC) 20-25 MG tablet   Stable, tolerating current regimen. Medications refilled. Labs pending as above.    No orders of the defined types were placed in this encounter.  Patient Instructions     Continue Januvia but likely will need to add other medication, possibly a trial of a very low dose of metformin depending on A1c level.  If we do try Metformin and any worsening of diarrhea, will need to pick something different.  No other med changes for now.  Right ear symptoms appear to be due to a possible middle ear infection versus congestion.  Take antibiotic for 10 days as instructed, if any worsening symptoms during that time recheck, or if hearing is not improving with the antibiotic over the next week to 10 days, return for recheck as well.  Return to the clinic or go to the nearest emergency room if any of your symptoms worsen or new symptoms occur.  Thanks for coming in today, follow-up in 3 months  If you have lab work done today you will be contacted with your lab results within the next 2 weeks.  If you have not heard from [place] then please contact us. The fastest way to get your results is to register for My Chart.   IF you received an x-ray today, you will receive an invoice from [facility]. Please contact [facility] at [phone] with questions or concerns regarding your invoice.   IF you received labwork today, you will receive an invoice from [facility]. Please contact [facility] at [phone] with questions or concerns regarding your invoice.   Our billing staff will not be able to assist you with questions regarding bills from these companies.  You will be contacted with the lab results as soon as they are available. The fastest way to get your results is to activate your My Chart account. Instructions are located on the  last page of this paperwork. If you have not heard from [place] regarding the results in 2 weeks, please contact this office.          Signed, Merri Ray, MD [facility]

## 2019-07-29 ENCOUNTER — Encounter: Payer: Self-pay | Admitting: Radiology

## 2019-07-30 ENCOUNTER — Other Ambulatory Visit: Payer: Self-pay | Admitting: *Deleted

## 2019-07-30 MED ORDER — LEVETIRACETAM 500 MG PO TABS: ORAL_TABLET | ORAL | 3 refills | Status: DC

## 2019-08-05 ENCOUNTER — Ambulatory Visit: Payer: BC Managed Care – PPO | Admitting: Family Medicine

## 2019-08-05 ENCOUNTER — Ambulatory Visit: Payer: BLUE CROSS/BLUE SHIELD | Admitting: Neurology

## 2019-08-05 ENCOUNTER — Other Ambulatory Visit: Payer: Self-pay

## 2019-08-05 ENCOUNTER — Encounter: Payer: Self-pay | Admitting: Family Medicine

## 2019-08-05 VITALS — BP 128/88 | HR 83 | Temp 97.4°F | Ht 67.0 in | Wt 178.0 lb

## 2019-08-05 DIAGNOSIS — G40309 Generalized idiopathic epilepsy and epileptic syndromes, not intractable, without status epilepticus: Secondary | ICD-10-CM | POA: Diagnosis not present

## 2019-08-05 MED ORDER — LEVETIRACETAM 1000 MG PO TABS: 1000.0000 mg | ORAL_TABLET | Freq: Two times a day (BID) | ORAL | 3 refills | Status: AC
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
## 2019-08-05 NOTE — Patient Instructions (Signed)
We will increase levetiracetam to 1000mg  twice daily  Monitor for seizures, caution driving for the next 3 months    According to [place] law, you can not drive unless you are seizure / syncope free for at least 6 months and under physician's care.  Please maintain precautions. Do not participate in activities where a loss of awareness could harm you or someone else. No swimming alone, no tub bathing, no hot tubs, no driving, no operating motorized vehicles (cars, ATVs, motocycles, etc), lawnmowers, power tools or firearms. No standing at heights, such as rooftops, ladders or stairs. Avoid hot objects such as stoves, heaters, open fires. Wear a helmet when riding a bicycle, scooter, skateboard, etc. and avoid areas of traffic. Set your water heater to 120 degrees or less.    Close follow up with Dr  Follow up with me in 6 months, sooner if needed   Seizure, Adult A seizure is a sudden burst of abnormal electrical activity in the brain. Seizures usually last from 30 seconds to 2 minutes. They can cause many different symptoms. Usually, seizures are not harmful unless they last a long time. What are the causes? Common causes of this condition include:  Fever or infection.  Conditions that affect the brain, such as: ? A brain abnormality that you were born with. ? A brain or head injury. ? Bleeding in the brain. ? A tumor. ? Stroke. ? Brain disorders such as autism or cerebral palsy.  Low blood sugar.  Conditions that are passed from parent to child (are inherited).  Problems with substances, such as: ? Having a reaction to a drug or a medicine. ? Suddenly stopping the use of a substance (withdrawal). In some cases, the cause may not be known. A person who has repeated seizures over time without a clear cause has a condition called epilepsy. What increases the risk? You are more likely to get this condition if you have:  A family history of epilepsy.  Had a seizure in the  past.  A brain disorder.  A history of head injury, lack of oxygen at birth, or strokes. What are the signs or symptoms? There are many types of seizures. The symptoms vary depending on the type of seizure you have. Examples of symptoms during a seizure include:  Shaking (convulsions).  Stiffness in the body.  Passing out (losing consciousness).  Head nodding.  Staring.  Not responding to sound or touch.  Loss of bladder control and bowel control. Some people have symptoms right before and right after a seizure happens. Symptoms before a seizure may include:  Fear.  Worry (anxiety).  Feeling like you may vomit (nauseous).  Feeling like the room is spinning (vertigo).  Feeling like you saw or heard something before (dj vu).  Odd tastes or smells.  Changes in how you see. You may see flashing lights or spots. Symptoms after a seizure happens can include:  Confusion.  Sleepiness.  Headache.  Weakness on one side of the body. How is this treated? Most seizures will stop on their own in under 5 minutes. In these cases, no treatment is needed. Seizures that last longer than 5 minutes will usually need treatment. Treatment can include:  Medicines given through an IV tube.  Avoiding things that are known to cause your seizures. These can include medicines that you take for another condition.  Medicines to treat epilepsy.  Surgery to stop the seizures. This may be needed if medicines do not help. Follow these  instructions at home: Medicines  Take over-the-counter and prescription medicines only as told by your doctor.  Do not eat or drink anything that may keep your medicine from working, such as alcohol. Activity  Do not do any activities that would be dangerous if you had another seizure, like driving or swimming. Wait until your doctor says it is safe for you to do them.  If you live in the [place] ask your local DMV (department of motor vehicles) when you  can drive.  Get plenty of rest. Teaching others Teach friends and family what to do when you have a seizure. They should:  Lay you on the ground.  Protect your head and body.  Loosen any tight clothing around your neck.  Turn you on your side.  Not hold you down.  Not put anything into your mouth.  Know whether or not you need emergency care.  Stay with you until you are better.  General instructions  Contact your doctor each time you have a seizure.  Avoid anything that gives you seizures.  Keep a seizure diary. Write down: ? What you think caused each seizure. ? What you remember about each seizure.  Keep all follow-up visits as told by your doctor. This is important. Contact a doctor if:  You have another seizure.  You have seizures more often.  There is any change in what happens during your seizures.  You keep having seizures with treatment.  You have symptoms of being sick or having an infection. Get help right away if:  You have a seizure that: ? Lasts longer than 5 minutes. ? Is different than seizures you had before. ? Makes it harder to breathe. ? Happens after you hurt your head.  You have any of these symptoms after a seizure: ? Not being able to speak. ? Not being able to use a part of your body. ? Confusion. ? A bad headache.  You have two or more seizures in a row.  You do not wake up right after a seizure.  You get hurt during a seizure. These symptoms may be an emergency. Do not wait to see if the symptoms will go away. Get medical help right away. Call your local emergency services (911 in the [place] Do not drive yourself to the hospital. Summary  Seizures usually last from 30 seconds to 2 minutes. Usually, they are not harmful unless they last a long time.  Do not eat or drink anything that may keep your medicine from working, such as alcohol.  Teach friends and family what to do when you have a seizure.  Contact your doctor  each time you have a seizure. This information is not intended to replace advice given to you by your health care provider. Make sure you discuss any questions you have with your health care provider. Document Released: 02/15/2008 Document Revised: 11/16/2018 Document Reviewed: 11/16/2018 Elsevier Patient Education  [place].

## 2019-10-18 ENCOUNTER — Ambulatory Visit: Payer: BLUE CROSS/BLUE SHIELD | Admitting: Family Medicine

## 2020-02-03 ENCOUNTER — Ambulatory Visit: Payer: BLUE CROSS/BLUE SHIELD | Admitting: Family Medicine

## 2020-09-08 ENCOUNTER — Other Ambulatory Visit: Payer: Self-pay | Admitting: Family Medicine

## 2020-09-08 DIAGNOSIS — Z1231 Encounter for screening mammogram for malignant neoplasm of breast: Secondary | ICD-10-CM
